# Patient Record
Sex: Male | Born: 1985 | Race: White | Hispanic: No
Health system: Midwestern US, Community
[De-identification: ages and names within clinical notes are randomized; demographics above are authoritative.]

## PROBLEM LIST (undated history)

## (undated) DIAGNOSIS — I639 Cerebral infarction, unspecified: Secondary | ICD-10-CM

## (undated) DIAGNOSIS — I10 Essential (primary) hypertension: Principal | ICD-10-CM

## (undated) DIAGNOSIS — M24419 Recurrent dislocation, unspecified shoulder: Secondary | ICD-10-CM

## (undated) DIAGNOSIS — Z87898 Personal history of other specified conditions: Secondary | ICD-10-CM

## (undated) DIAGNOSIS — F609 Personality disorder, unspecified: Secondary | ICD-10-CM

## (undated) DIAGNOSIS — R569 Unspecified convulsions: Secondary | ICD-10-CM

## (undated) DIAGNOSIS — R413 Other amnesia: Secondary | ICD-10-CM

## (undated) DIAGNOSIS — Z765 Malingerer [conscious simulation]: Secondary | ICD-10-CM

## (undated) DIAGNOSIS — Z8669 Personal history of other diseases of the nervous system and sense organs: Secondary | ICD-10-CM

## (undated) DIAGNOSIS — F4310 Post-traumatic stress disorder, unspecified: Secondary | ICD-10-CM

## (undated) DIAGNOSIS — I2510 Atherosclerotic heart disease of native coronary artery without angina pectoris: Secondary | ICD-10-CM

## (undated) DIAGNOSIS — F1910 Other psychoactive substance abuse, uncomplicated: Secondary | ICD-10-CM

## (undated) HISTORY — PX: APPENDECTOMY: SHX54

---

## 2007-05-31 ENCOUNTER — Emergency Department: Payer: Self-pay

## 2007-06-29 ENCOUNTER — Emergency Department (HOSPITAL_COMMUNITY): Admission: EM | Admit: 2007-06-29 | Discharge: 2007-06-29 | Payer: Self-pay | Admitting: Emergency Medicine

## 2009-01-01 ENCOUNTER — Emergency Department (HOSPITAL_COMMUNITY): Admission: EM | Admit: 2009-01-01 | Discharge: 2009-01-01 | Payer: Self-pay | Admitting: Emergency Medicine

## 2009-01-26 ENCOUNTER — Emergency Department (HOSPITAL_COMMUNITY): Admission: EM | Admit: 2009-01-26 | Discharge: 2009-01-27 | Payer: Self-pay | Admitting: Emergency Medicine

## 2009-01-26 ENCOUNTER — Emergency Department (HOSPITAL_COMMUNITY): Admission: EM | Admit: 2009-01-26 | Discharge: 2009-01-26 | Payer: Self-pay | Admitting: Emergency Medicine

## 2009-02-23 ENCOUNTER — Emergency Department (HOSPITAL_COMMUNITY): Admission: EM | Admit: 2009-02-23 | Discharge: 2009-02-24 | Payer: Self-pay | Admitting: Emergency Medicine

## 2009-03-14 ENCOUNTER — Other Ambulatory Visit: Payer: Self-pay | Admitting: Emergency Medicine

## 2009-03-15 ENCOUNTER — Inpatient Hospital Stay (HOSPITAL_COMMUNITY): Admission: AD | Admit: 2009-03-15 | Discharge: 2009-03-16 | Payer: Self-pay | Admitting: Psychiatry

## 2009-03-15 ENCOUNTER — Ambulatory Visit: Payer: Self-pay | Admitting: Psychiatry

## 2009-03-16 ENCOUNTER — Emergency Department (HOSPITAL_COMMUNITY): Admission: EM | Admit: 2009-03-16 | Discharge: 2009-03-16 | Payer: Self-pay | Admitting: Emergency Medicine

## 2009-03-17 ENCOUNTER — Other Ambulatory Visit: Payer: Self-pay | Admitting: Emergency Medicine

## 2009-03-18 ENCOUNTER — Other Ambulatory Visit: Payer: Self-pay | Admitting: Emergency Medicine

## 2009-03-18 ENCOUNTER — Inpatient Hospital Stay (HOSPITAL_COMMUNITY): Admission: AD | Admit: 2009-03-18 | Discharge: 2009-03-23 | Payer: Self-pay | Admitting: Psychiatry

## 2009-04-12 ENCOUNTER — Emergency Department (HOSPITAL_COMMUNITY): Admission: EM | Admit: 2009-04-12 | Discharge: 2009-04-12 | Payer: Self-pay | Admitting: Emergency Medicine

## 2009-09-16 ENCOUNTER — Emergency Department (HOSPITAL_COMMUNITY): Admission: EM | Admit: 2009-09-16 | Discharge: 2009-09-16 | Payer: Self-pay | Admitting: Emergency Medicine

## 2010-11-05 LAB — POCT I-STAT, CHEM 8
BUN: 18 mg/dL (ref 6–23)
BUN: 12 mg/dL (ref 6–23)
Chloride: 105 mEq/L (ref 96–112)
Creatinine, Ser: 1 mg/dL (ref 0.4–1.5)
Creatinine, Ser: 1.1 mg/dL (ref 0.4–1.5)
HCT: 43 % (ref 39.0–52.0)
Hemoglobin: 14.6 g/dL (ref 13.0–17.0)
Potassium: 3.8 mEq/L (ref 3.5–5.1)
Sodium: 137 mEq/L (ref 135–145)

## 2010-11-05 LAB — ETHANOL: Alcohol, Ethyl (B): <5 mg/dL (ref 0–10)

## 2010-11-06 LAB — CBC
HCT: 42.7 % (ref 39.0–52.0)
Hemoglobin: 14.5 g/dL (ref 13.0–17.0)
Hemoglobin: 15 g/dL (ref 13.0–17.0)
MCHC: 34 g/dL (ref 30.0–36.0)
MCHC: 34.4 g/dL (ref 30.0–36.0)
MCV: 93.8 fL (ref 78.0–100.0)
Platelets: 196 10*3/uL (ref 150–400)
Platelets: 210 10*3/uL (ref 150–400)
RBC: 4.55 MIL/uL (ref 4.22–5.81)
RDW: 13.5 % (ref 11.5–15.5)
RDW: 14.1 % (ref 11.5–15.5)
WBC: 5.7 K/uL (ref 4.0–10.5)
WBC: 8.1 10*3/uL (ref 4.0–10.5)

## 2010-11-06 LAB — URINALYSIS, ROUTINE W REFLEX MICROSCOPIC
Bilirubin Urine: NEGATIVE
Hgb urine dipstick: NEGATIVE
Ketones, ur: NEGATIVE mg/dL
Nitrite: NEGATIVE
Specific Gravity, Urine: 1.03 (ref 1.005–1.030)
pH: 5.5 (ref 5.0–8.0)

## 2010-11-06 LAB — GLUCOSE, CAPILLARY: Glucose-Capillary: 99 mg/dL (ref 70–99)

## 2010-11-06 LAB — BASIC METABOLIC PANEL
BUN: 13 mg/dL (ref 6–23)
Chloride: 107 mEq/L (ref 96–112)
Glucose, Bld: 110 mg/dL — ABNORMAL HIGH (ref 70–99)
Potassium: 4 mEq/L (ref 3.5–5.1)
Sodium: 138 mEq/L (ref 135–145)

## 2010-11-06 LAB — RAPID URINE DRUG SCREEN, HOSP PERFORMED
Amphetamines: NOT DETECTED
Amphetamines: NOT DETECTED
Barbiturates: NOT DETECTED
Barbiturates: NOT DETECTED
Benzodiazepines: NOT DETECTED
Benzodiazepines: POSITIVE — AB
Cocaine: NOT DETECTED
Opiates: NOT DETECTED
Tetrahydrocannabinol: NOT DETECTED

## 2010-11-06 LAB — COMPREHENSIVE METABOLIC PANEL
ALT: 16 U/L (ref 0–53)
CO2: 26 mEq/L (ref 19–32)
Chloride: 108 mEq/L (ref 96–112)
Glucose, Bld: 96 mg/dL (ref 70–99)
Potassium: 3.9 mEq/L (ref 3.5–5.1)
Sodium: 140 mEq/L (ref 135–145)
Total Bilirubin: 0.1 mg/dL — ABNORMAL LOW (ref 0.3–1.2)

## 2010-11-06 LAB — BASIC METABOLIC PANEL WITH GFR
CO2: 24 meq/L (ref 19–32)
Calcium: 9.1 mg/dL (ref 8.4–10.5)
Creatinine, Ser: 1.21 mg/dL (ref 0.4–1.5)

## 2010-11-06 LAB — DIFFERENTIAL
Basophils Relative: 0 % (ref 0–1)
Eosinophils Relative: 2 % (ref 0–5)
Lymphocytes Relative: 29 % (ref 12–46)
Lymphs Abs: 2.3 10*3/uL (ref 0.7–4.0)
Monocytes Relative: 7 % (ref 3–12)
Neutro Abs: 5 10*3/uL (ref 1.7–7.7)

## 2010-11-06 LAB — ETHANOL
Alcohol, Ethyl (B): <5 mg/dL (ref 0–10)
Alcohol, Ethyl (B): <5 mg/dL (ref 0–10)

## 2010-11-06 LAB — SALICYLATE LEVEL: Salicylate Lvl: <4 mg/dL (ref 2.8–20.0)

## 2010-11-07 LAB — POCT I-STAT, CHEM 8
BUN: 10 mg/dL (ref 6–23)
Chloride: 107 mEq/L (ref 96–112)
Creatinine, Ser: 1 mg/dL (ref 0.4–1.5)
Hemoglobin: 16 g/dL (ref 13.0–17.0)

## 2010-11-07 LAB — DIFFERENTIAL
Basophils Absolute: 0 10*3/uL (ref 0.0–0.1)
Eosinophils Absolute: 0.1 10*3/uL (ref 0.0–0.7)
Lymphs Abs: 1.9 10*3/uL (ref 0.7–4.0)
Monocytes Absolute: 0.6 10*3/uL (ref 0.1–1.0)
Monocytes Relative: 10 % (ref 3–12)

## 2010-11-07 LAB — RAPID URINE DRUG SCREEN, HOSP PERFORMED
Amphetamines: NOT DETECTED
Opiates: NOT DETECTED
Tetrahydrocannabinol: POSITIVE — AB

## 2010-11-07 LAB — CBC
HCT: 44 % (ref 39.0–52.0)
Hemoglobin: 15 g/dL (ref 13.0–17.0)
MCV: 93.2 fL (ref 78.0–100.0)
WBC: 5.6 10*3/uL (ref 4.0–10.5)

## 2010-11-07 LAB — ETHANOL: Alcohol, Ethyl (B): <5 mg/dL (ref 0–10)

## 2010-11-08 LAB — POCT CARDIAC MARKERS
CKMB, poc: <1 ng/mL — ABNORMAL LOW (ref 1.0–8.0)
Myoglobin, poc: 74.9 ng/mL (ref 12–200)
Troponin i, poc: <0.05 ng/mL (ref 0.00–0.09)

## 2010-11-08 LAB — DIFFERENTIAL
Eosinophils Absolute: 0.1 10*3/uL (ref 0.0–0.7)
Eosinophils Relative: 2 % (ref 0–5)
Lymphocytes Relative: 38 % (ref 12–46)
Lymphs Abs: 2.5 10*3/uL (ref 0.7–4.0)
Monocytes Relative: 6 % (ref 3–12)

## 2010-11-08 LAB — COMPREHENSIVE METABOLIC PANEL
ALT: 20 U/L (ref 0–53)
AST: 15 U/L (ref 0–37)
Albumin: 4.5 g/dL (ref 3.5–5.2)
Calcium: 9.2 mg/dL (ref 8.4–10.5)
GFR calc Af Amer: >60 mL/min (ref >60)
Sodium: 138 mEq/L (ref 135–145)
Total Protein: 7.6 g/dL (ref 6.0–8.3)

## 2010-11-08 LAB — PROTIME-INR: INR: 1 (ref 0.00–1.49)

## 2010-11-08 LAB — CBC
MCHC: 34.6 g/dL (ref 30.0–36.0)
Platelets: 205 10*3/uL (ref 150–400)
RBC: 4.89 MIL/uL (ref 4.22–5.81)
RDW: 13.5 % (ref 11.5–15.5)

## 2010-11-08 LAB — RAPID URINE DRUG SCREEN, HOSP PERFORMED
Barbiturates: NOT DETECTED
Opiates: POSITIVE — AB
Tetrahydrocannabinol: NOT DETECTED

## 2010-12-14 NOTE — H&P (Signed)
Alex Sandoval, CANSLER NO.:  [id number]   MEDICAL RECORD NO.:  [id number]          PATIENT TYPE:  IPS   LOCATION:  0503                          FACILITY:  [facility]   PHYSICIAN:  Geoffery Lyons, M.D.      DATE OF BIRTH:  September 03, 1985   DATE OF ADMISSION:  03/18/2009  DATE OF DISCHARGE:                       PSYCHIATRIC ADMISSION ASSESSMENT   This is a 25 year old male, involuntarily petitioned on March 18, 2009.   HISTORY OF PRESENT ILLNESS:  The patient is here on the petition with  papers stating that the patient is 25 year old male, presenting to the  emergency department after an overdose of Seroquel in attempted suicide.  The patient also abuses drugs and is considered a danger to himself and  others.  The patient states that he is here today, as he states he took  some extra Seroquel only to sleep.  His girlfriend found him, had  difficulty awakening him, and was taken to the emergency department.  The patient does states that he was here recently, discharged in the  middle of August.  He was here for some alcohol and drug use at that  time.  He states he immediately relapsed.  He states drug dealer was  outside and again relapsed on alcohol and cocaine.   PAST PSYCHIATRIC HISTORY:  The patient was in our facility on March 15, 2009, discharged shortly afterwards, again relapsed immediately.   SOCIAL HISTORY:  He is a 25 year old male.  He states he has a  girlfriend.  The patient was in the [employer], but states he was  discharged.  He is unemployed.  He is a Engineer, agricultural.   FAMILY HISTORY:  Unknown.   ALCOHOL AND DRUG HISTORY:  As above, relapsed on substances.   PRIMARY CARE Draya Felker:  None.   MEDICAL PROBLEMS:  Denies any acute or chronic health issues.   MEDICATIONS:  He was discharged on Seroquel and lithium, which he felt  was beneficial to help him with his bipolar disorder.  He is anxious to  get back on his medications.   DRUG ALLERGIES:   No known allergies.   PHYSICAL EXAMINATION:  GENERAL:  This is a healthy-appearing male, fully  assessed at [facility] Emergency Department.  VITAL SIGNS:  Show a temperature of 98.4, heart rate 112, respirations  18, blood pressure is 139/85.   LABORATORY DATA:  Shows a CBC within normal limits.  Alcohol level less  than 5.  C-MET within normal limits.  Lithium level was less than 0.25.  Urine drug screen is positive for benzodiazepines, positive for THC.  Urinalysis shows a WBC count of 7 to 10.   MENTAL STATUS EXAMINATION:  The patient is fully alert and cooperative,  neat in appearance, casually dressed, good eye contact.  Speech is  clear, normal pace and tone.  The patient's mood is neutral.  He is  anxious to discuss the situation and get back on his medications.  He is  pleasant, wanting to get help.  Thought processes are coherent, goal  directed.  Cognitive function intact.  His memory appears intact.  Judgment and  insight appear to be good.   DIAGNOSES:  AXIS I:  Polysubstance abuse, rule out dependence.  Mood  disorder, rule out bipolar.  AXIS II:  Deferred.  AXIS III:  No known medical conditions.  AXIS IV:  Deferred at this time.  AXIS V:  Current is 35 to 40.   PLAN:  Contract for safety.  Will work on relapse prevention.  We will  continue with the patient's lithium and Seroquel.  Reinforce med  compliance.  We will have Librium available on a p.r.n. basis for any  withdrawal symptoms.  Will continue to identify his support group and  assess the patient's potential for rehab.  His tentative length of stay  is 3 to 5 days.      Landry Corporal, N.P.      Geoffery Lyons, M.D.  Electronically Signed    JO/MEDQ  D:  03/20/2009  T:  03/20/2009  Job:  [id number]
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
## 2010-12-24 ENCOUNTER — Emergency Department (HOSPITAL_COMMUNITY)
Admission: EM | Admit: 2010-12-24 | Discharge: 2010-12-25 | Discharge status: Against Medical Advice | Payer: Self-pay | Attending: Emergency Medicine | Admitting: Emergency Medicine

## 2010-12-24 DIAGNOSIS — G7109 Other specified muscular dystrophies: Secondary | ICD-10-CM | POA: Insufficient documentation

## 2011-09-03 ENCOUNTER — Emergency Department (HOSPITAL_COMMUNITY): Payer: Self-pay

## 2011-09-03 ENCOUNTER — Emergency Department (HOSPITAL_COMMUNITY)
Admission: EM | Admit: 2011-09-03 | Discharge: 2011-09-03 | Disposition: A | Discharge status: Home / Self Care | Payer: Self-pay | Attending: Emergency Medicine | Admitting: Emergency Medicine

## 2011-09-03 ENCOUNTER — Encounter (HOSPITAL_COMMUNITY): Payer: Self-pay | Admitting: *Deleted

## 2011-09-03 DIAGNOSIS — Z79899 Other long term (current) drug therapy: Secondary | ICD-10-CM | POA: Insufficient documentation

## 2011-09-03 DIAGNOSIS — F10929 Alcohol use, unspecified with intoxication, unspecified: Secondary | ICD-10-CM

## 2011-09-03 DIAGNOSIS — F1010 Alcohol abuse, uncomplicated: Secondary | ICD-10-CM | POA: Insufficient documentation

## 2011-09-03 DIAGNOSIS — R45851 Suicidal ideations: Secondary | ICD-10-CM | POA: Insufficient documentation

## 2011-09-03 DIAGNOSIS — Z046 Encounter for general psychiatric examination, requested by authority: Secondary | ICD-10-CM | POA: Insufficient documentation

## 2011-09-03 DIAGNOSIS — F39 Unspecified mood [affective] disorder: Secondary | ICD-10-CM

## 2011-09-03 DIAGNOSIS — F17200 Nicotine dependence, unspecified, uncomplicated: Secondary | ICD-10-CM | POA: Insufficient documentation

## 2011-09-03 DIAGNOSIS — F419 Anxiety disorder, unspecified: Secondary | ICD-10-CM

## 2011-09-03 DIAGNOSIS — F411 Generalized anxiety disorder: Secondary | ICD-10-CM | POA: Insufficient documentation

## 2011-09-03 DIAGNOSIS — F4310 Post-traumatic stress disorder, unspecified: Secondary | ICD-10-CM | POA: Insufficient documentation

## 2011-09-03 DIAGNOSIS — R45850 Homicidal ideations: Secondary | ICD-10-CM | POA: Insufficient documentation

## 2011-09-03 HISTORY — DX: Post-traumatic stress disorder, unspecified: F43.10

## 2011-09-03 HISTORY — DX: Unspecified convulsions: R56.9

## 2011-09-03 LAB — CBC
HCT: 42.4 % (ref 39.0–52.0)
Hemoglobin: 14.9 g/dL (ref 13.0–17.0)
MCV: 89.5 fL (ref 78.0–100.0)
RDW: 14 % (ref 11.5–15.5)
WBC: 15.5 10*3/uL — ABNORMAL HIGH (ref 4.0–10.5)

## 2011-09-03 LAB — DIFFERENTIAL
Basophils Absolute: 0.1 10*3/uL (ref 0.0–0.1)
Eosinophils Relative: 1 % (ref 0–5)
Lymphocytes Relative: 17 % (ref 12–46)
Lymphs Abs: 2.6 10*3/uL (ref 0.7–4.0)
Monocytes Absolute: 0.7 10*3/uL (ref 0.1–1.0)
Monocytes Relative: 5 % (ref 3–12)
Neutro Abs: 11.9 10*3/uL — ABNORMAL HIGH (ref 1.7–7.7)

## 2011-09-03 LAB — ETHANOL: Alcohol, Ethyl (B): 141 mg/dL — ABNORMAL HIGH (ref 0–11)

## 2011-09-03 LAB — BASIC METABOLIC PANEL
Chloride: 102 mEq/L (ref 96–112)
Creatinine, Ser: 0.83 mg/dL (ref 0.50–1.35)
GFR calc Af Amer: >90 mL/min (ref >90)
GFR calc non Af Amer: >90 mL/min (ref >90)
Potassium: 3.8 mEq/L (ref 3.5–5.1)

## 2011-09-03 LAB — RAPID URINE DRUG SCREEN, HOSP PERFORMED
Barbiturates: NOT DETECTED
Benzodiazepines: NOT DETECTED

## 2011-09-03 MED ORDER — LORAZEPAM 1 MG PO TABS
1.0000 mg | ORAL_TABLET | Freq: Three times a day (TID) | ORAL | Status: DC | PRN
  Administered 2011-09-03: 1 mg via ORAL
  Filled 2011-09-03: qty 1

## 2011-09-03 MED ORDER — LORAZEPAM 1 MG PO TABS: 0.5000 mg | ORAL_TABLET | Freq: Three times a day (TID) | ORAL | Status: AC | PRN

## 2011-09-03 MED ORDER — LORAZEPAM 2 MG/ML IJ SOLN
INTRAMUSCULAR | Status: AC
  Filled 2011-09-03: qty 1

## 2011-09-03 MED ORDER — NICOTINE 21 MG/24HR TD PT24: 21.0000 mg | MEDICATED_PATCH | Freq: Every day | TRANSDERMAL | Status: DC | PRN

## 2011-09-03 MED ORDER — LORAZEPAM 1 MG PO TABS: 1.0000 mg | ORAL_TABLET | Freq: Once | ORAL | Status: DC

## 2011-09-03 MED ORDER — ONDANSETRON HCL 4 MG PO TABS: 4.0000 mg | ORAL_TABLET | Freq: Three times a day (TID) | ORAL | Status: DC | PRN

## 2011-09-03 MED ORDER — ZIPRASIDONE MESYLATE 20 MG IM SOLR
INTRAMUSCULAR | Status: AC
  Administered 2011-09-03: 02:00:00
  Filled 2011-09-03: qty 20

## 2011-09-03 MED ORDER — ZIPRASIDONE MESYLATE 20 MG IM SOLR: 20.0000 mg | Freq: Four times a day (QID) | INTRAMUSCULAR | Status: DC | PRN

## 2011-09-03 MED ORDER — ACETAMINOPHEN 325 MG PO TABS
650.0000 mg | ORAL_TABLET | ORAL | Status: DC | PRN
  Administered 2011-09-03: 650 mg via ORAL
  Filled 2011-09-03: qty 2

## 2011-09-03 MED ORDER — ZOLPIDEM TARTRATE 5 MG PO TABS: 5.0000 mg | ORAL_TABLET | Freq: Every evening | ORAL | Status: DC | PRN

## 2011-09-03 MED ORDER — IBUPROFEN 600 MG PO TABS
600.0000 mg | ORAL_TABLET | Freq: Three times a day (TID) | ORAL | Status: DC | PRN
  Administered 2011-09-03: 600 mg via ORAL
  Filled 2011-09-03: qty 1

## 2011-09-03 NOTE — ED Notes (Signed)
telepsych in progress, pt tolerating well 

## 2011-09-03 NOTE — ED Notes (Signed)
Pt reports that his neurologist has recently done levels on all of his medications and declined blood work at this time.

## 2011-09-03 NOTE — ED Notes (Signed)
Talking w/ [facility] 

## 2011-09-03 NOTE — ED Notes (Addendum)
Pharm tech reports that she contacted the pharmacy that the pt reported he used  And  They have not filled the meds listed.  Last filled at that pharmacy were zyprexa x14 days (08/18/11), zoloft and naltrexone (10/12)
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
## 2011-09-03 NOTE — ED Notes (Signed)
Dr Norva Karvonen aware that pt has declined lab work

## 2011-09-03 NOTE — ED Notes (Signed)
Pt dc'd w/ written instructions and prescriptions x1.  Pt encouraged to avoid ETOH, and return or seek help for any recurance of thoughts/urges to harm his self others.  Pt verbalized understanding.
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
## 2011-09-03 NOTE — ED Notes (Signed)
Pharmacy tech here to speak w/ pt

## 2011-09-03 NOTE — ED Notes (Signed)
Up in room talking w/ [employer] officers, waiting for EDP to DC.

## 2011-09-03 NOTE — ED Notes (Signed)
Belongings returned

## 2011-09-03 NOTE — ED Provider Notes (Signed)
History     CSN: [id number]  Arrival date & time 09/03/11  0150   First MD Initiated Contact with Patient 09/03/11 [phone]      Chief Complaint  Patient presents with  . Medical Clearance  . Suicidal  . Homicidal    (Consider location/radiation/quality/duration/timing/severity/associated sxs/prior treatment) HPI  Past Medical History  Diagnosis Date  . Glioblastoma   . Seizures   . Post traumatic stress disorder     Past Surgical History  Procedure Date  . Appendectomy     History reviewed. No pertinent family history.  History  Substance Use Topics  . Smoking status: Current Everyday Smoker -- 1.0 packs/day    Types: Cigarettes  . Smokeless tobacco: Not on file  . Alcohol Use: Yes      Review of Systems  Allergies  Benadryl  Home Medications   Current Outpatient Rx  Name Route Sig Dispense Refill  . DIVALPROEX SODIUM 250 MG PO TBEC Oral Take 250 mg by mouth 3 (three) times daily.    Marland Kitchen LEVETIRACETAM 250 MG PO TABS Oral Take 250 mg by mouth every 12 (twelve) hours.    Marland Kitchen PHENYTOIN SODIUM EXTENDED 100 MG PO CAPS Oral Take 500 mg by mouth 2 (two) times daily.      BP 121/71  Pulse 91  Temp(Src) 98.3 F (36.8 C) (Oral)  Resp 18  SpO2 96%  Physical Exam  ED Course  Procedures (including critical care time)  Labs Reviewed  BASIC METABOLIC PANEL - Abnormal; Notable for the following:    Glucose, Bld 100 (*)    All other components within normal limits  ETHANOL - Abnormal; Notable for the following:    Alcohol, Ethyl (B) 141 (*)    All other components within normal limits  CBC - Abnormal; Notable for the following:    WBC 15.5 (*)    All other components within normal limits  DIFFERENTIAL - Abnormal; Notable for the following:    Neutro Abs 11.9 (*)    All other components within normal limits  URINE RAPID DRUG SCREEN (HOSP PERFORMED)   Ct Head Wo Contrast  09/03/2011  *RADIOLOGY REPORT*  Clinical Data: Altered mental status  CT HEAD WITHOUT  CONTRAST  Technique:  Contiguous axial images were obtained from the base of the skull through the vertex without contrast.  Comparison: 08/08/2009  Findings: There is no evidence for acute hemorrhage, hydrocephalus, mass lesion, or abnormal extra-axial fluid collection.  No definite CT evidence for acute infarction.  Partially imaged mucosal thickening on the right.  Otherwise, the visualized paranasal sinuses and mastoid air cells are predominately clear.  No displaced calvarial fracture.  IMPRESSION: No acute intracranial abnormality  Original Report Authenticated By: Waneta Martins, M.D.     1. Suicidal ideation    Results for orders placed during the hospital encounter of 09/03/11  BASIC METABOLIC PANEL      Component Value Range   Sodium 137  135 - 145 (mEq/L)   Potassium 3.8  3.5 - 5.1 (mEq/L)   Chloride 102  96 - 112 (mEq/L)   CO2 21  19 - 32 (mEq/L)   Glucose, Bld 100 (*) 70 - 99 (mg/dL)   BUN 13  6 - 23 (mg/dL)   Creatinine, Ser 1.19  0.50 - 1.35 (mg/dL)   Calcium 9.3  8.4 - 14.7 (mg/dL)   GFR calc non Af Amer >90  >90 (mL/min)   GFR calc Af Amer >90  >90 (mL/min)  ETHANOL  Component Value Range   Alcohol, Ethyl (B) 141 (*) 0 - 11 (mg/dL)  URINE RAPID DRUG SCREEN (HOSP PERFORMED)      Component Value Range   Opiates NONE DETECTED  NONE DETECTED    Cocaine NONE DETECTED  NONE DETECTED    Benzodiazepines NONE DETECTED  NONE DETECTED    Amphetamines NONE DETECTED  NONE DETECTED    Tetrahydrocannabinol NONE DETECTED  NONE DETECTED    Barbiturates NONE DETECTED  NONE DETECTED   CBC      Component Value Range   WBC 15.5 (*) 4.0 - 10.5 (K/uL)   RBC 4.74  4.22 - 5.81 (MIL/uL)   Hemoglobin 14.9  13.0 - 17.0 (g/dL)   HCT 40.9  81.1 - 91.4 (%)   MCV 89.5  78.0 - 100.0 (fL)   MCH 31.4  26.0 - 34.0 (pg)   MCHC 35.1  30.0 - 36.0 (g/dL)   RDW 78.2  95.6 - 21.3 (%)   Platelets 237  150 - 400 (K/uL)  DIFFERENTIAL      Component Value Range   Neutrophils Relative 77  43 -  77 (%)   Neutro Abs 11.9 (*) 1.7 - 7.7 (K/uL)   Lymphocytes Relative 17  12 - 46 (%)   Lymphs Abs 2.6  0.7 - 4.0 (K/uL)   Monocytes Relative 5  3 - 12 (%)   Monocytes Absolute 0.7  0.1 - 1.0 (K/uL)   Eosinophils Relative 1  0 - 5 (%)   Eosinophils Absolute 0.2  0.0 - 0.7 (K/uL)   Basophils Relative 0  0 - 1 (%)   Basophils Absolute 0.1  0.0 - 0.1 (K/uL)   Ct Head Wo Contrast  09/03/2011  *RADIOLOGY REPORT*  Clinical Data: Altered mental status  CT HEAD WITHOUT CONTRAST  Technique:  Contiguous axial images were obtained from the base of the skull through the vertex without contrast.  Comparison: 08/08/2009  Findings: There is no evidence for acute hemorrhage, hydrocephalus, mass lesion, or abnormal extra-axial fluid collection.  No definite CT evidence for acute infarction.  Partially imaged mucosal thickening on the right.  Otherwise, the visualized paranasal sinuses and mastoid air cells are predominately clear.  No displaced calvarial fracture.  IMPRESSION: No acute intracranial abnormality  Original Report Authenticated By: Waneta Martins, M.D.       MDM  Act eval and telepsych consult.  telepsych md, Trisha Mangle, has evaluated. Pt no longer intoxicated. Calm, cooperative. Alert. Denies any intent to harm self or others incl former signif other/friend. States he got upset, was drunk, expresses regret at what he may have said in and around arrival to ED, but states will follow up with his doctor/therapist. Pt requests small quantity rx ativan for home.   Recheck pt second time, normal mood/affect. No hostility. No thoughts of harm to self/others. Optimistic about d/c from ed and f/u plan.         Suzi Roots, MD 09/03/11 [phone]

## 2011-09-03 NOTE — BH Assessment (Signed)
Assessment Note   Alex Sandoval is an 26 y.o. male. PT WAS BROUGHT IN BY [employer] AFTER HE HAD BEEN FOUND INTOXICATED, VOICING HOMOICIDAL, SUICIDAL IDEATION & EXPRESSING AGGRESSIVE VERBAL BEHAVIOR. PT WAS PETITION BY [employer] AT BROUGHT TO ED FOR PLACEMENT. PT DURING ASSESSMENT HAD EXPRESSED THAT HE COULD NOT REMEMBER WHY HE WAS HERE & WHEN CLINICIAN DID INQUIRED WHY HE WAS FOUND IN A TUX MAKING THREATS. PT HAD SAYS HE HAD PLANS WHICH HE DID NOT WANT TO TALK ABOUT & GREW INCREASLY AGITATED WHEN HE FOUND OUT HE WAS PETITIONED. PT EXPRESSED IN PROFANE WORDS [facility] & [facility]; PT GREW INCREASLY ANGRY STATING HE WAS GOING TO CALL LAWYER & WAS ONLY GIVING 72 HRS TO FIND HIM PLACEMENT OR HE WAS GOING TO CALL HIS LAWYERS. THE GOT UP FROM THE ASSESSMENT & EXPRESSED THAT HE WAS NO LONGER GOING TO ANSWER ANYMORE QUESTION AFTER STATING SEVERAL TIMES THAT NO ONE HAD A RIGHT TO HOLD OR PETITION HIM CAUSE HE WAS NOT AN ENDANGER TO SELF OR OTHER & HAD SAID THOSE THINGS OUT OF ANGER( VOICE ESCALATING) PT HAD THREATENED TO WALK OUT OF THE [facility] ED BY KICKING THE DOOR DOWN. PT TOLD CLINICIAN THAT SHE BETTER GO GET [facility] & COME GET HIM. [employer] DID COME & TRY TO SPEAK WITH PT & DE-ESCALATE THE SITUATION BUT PT JUST GOT MORE UPSET. PT AGREED TO A TELEPSYCH & MEDS WERE ALSO ADMINISTERED. PT WAS REFERRED TO [facility] FOR A 400 HALL BED & [facility] FOR ADMISSION. PT IS PENDING DISPOSITION  Axis I: Mood Disorder NOS Axis II: Deferred Axis III:  Past Medical History  Diagnosis Date  . Glioblastoma   . Seizures   . Post traumatic stress disorder    Axis IV: other psychosocial or environmental problems and problems related to social environment Axis V: 21-30 behavior considerably influenced by delusions or hallucinations OR serious impairment in judgment, communication OR inability to function in almost all areas  Past Medical History:  Past Medical History  Diagnosis  Date  . Glioblastoma   . Seizures   . Post traumatic stress disorder     Past Surgical History  Procedure Date  . Appendectomy     Family History: History reviewed. No pertinent family history.  Social History:  reports that he has been smoking Cigarettes.  He has been smoking about 1 pack per day. He does not have any smokeless tobacco history on file. He reports that he drinks alcohol. He reports that he does not use illicit drugs.  Additional Social History:    Allergies:  Allergies  Allergen Reactions  . Benadryl (Diphenhydramine Hcl) Anaphylaxis    Home Medications:  Medications Prior to Admission  Medication Dose Route Frequency Provider Last Rate Last Dose  . acetaminophen (TYLENOL) tablet 650 mg  650 mg Oral Q4H PRN Flint Melter, MD   650 mg at 09/03/11 4540  . ibuprofen (ADVIL,MOTRIN) tablet 600 mg  600 mg Oral Q8H PRN Flint Melter, MD   600 mg at 09/03/11 9811  . LORazepam (ATIVAN) 2 MG/ML injection           . LORazepam (ATIVAN) tablet 1 mg  1 mg Oral Q8H PRN Flint Melter, MD   1 mg at 09/03/11 9147  . LORazepam (ATIVAN) tablet 1 mg  1 mg Oral Once Suzi Roots, MD      . nicotine (NICODERM  CQ - dosed in mg/24 hours) patch 21 mg  21 mg Transdermal Daily PRN Flint Melter, MD      . ondansetron [facility]) tablet 4 mg  4 mg Oral Q8H PRN Flint Melter, MD      . ziprasidone (GEODON) 20 MG injection           . ziprasidone (GEODON) injection 20 mg  20 mg Intramuscular Q6H PRN Flint Melter, MD      . zolpidem (AMBIEN) tablet 5 mg  5 mg Oral QHS PRN Flint Melter, MD       No current outpatient prescriptions on file as of 09/03/2011.    OB/GYN Status:  No LMP for male patient.  General Assessment Data [facility] of [facility]: [facility] Living Arrangements: Relatives;Non-Relatives Can pt return to current living arrangement?: Yes Admission Status: Involuntary Is patient capable of signing voluntary admission?: No Transfer from: Acute Hospital Referral  Source: Other     Risk to self Suicidal Ideation: Yes-Currently Present Suicidal Intent: Yes-Currently Present Is patient at risk for suicide?: Yes Suicidal Plan?: No Access to Means: No What has been your use of drugs/alcohol within the last 12 months?: PT WAS INTOXICATED WHEN PICKED UP BY [employer]; ETOH & THC POSSIBLE WHICH PT REFUSES TO DISCLOSE Previous Attempts/Gestures: Yes How many times?: 3  Other Self Harm Risks: NA Triggers for Past Attempts: Unpredictable;Other (Comment) (RELATIONSHIP ISSUES) Intentional Self Injurious Behavior: None Family Suicide History: Unknown Recent stressful life event(s): Conflict (Comment);Job Loss;Other (Comment) (FIANCE LEFT HIM AT THE ALTER) Persecutory voices/beliefs?: No Depression: Yes Depression Symptoms: Feeling angry/irritable;Loss of interest in usual pleasures Substance abuse history and/or treatment for substance abuse?: No Suicide prevention information given to non-admitted patients: Not applicable  Risk to Others Homicidal Ideation: Yes-Currently Present Thoughts of Harm to Others: Yes-Currently Present Comment - Thoughts of Harm to Others: WILL KILL THAT MOTHER FUCKER Current Homicidal Intent: Yes-Currently Present Current Homicidal Plan: No Access to Homicidal Means: Yes Describe Access to Homicidal Means: SHARP OBJECTS Identified Victim: NA History of harm to others?: Yes Assessment of Violence: None Noted Violent Behavior Description: AGITATED & VERBALLY AGGRESSIVE Does patient have access to weapons?: Yes (Comment) Criminal Charges Pending?: Yes Describe Pending Criminal Charges: unk Does patient have a court date: No  Psychosis Hallucinations: None noted Delusions: None noted  Mental Status Report Appear/Hygiene: Improved Eye Contact: Fair Motor Activity: Agitation;Mannerisms;Freedom of movement Speech: Aggressive;Logical/coherent;Pressured Level of Consciousness: Alert;Irritable Mood: Depressed Affect:  Angry;Apprehensive;Appropriate to circumstance;Blunted;Depressed;Sad Anxiety Level: None Thought Processes: Coherent;Relevant Judgement: Impaired Orientation: Time;Place;Person;Situation Obsessive Compulsive Thoughts/Behaviors: None  Cognitive Functioning Concentration: Decreased Memory: Recent Intact;Remote Intact IQ: Average Insight: Poor Impulse Control: Poor Appetite: Poor Weight Loss: 0  Weight Gain: 0  Sleep: No Change Total Hours of Sleep: 8  Vegetative Symptoms: None  Prior Inpatient Therapy Prior Inpatient Therapy: Yes Prior Therapy Dates: 2012,2011, 2010, 2009 [facility] Fac[facility]Provider([facility] Reason for Treatment: STABILIZATION  Prior Outpatient Therapy Prior Outpatient Therapy: Yes Prior Therapy Dates: UNK Prior Therapy Facilty/Provider(s): [facility] Reason for Treatment: MED MANAGEMENT                     Additional Information 1:1 In Past 12 Months?: No CIRT Risk: No Elopement Risk: No Does patient have medical clearance?: Yes     Disposition:  Disposition Disposition of Patient: Inpatient treatment program;Referred to [facility] FOR 400 HALL BED) Type of inpatient treatment program: Adult  On Site Evaluation by:   Reviewed with Physician:  Waldron Session 09/03/2011 10:02 AM

## 2011-09-03 NOTE — ED Notes (Signed)
Move of to Psych ED delayed due to [employer] shift change.

## 2011-09-03 NOTE — ED Notes (Signed)
Dr steinl into see 

## 2011-09-03 NOTE — ED Notes (Signed)
Patty and ACT into talk w/ pt

## 2011-09-03 NOTE — ED Notes (Signed)
ACT into see 

## 2011-09-03 NOTE — ED Notes (Signed)
Waiting for pt to be transferred to room

## 2011-09-03 NOTE — ED Notes (Signed)
Pt refuses to answer questions about past medical history, allergies, or safety requests.

## 2011-09-03 NOTE — ED Notes (Signed)
valubles retrieved from the safe and given to pt by security

## 2011-09-03 NOTE — ED Notes (Signed)
Pt presents in police custody w/ IVC papers. Pt has made homicidal and suicidal statements after personal emotional crisis which occurred tonight. Pt is verbally aggressive and making threats toward all staff and police involved in his care. Pt is refusing to comply w/ safety requests by police and staff. EDP witnessed and provided verbal orders.

## 2012-01-21 ENCOUNTER — Encounter (HOSPITAL_COMMUNITY): Payer: Self-pay | Admitting: Emergency Medicine

## 2012-01-21 ENCOUNTER — Emergency Department (HOSPITAL_COMMUNITY)
Admission: EM | Admit: 2012-01-21 | Discharge: 2012-01-22 | Disposition: A | Discharge status: Other Acute Inpatient Hospital | Payer: Self-pay | Attending: *Deleted | Admitting: *Deleted

## 2012-01-21 DIAGNOSIS — F1910 Other psychoactive substance abuse, uncomplicated: Secondary | ICD-10-CM | POA: Insufficient documentation

## 2012-01-21 DIAGNOSIS — F4310 Post-traumatic stress disorder, unspecified: Secondary | ICD-10-CM | POA: Insufficient documentation

## 2012-01-21 DIAGNOSIS — F17200 Nicotine dependence, unspecified, uncomplicated: Secondary | ICD-10-CM | POA: Insufficient documentation

## 2012-01-21 DIAGNOSIS — Z79899 Other long term (current) drug therapy: Secondary | ICD-10-CM | POA: Insufficient documentation

## 2012-01-21 LAB — CBC
HCT: 39.7 % (ref 39.0–52.0)
MCV: 88.4 fL (ref 78.0–100.0)
RDW: 15.3 % (ref 11.5–15.5)
WBC: 5.5 10*3/uL (ref 4.0–10.5)

## 2012-01-21 LAB — COMPREHENSIVE METABOLIC PANEL
Albumin: 4 g/dL (ref 3.5–5.2)
BUN: 17 mg/dL (ref 6–23)
Chloride: 103 mEq/L (ref 96–112)
Creatinine, Ser: 0.93 mg/dL (ref 0.50–1.35)
GFR calc non Af Amer: >90 mL/min (ref >90)
Total Bilirubin: 0.3 mg/dL (ref 0.3–1.2)

## 2012-01-21 LAB — ETHANOL: Alcohol, Ethyl (B): <11 mg/dL (ref 0–11)

## 2012-01-21 LAB — RAPID URINE DRUG SCREEN, HOSP PERFORMED
Amphetamines: NOT DETECTED
Opiates: POSITIVE — AB

## 2012-01-21 MED ORDER — ZOLPIDEM TARTRATE 5 MG PO TABS
5.0000 mg | ORAL_TABLET | Freq: Every evening | ORAL | Status: DC | PRN
  Administered 2012-01-22: 5 mg via ORAL
  Filled 2012-01-21: qty 1

## 2012-01-21 MED ORDER — ACETAMINOPHEN 325 MG PO TABS: 650.0000 mg | ORAL_TABLET | ORAL | Status: DC | PRN

## 2012-01-21 MED ORDER — IBUPROFEN 600 MG PO TABS: 600.0000 mg | ORAL_TABLET | Freq: Three times a day (TID) | ORAL | Status: DC | PRN

## 2012-01-21 MED ORDER — ALUM & MAG HYDROXIDE-SIMETH 200-200-20 MG/5ML PO SUSP: 30.0000 mL | ORAL | Status: DC | PRN

## 2012-01-21 MED ORDER — NICOTINE 21 MG/24HR TD PT24: 21.0000 mg | MEDICATED_PATCH | Freq: Every day | TRANSDERMAL | Status: DC

## 2012-01-21 NOTE — ED Notes (Signed)
Lab bedside to obtain samples 

## 2012-01-21 NOTE — ED Provider Notes (Signed)
History     CSN: [id number]  Arrival date & time 01/21/12  2152   First MD Initiated Contact with Patient 01/21/12 2328      HPI Patient reports a history of substance. States he is using various opioids. Also states he is using Xanax. Reports he is here because he is beginning to have multiple blackouts the acute polysubstance abuse. Like to receive detox. Denies suicidal ideation or homicidal ideation. States his last use was approximately 3 PM reports similar symptoms in 2010; when he had to go to a detox program. The history is provided by the patient.    Past Medical History  Diagnosis Date  . Glioblastoma   . Seizures   . Post traumatic stress disorder     Past Surgical History  Procedure Date  . Appendectomy     No family history on file.  History  Substance Use Topics  . Smoking status: Current Everyday Smoker -- 1.0 packs/day    Types: Cigarettes  . Smokeless tobacco: Not on file  . Alcohol Use: Yes      Review of Systems  Psychiatric/Behavioral: Positive for agitation. Negative for suicidal ideas, hallucinations and self-injury. The patient is not nervous/anxious.   All other systems reviewed and are negative.    Allergies  Benadryl and Penicillins  Home Medications   Current Outpatient Rx  Name Route Sig Dispense Refill  . HYDROMORPHONE HCL 8 MG PO TABS Oral Take 8 mg by mouth every 4 (four) hours as needed. Pain    . MORPHINE SULFATE 15 MG PO TABS Oral Take 15 mg by mouth every 4 (four) hours as needed. Pain      BP 149/78  Pulse 85  Temp 98 F (36.7 C)  Resp 16  Wt 200 lb (90.719 kg)  SpO2 99%  Physical Exam  Constitutional: He is oriented to person, place, and time. He appears well-developed and well-nourished.  HENT:  Head: Normocephalic and atraumatic.  Eyes: Pupils are equal, round, and reactive to light.  Neurological: He is alert and oriented to person, place, and time.  Skin: Skin is warm and dry. No rash noted. No erythema. No  pallor.  Psychiatric: He has a normal mood and affect. His behavior is normal.    ED Course  Procedures   Results for orders placed during the hospital encounter of 01/21/12  CBC      Component Value Range   WBC 5.5  4.0 - 10.5 K/uL   RBC 4.49  4.22 - 5.81 MIL/uL   Hemoglobin 13.0  13.0 - 17.0 g/dL   HCT 78.2  95.6 - 21.3 %   MCV 88.4  78.0 - 100.0 fL   MCH 29.0  26.0 - 34.0 pg   MCHC 32.7  30.0 - 36.0 g/dL   RDW 08.6  57.8 - 46.9 %   Platelets 217  150 - 400 K/uL  COMPREHENSIVE METABOLIC PANEL      Component Value Range   Sodium 137  135 - 145 mEq/L   Potassium 4.6  3.5 - 5.1 mEq/L   Chloride 103  96 - 112 mEq/L   CO2 27  19 - 32 mEq/L   Glucose, Bld 106 (*) 70 - 99 mg/dL   BUN 17  6 - 23 mg/dL   Creatinine, Ser 6.29  0.50 - 1.35 mg/dL   Calcium 9.4  8.4 - 52.8 mg/dL   Total Protein 7.7  6.0 - 8.3 g/dL   Albumin 4.0  3.5 - 5.2 g/dL  AST 16  0 - 37 U/L   ALT 17  0 - 53 U/L   Alkaline Phosphatase 84  39 - 117 U/L   Total Bilirubin 0.3  0.3 - 1.2 mg/dL   GFR calc non Af Amer >90  >90 mL/min   GFR calc Af Amer >90  >90 mL/min  ETHANOL      Component Value Range   Alcohol, Ethyl (B) <11  0 - 11 mg/dL  URINE RAPID DRUG SCREEN (HOSP PERFORMED)      Component Value Range   Opiates POSITIVE (*) NONE DETECTED   Cocaine NONE DETECTED  NONE DETECTED   Benzodiazepines NONE DETECTED  NONE DETECTED   Amphetamines NONE DETECTED  NONE DETECTED   Tetrahydrocannabinol NONE DETECTED  NONE DETECTED   Barbiturates NONE DETECTED  NONE DETECTED    MDM  11:52 PM Spoke with Paige, ACT, who will come evaluate the patient. I have written psych holding orders.       Thomasene Lot, PA-C 01/21/12 2352

## 2012-01-21 NOTE — ED Notes (Signed)
Security @ bedside

## 2012-01-21 NOTE — ED Notes (Signed)
Pt alert, nad, c/o withdrawal from narcotics, requesting detox, denies SI/HI, states last use today, pt states IV drug use

## 2012-01-21 NOTE — ED Notes (Signed)
Pt provided PO nourishment

## 2012-01-21 NOTE — ED Notes (Signed)
Pt provided paper scrubs with instructions 

## 2012-01-21 NOTE — ED Notes (Signed)
Report to [facility]

## 2012-01-22 ENCOUNTER — Inpatient Hospital Stay (HOSPITAL_COMMUNITY)
Admission: AD | Admit: 2012-01-22 | Discharge: 2012-01-24 | DRG: 897 | Disposition: A | Discharge status: Home / Self Care | Payer: No Typology Code available for payment source | Source: Other Acute Inpatient Hospital | Attending: Psychiatry | Admitting: Psychiatry

## 2012-01-22 DIAGNOSIS — F1310 Sedative, hypnotic or anxiolytic abuse, uncomplicated: Secondary | ICD-10-CM | POA: Diagnosis present

## 2012-01-22 DIAGNOSIS — F1210 Cannabis abuse, uncomplicated: Secondary | ICD-10-CM | POA: Diagnosis present

## 2012-01-22 DIAGNOSIS — F1120 Opioid dependence, uncomplicated: Secondary | ICD-10-CM | POA: Diagnosis present

## 2012-01-22 DIAGNOSIS — F1110 Opioid abuse, uncomplicated: Secondary | ICD-10-CM | POA: Diagnosis present

## 2012-01-22 DIAGNOSIS — Z79899 Other long term (current) drug therapy: Secondary | ICD-10-CM

## 2012-01-22 DIAGNOSIS — F1994 Other psychoactive substance use, unspecified with psychoactive substance-induced mood disorder: Principal | ICD-10-CM | POA: Diagnosis present

## 2012-01-22 DIAGNOSIS — F4310 Post-traumatic stress disorder, unspecified: Secondary | ICD-10-CM | POA: Diagnosis present

## 2012-01-22 MED ORDER — LOPERAMIDE HCL 2 MG PO CAPS: 2.0000 mg | ORAL_CAPSULE | ORAL | Status: DC | PRN

## 2012-01-22 MED ORDER — ONDANSETRON 4 MG PO TBDP: 4.0000 mg | ORAL_TABLET | Freq: Four times a day (QID) | ORAL | Status: DC | PRN

## 2012-01-22 MED ORDER — THIAMINE HCL 100 MG/ML IJ SOLN: 100.0000 mg | Freq: Once | INTRAMUSCULAR | Status: DC

## 2012-01-22 MED ORDER — DICYCLOMINE HCL 20 MG PO TABS: 20.0000 mg | ORAL_TABLET | Freq: Four times a day (QID) | ORAL | Status: DC | PRN

## 2012-01-22 MED ORDER — HYDROXYZINE HCL 25 MG PO TABS
25.0000 mg | ORAL_TABLET | Freq: Four times a day (QID) | ORAL | Status: DC | PRN
  Administered 2012-01-22 – 2012-01-24 (×5): 25 mg via ORAL
  Filled 2012-01-22 (×2): qty 1

## 2012-01-22 MED ORDER — MAGNESIUM HYDROXIDE 400 MG/5ML PO SUSP: 30.0000 mL | Freq: Every day | ORAL | Status: DC | PRN

## 2012-01-22 MED ORDER — HYDROXYZINE HCL 25 MG PO TABS: 25.0000 mg | ORAL_TABLET | Freq: Four times a day (QID) | ORAL | Status: DC | PRN

## 2012-01-22 MED ORDER — NAPROXEN 500 MG PO TABS: 500.0000 mg | ORAL_TABLET | Freq: Two times a day (BID) | ORAL | Status: DC | PRN

## 2012-01-22 MED ORDER — VITAMIN B-1 100 MG PO TABS
100.0000 mg | ORAL_TABLET | Freq: Every day | ORAL | Status: DC
  Administered 2012-01-23 – 2012-01-24 (×2): 100 mg via ORAL
  Filled 2012-01-22 (×3): qty 1

## 2012-01-22 MED ORDER — CHLORDIAZEPOXIDE HCL 25 MG PO CAPS
25.0000 mg | ORAL_CAPSULE | Freq: Four times a day (QID) | ORAL | Status: DC | PRN
  Administered 2012-01-24: 25 mg via ORAL
  Filled 2012-01-22: qty 1

## 2012-01-22 MED ORDER — ADULT MULTIVITAMIN W/MINERALS CH
1.0000 | ORAL_TABLET | Freq: Every day | ORAL | Status: DC
  Administered 2012-01-22 – 2012-01-24 (×3): 1 via ORAL
  Filled 2012-01-22 (×4): qty 1

## 2012-01-22 MED ORDER — ALUM & MAG HYDROXIDE-SIMETH 200-200-20 MG/5ML PO SUSP: 30.0000 mL | ORAL | Status: DC | PRN

## 2012-01-22 MED ORDER — CLONIDINE HCL 0.1 MG PO TABS
0.1000 mg | ORAL_TABLET | Freq: Four times a day (QID) | ORAL | Status: AC
  Administered 2012-01-22 – 2012-01-23 (×6): 0.1 mg via ORAL
  Filled 2012-01-22 (×8): qty 1

## 2012-01-22 MED ORDER — ACETAMINOPHEN 325 MG PO TABS
650.0000 mg | ORAL_TABLET | Freq: Four times a day (QID) | ORAL | Status: DC | PRN
Start: 2012-01-22 — End: 2012-01-24

## 2012-01-22 MED ORDER — METHOCARBAMOL 500 MG PO TABS
500.0000 mg | ORAL_TABLET | Freq: Three times a day (TID) | ORAL | Status: DC | PRN
  Administered 2012-01-22 – 2012-01-24 (×4): 500 mg via ORAL
  Filled 2012-01-22 (×4): qty 1

## 2012-01-22 MED ORDER — CHLORDIAZEPOXIDE HCL 25 MG PO CAPS
25.0000 mg | ORAL_CAPSULE | ORAL | Status: DC
  Administered 2012-01-24: 25 mg via ORAL
  Filled 2012-01-22: qty 1

## 2012-01-22 MED ORDER — CHLORDIAZEPOXIDE HCL 25 MG PO CAPS
25.0000 mg | ORAL_CAPSULE | Freq: Three times a day (TID) | ORAL | Status: AC
  Administered 2012-01-23 (×3): 25 mg via ORAL
  Filled 2012-01-22 (×3): qty 1

## 2012-01-22 MED ORDER — CHLORDIAZEPOXIDE HCL 25 MG PO CAPS
25.0000 mg | ORAL_CAPSULE | Freq: Four times a day (QID) | ORAL | Status: AC
  Administered 2012-01-22 (×3): 25 mg via ORAL
  Filled 2012-01-22 (×3): qty 1

## 2012-01-22 MED ORDER — METHOCARBAMOL 500 MG PO TABS: 500.0000 mg | ORAL_TABLET | Freq: Three times a day (TID) | ORAL | Status: DC | PRN

## 2012-01-22 MED ORDER — CHLORDIAZEPOXIDE HCL 25 MG PO CAPS: 25.0000 mg | ORAL_CAPSULE | Freq: Every day | ORAL | Status: DC

## 2012-01-22 MED ORDER — CLONIDINE HCL 0.1 MG PO TABS
0.1000 mg | ORAL_TABLET | ORAL | Status: DC
  Administered 2012-01-22 – 2012-01-24 (×2): 0.1 mg via ORAL
  Filled 2012-01-22 (×3): qty 1

## 2012-01-22 MED ORDER — CLONIDINE HCL 0.1 MG PO TABS: 0.1000 mg | ORAL_TABLET | Freq: Every day | ORAL | Status: DC

## 2012-01-22 MED ORDER — NAPROXEN 500 MG PO TABS
500.0000 mg | ORAL_TABLET | Freq: Two times a day (BID) | ORAL | Status: DC | PRN
  Filled 2012-01-22: qty 1

## 2012-01-22 NOTE — Progress Notes (Signed)
Patient ID: Alex Sandoval, male   DOB: 1986/06/10, 26 y.o.   MRN: [id number]   [facility] Notes:  (Counselor/Nursing/MHT/Case Management/Adjunct)  01/22/2012 1:15 PM  Type of Therapy:  Group Therapy, Dance/Movement Therapy   Participation Level:  Active  Participation Quality:  Appropriate  Affect:  Appropriate  Cognitive:  Appropriate  Insight:  Good  Engagement in Group:  Good  Engagement in Therapy:  Good  Modes of Intervention:  Clarification, Problem-solving, Role-play, Socialization and Support  Summary of Progress/Problems:   Group discussed the "I Am Your Disease" letter. Group members shared how they could relate to the letter and what supports they could use to aid in their recovery. Pt. Shared that recovery can only happen if you really want it. Pt. shared that he went through a difficult time and relapsed but is ready to stay clean.    Cassidi Long

## 2012-01-22 NOTE — BH Assessment (Signed)
Assessment Note   Alex Sandoval is an 26 y.o. male who presents voluntarily to [facility] with request for detox and substance abuse treatment for opiates and Xanax. Pt denies depression but reports depressive symptoms including fatigue and loss of interest. He reports euthymic mood. He endorses minimal anxiety. His affect is appropriate to circumstance. Pt first used morphine at age 81. He uses daily (amount varies) and has used daily for 2 years. Last use was 01/21/12 when he used 4 mg. Pt first used dilaudid at age 48. He uses every other day (amount varies) and has used for 1 year. He used 20 mg on  01/21/12. Pt first used Xanax at age 66. He uses daily (6 to 7 Xanax 2 mg) for past 1.5 years. He last used 01/21/12 when he used 30 mg total. Pt first used alcohol at age 74. He drinks once x 3 weeks and splits 1/2 gallon with his friends. He last drank 3 weeks ago. Longest period of sobriety was 2 years. Current stressor is his divorce (due to his substance dependence) and being apart from his 3 kids (10,7,2). Past withdrawal symptoms included sweats, headache and dry mouth. He currently reports sweats. He has lost 20 lbs in past 3 weeks and reports poor appetite. Pt denies SI, HI and AVH. No delusions noted.  He has two prior suicide attempts. Pt [facility] and [facility] since 2008 for detox/substance abuse and suicide attempts.   Axis I: Polysubstance Dependence Axis II: Deferred Axis III:  Past Medical History  Diagnosis Date  . Glioblastoma   . Seizures   . Post traumatic stress disorder    Axis IV: economic problems, occupational problems, problems related to social environment and problems with primary support group Axis V: 41-50 serious symptoms  Past Medical History:  Past Medical History  Diagnosis Date  . Glioblastoma   . Seizures   . Post traumatic stress disorder     Past Surgical History  Procedure Date  . Appendectomy     Family History: No family  history on file.  Social History:  reports that he has been smoking Cigarettes.  He has been smoking about 1 pack per day. He does not have any smokeless tobacco history on file. He reports that he drinks alcohol. He reports that he uses illicit drugs (IV and Marijuana).  Additional Social History:  Alcohol / Drug Use Pain Medications: abuses morphine & dilaudid Prescriptions: abuses xanax Over the Counter: n/a History of alcohol / drug use?: Yes Longest period of sobriety (when/how long): 3 yrs Negative Consequences of Use: Personal relationships;Financial Withdrawal Symptoms: Sweats;Other (Comment) (headache, dry mouth) Substance #1 Name of Substance 1: morphine 1 - Age of First Use: 24 1 - Amount (size/oz): varies 1 - Frequency: daily  1 - Duration: 2 years 1 - Last Use / Amount: 01/21/12 - 4mg  Substance #2 Name of Substance 2: dilaudid 2 - Age of First Use: 25 2 - Amount (size/oz): varies 2 - Frequency: every other day 2 - Duration: 1 year 2 - Last Use / Amount: 01/21/12 - 20  mg Substance #3 Name of Substance 3: alcohol 3 - Age of First Use: 12 3 - Amount (size/oz): splits 1/2 gallon with friends 3 - Frequency: once a month 3 - Duration: several years 3 - Last Use / Amount: 3 weeks ago - split a half gallon w/ friends Substance #4 Name of Substance 4: xanax 36 - Age of First Use: 59  4 - Amount (size/oz): 6 to 7 xanax 1 mg 4 - Frequency: daily  4 - Duration: for 1.5 yrs 4 - Last Use / Amount: 01/19/12 - 30 mg  CIWA: CIWA-Ar BP: 149/78 mmHg Pulse Rate: 85  COWS:    Allergies:  Allergies  Allergen Reactions  . Benadryl (Diphenhydramine Hcl) Anaphylaxis  . Penicillins Anaphylaxis    Home Medications:  (Not in a hospital admission)  OB/GYN Status:  No LMP for male patient.  General Assessment Data [facility] Living Arrangements: Alone Can pt return to current living arrangement?: Yes Admission Status: Voluntary Is patient capable of  signing voluntary admission?: Yes Transfer from: Acute Hospital Referral Source: Self/Family/Friend  Education Status Is patient currently in school?: No Current Grade: n/a Highest grade of school patient has completed: 12 Name of school: Grad at [employer] in [employer] person: n/a  Risk to self Suicidal Ideation: No Suicidal Intent: No Is patient at risk for suicide?: No Suicidal Plan?: No Access to Means: No What has been your use of drugs/alcohol within the last 12 months?: daily use morphine, every other day dilaudid, xanax daily Previous Attempts/Gestures: Yes How many times?: 2  Other Self Harm Risks: n/a Triggers for Past Attempts: Other (Comment) (mother's death) Intentional Self Injurious Behavior: None Family Suicide History: No Recent stressful life event(s): Divorce;Financial Problems Persecutory voices/beliefs?: No Depression: Yes Depression Symptoms: Fatigue;Loss of interest in usual pleasures Substance abuse history and/or treatment for substance abuse?: Yes Suicide prevention information given to non-admitted patients: Not applicable  Risk to Others Homicidal Ideation: No Thoughts of Harm to Others: No Current Homicidal Intent: No Current Homicidal Plan: No Access to Homicidal Means: No Identified Victim: n/a History of harm to others?: No Assessment of Violence: None Noted Violent Behavior Description: n/a Does patient have access to weapons?: No Criminal Charges Pending?: No Does patient have a court date: No  Psychosis Hallucinations: None noted Delusions: None noted  Mental Status Report Appear/Hygiene: Other (Comment) (poor dental hygiene) Eye Contact: Fair Motor Activity: Freedom of movement Speech: Logical/coherent Level of Consciousness: Alert Mood: Other (Comment) (euthymic) Affect: Appropriate to circumstance Anxiety Level: Minimal Thought Processes: Relevant;Coherent Judgement: Impaired Orientation:  Person;Time;Situation;Place Obsessive Compulsive Thoughts/Behaviors: None  Cognitive Functioning Concentration: Normal Memory: Recent Intact;Remote Intact IQ: Average Insight: Fair Impulse Control: Poor Appetite: Poor Weight Loss: 20  (in 3 weeks) Weight Gain: 0  Sleep: No Change Total Hours of Sleep: 4  Vegetative Symptoms: None  ADLScreening [facility] Assessment Services) Patient's cognitive ability adequate to safely complete daily activities?: Yes Patient able to express need for assistance with ADLs?: Yes Independently performs ADLs?: Yes  Abuse/Neglect [facility]) Physical Abuse: Denies Verbal Abuse: Denies Sexual Abuse: Denies  Prior Inpatient Therapy Prior Inpatient Therapy: Yes Prior Therapy Dates: 2012,2011,2010,2009,2008 [facility]/Provider([facility] Reason for Treatment: detox/SA treatment/suicide attempts  Prior Outpatient Therapy Prior Outpatient Therapy: Yes Prior Therapy Dates: unknown Prior Therapy Facilty/Provider(s): [facility] Reason for Treatment: med management  ADL Screening (condition at time of admission) Patient's cognitive ability adequate to safely complete daily activities?: Yes Patient able to express need for assistance with ADLs?: Yes Independently performs ADLs?: Yes Weakness of Legs: None Weakness of Arms/Hands: None  Home Assistive Devices/Equipment Home Assistive Devices/Equipment: None    Abuse/Neglect Assessment (Assessment to be complete while patient is alone) Physical Abuse: Denies Verbal Abuse: Denies Sexual Abuse: Denies Exploitation of patient/patient's resources: Denies Self-Neglect: Denies Values / Beliefs Cultural Requests During Hospitalization: None Spiritual Requests During Hospitalization: None  Advance Directives (For Healthcare) Advance Directive: Patient does not have advance directive;Patient would not like information    Additional Information 1:1 In Past 12  Months?: No CIRT Risk: No Elopement Risk: No Does patient have medical clearance?: Yes     Disposition:  Disposition Disposition of Patient: Inpatient treatment program Type of inpatient treatment program: Adult (detox)  On Site Evaluation by:   Reviewed with Physician:     Donnamarie Rossetti P 01/22/2012 12:54 AM

## 2012-01-22 NOTE — Progress Notes (Signed)
Patient ID: Alex Sandoval, male   DOB: 1985-11-04, 26 y.o.   MRN: [id number] Pt admitted on voluntary basis, pt reports to having been using opiates and benzo's, pt reports last opiate use being yesterday and last benzo use 3 days ago. Pt denies any depression and denies any suicidal thoughts and is able to contract for safety on the unit. Pt is here from [place], drove himself here for detox, pt has a place to stay when he leaves here, pt oriented to unit and safety maintained

## 2012-01-22 NOTE — [facility] Suicide Risk Assessment (Signed)
Suicide Risk Assessment  Admission Assessment     Demographic factors:  Assessment Details Time of Assessment: Admission Information Obtained From: Patient Current Mental Status:    Loss Factors:    Historical Factors:  Historical Factors: Prior suicide attempts;Family history of mental illness or substance abuse Risk Reduction Factors:  Risk Reduction Factors: Sense of responsibility to family;Responsible for children under 27 years of age;Positive social support  CLINICAL FACTORS:   Alcohol/Substance Abuse/Dependencies Unstable or Poor Therapeutic Relationship Previous Psychiatric Diagnoses and Treatments  COGNITIVE FEATURES THAT CONTRIBUTE TO RISK:  Polarized thinking    SUICIDE RISK:   Minimal: No identifiable suicidal ideation.  Patients presenting with no risk factors but with morbid ruminations; may be classified as minimal risk based on the severity of the depressive symptoms  PLAN OF CARE: Admitted to [facility] emergently, voluntarily for crisis stabilization, detox treatment of opiates and benzodiazepines. Patient reported he has been suffering with the drug of abuse since he was 5 or 26 years old and [facility] and also rehabilitation services at the [facility]. Patient works in [employer] and has lost lot of money and the house and relationship with his ex wife. He has a 3 children who has been staying with their mother. Patient reportedly tired of abusing IV drug of morphine, Dilaudid and snorting benzodiazepines and seeking treatment.   Wyat Infinger,JANARDHAHA R. 01/22/2012, 11:18 AM

## 2012-01-22 NOTE — ED Notes (Signed)
Per ACT, pt has been accepted to [facility] by Dr. Henrene Hawking to Dr. Koren Shiver   Room 304 Bed 1  Writer will notify MD and RN of disposition.   Janann Colonel., MSW, [facility] Clinical Social Worker [phone]

## 2012-01-22 NOTE — H&P (Signed)
Psychiatric Admission Assessment Adult  Patient Identification:  Alex Sandoval Date of Evaluation:  01/22/2012 26yo recently divorced WM CC: Needs opiate detox  UDS+opiates  No ETOH measured  History of Present Illness: Last detox was 2 years ago at [facility] stayed clean and sober until about 6 mos ago when he was served with divorce papers.He was starting to have blackouts from his polysubstance abuse and came in for help. Last alcohol was 3 weeks ago last used Xanax 6/22 and Dilaudid 6/22and Morphine.    Past Psychiatric History: Prior admissions to [facility] and [facility] . Says most recent detox was [facility] 2 years ago.    Substance Abuse History: Please see [facility] assessment   Social History:    reports that he has been smoking Cigarettes.  He has been smoking about 1 pack per day. He does not have any smokeless tobacco history on file. He reports that he drinks alcohol. He reports that he uses illicit drugs (IV and Marijuana). Married and now divorced once has 3 children from this union 2 daughters 30 & 2 one son 48.  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  Family Psych History: Denies  Past Medical History:     Past Medical History  Diagnosis Date  . Glioblastoma   . Seizures   . Post traumatic stress disorder     Had seizures from Glioblastoma denies antiepileptics    Past Surgical History  Procedure Date  . Appendectomy     Allergies:  Allergies  Allergen Reactions  . Benadryl (Diphenhydramine Hcl) Anaphylaxis  . Penicillins Anaphylaxis    Current Medications:  Prior to Admission medications   Medication Sig Start Date End Date Taking? Authorizing Provider  HYDROmorphone (DILAUDID) 8 MG tablet Take 8 mg by mouth every 4 (four) hours as needed. Pain    Historical Provider, MD  morphine (MSIR) 15 MG tablet Take 15 mg by mouth every 4 (four) hours as needed. Pain    Historical Provider, MD    Mental Status  Examination/Evaluation: Objective:  Appearance: Casual  Psychomotor Activity:  Normal no tremor or sweats   Eye Contact::  Good  Speech:  Normal Rate  Volume:  Normal  Mood:  Sad about divorce   Affect:  Congruent  Thought Process:clear rational goal oriented -get cleaned up sell the house move back to [place] to be near his children     Orientation:  Full  Thought Content:  Denies AVH/psychosis   Suicidal Thoughts:  No  Homicidal Thoughts:  No  Judgement:  Fair  Insight:  Good    DIAGNOSIS:    AXIS I Substance Abuse  AXIS II Deferred  AXIS III See medical history.  AXIS IV economic problems, occupational problems, other psychosocial or environmental problems, problems related to social environment and problems with primary support group  AXIS V 41-50 serious symptoms     Treatment Plan Summary: Admit for medically assisted detox from opiates using the clonidine and Librium protocols. Agree with H&P from ED

## 2012-01-22 NOTE — Progress Notes (Signed)
Patient ID: Alex Sandoval, male   DOB: 1985-09-21, 26 y.o.   MRN: [id number] Pt withdrawn with dull, flat affect on approach. Pt seclusive to room during shift. Pt comliant with medications and denies SI or plans to harm himself at this time. RN encouraged interaction with staff/peers. No complaints noted.

## 2012-01-22 NOTE — ED Notes (Signed)
Pt Belongings: Large black duffel bag with 1 white bag of clothes sent with pt to [facility].

## 2012-01-22 NOTE — ED Provider Notes (Signed)
Medical screening examination/treatment/procedure(s) were performed by non-physician practitioner and as supervising physician I was immediately available for consultation/collaboration.   Dayton Bailiff, MD 01/22/12 [phone]

## 2012-01-23 DIAGNOSIS — F1310 Sedative, hypnotic or anxiolytic abuse, uncomplicated: Secondary | ICD-10-CM | POA: Diagnosis present

## 2012-01-23 DIAGNOSIS — F1120 Opioid dependence, uncomplicated: Secondary | ICD-10-CM

## 2012-01-23 DIAGNOSIS — F1110 Opioid abuse, uncomplicated: Secondary | ICD-10-CM | POA: Diagnosis present

## 2012-01-23 MED ORDER — NICOTINE 21 MG/24HR TD PT24
21.0000 mg | MEDICATED_PATCH | Freq: Every day | TRANSDERMAL | Status: DC
  Administered 2012-01-23 – 2012-01-24 (×2): 21 mg via TRANSDERMAL
  Filled 2012-01-23 (×3): qty 1

## 2012-01-23 NOTE — Progress Notes (Signed)
[facility] Group Notes:  (Counselor/Nursing/MHT/Case Management/Adjunct)  01/23/2012 4:58 PM  Type of Therapy:  Group Therapy at 11AM  Participation Level:  Active  Participation Quality:  Attentive and Sharing  Affect:  Angry  Cognitive:  Alert and Oriented  Insight:  Limited  Engagement in Group:  Good  Engagement in Therapy:  Limited as patient left group early  Modes of Intervention:  Clarification and Support  Summary of Progress/Problems:  Alex Sandoval was attentive during first portion of group session which focused on what patient's see as their own obstacles to recovery.  Patient shared that he was frustrated with the dynamics of group as people continue to joke about serious issues. Group process quickly deteriorated and Alex Sandoval was seen in hall yelling with anger. Other staff calmed patient down as group continued to proceed.    Clide Dales 01/23/2012, 4:58 PM  [facility] Group Notes:  (Counselor/Nursing/MHT/Case Management/Adjunct)  01/23/2012 5:05 PM  Type of Therapy:  Group Therapy at 1:15  Participation Level:  Active  Participation Quality:  Appropriate  Affect:  Appropriate  Cognitive:  Alert and Oriented  Engagement in Group:  Good  Modes of Intervention:  Education, Socialization and Support  Summary of Progress/Problems: Patient attended group presentation by Interior and spatial designer of  [employer] [place] ([employer]). Alex Sandoval was attentive and appropriate during session.     Clide Dales 01/23/2012, 5:05 PM

## 2012-01-23 NOTE — Progress Notes (Signed)
[facility] MD Progress Note  01/23/2012 5:11 PM  S: "I still struggle with my temper. I don't have any legal issues because of my temper. But if I don't get it under control, I don't know where it might lead me. I feel irritated coming off of multiple drugs. The withdrawal symptoms are getting worse. Can I have ativan or xanax pills. Those help me with this kind of feelings. The headaches are getting worse also. I have been using and abusing these drugs x 6 months. Drugs take my mind of bad stuff that I am dealing with such as the death of my mother 2 years ago from cancer. Also my military experience hunts me from time to time. When I am done with initial detox here, I will go to [facility] for further substance treatment".  Diagnosis:   Axis I: Opiod abuse and dependency Axis II: Deferred Axis III:  Past Medical History  Diagnosis Date  . Glioblastoma   . Seizures   . Post traumatic stress disorder    Axis IV: other psychosocial or environmental problems Axis V: 51-60 moderate symptoms  ADL's:  Intact  Sleep: Good  Appetite:  Good  Suicidal Ideation:  Plan:  No Intent:  No Means:  No Homicidal Ideation:  Plan:  No Intent:  no Means:  No  AEB (as evidenced by): per patient's reports  Mental Status Examination/Evaluation: Objective:  Appearance: Casual  Eye Contact::  Good  Speech:  Clear and Coherent  Volume:  Normal  Mood:  Depressed  Affect:  Flat  Thought Process:  Coherent  Orientation:  Full  Thought Content:  Rumination  Suicidal Thoughts:  No  Homicidal Thoughts:  No  Memory:  Immediate;   Good Recent;   Good Remote;   Good  Judgement:  Fair  Insight:  Fair  Psychomotor Activity:  Restlessness and anxiousness  Concentration:  Fair  Recall:  Good  Akathisia:  No  Handed:  Right  AIMS (if indicated):     Assets:  Desire for Improvement  Sleep:  Number of Hours: 6    Vital Signs:Blood pressure 118/70, pulse 72, temperature 96.8 F (36 C), temperature  source Oral, resp. rate 18, height 5\' 9"  (1.753 m). Current Medications: Current Facility-Administered Medications  Medication Dose Route Frequency Provider Last Rate Last Dose  . acetaminophen (TYLENOL) tablet 650 mg  650 mg Oral Q6H PRN Nehemiah Settle, MD      . alum & mag hydroxide-simeth (MAALOX/MYLANTA) 200-200-20 MG/5ML suspension 30 mL  30 mL Oral Q4H PRN Nehemiah Settle, MD      . chlordiazePOXIDE (LIBRIUM) capsule 25 mg  25 mg Oral Q6H PRN Mickie D. Adams, PA      . chlordiazePOXIDE (LIBRIUM) capsule 25 mg  25 mg Oral QID Mickie D. Adams, PA   25 mg at 01/22/12 2143   Followed by  . chlordiazePOXIDE (LIBRIUM) capsule 25 mg  25 mg Oral TID Mickie D. Adams, PA   25 mg at 01/23/12 1155   Followed by  . chlordiazePOXIDE (LIBRIUM) capsule 25 mg  25 mg Oral BH-qamhs Mickie D. Adams, PA       Followed by  . chlordiazePOXIDE (LIBRIUM) capsule 25 mg  25 mg Oral Daily Mickie D. Adams, PA      . cloNIDine (CATAPRES) tablet 0.1 mg  0.1 mg Oral QID Mickie D. Adams, PA   0.1 mg at 01/23/12 1154   Followed by  . cloNIDine (CATAPRES) tablet 0.1 mg  0.1 mg  Oral BH-qamhs Mickie D. Adams, PA   0.1 mg at 01/22/12 2144   Followed by  . cloNIDine (CATAPRES) tablet 0.1 mg  0.1 mg Oral QAC breakfast Mickie D. Adams, PA      . dicyclomine (BENTYL) tablet 20 mg  20 mg Oral Q6H PRN Nehemiah Settle, MD      . hydrOXYzine (ATARAX/VISTARIL) tablet 25 mg  25 mg Oral Q6H PRN Nehemiah Settle, MD   25 mg at 01/23/12 0930  . loperamide (IMODIUM) capsule 2-4 mg  2-4 mg Oral PRN Nehemiah Settle, MD      . magnesium hydroxide (MILK OF MAGNESIA) suspension 30 mL  30 mL Oral Daily PRN Nehemiah Settle, MD      . methocarbamol (ROBAXIN) tablet 500 mg  500 mg Oral Q8H PRN Nehemiah Settle, MD   500 mg at 01/22/12 2144  . multivitamin with minerals tablet 1 tablet  1 tablet Oral Daily Mickie D. Adams, PA   1 tablet at 01/23/12 0813  . naproxen (NAPROSYN)  tablet 500 mg  500 mg Oral BID PRN Nehemiah Settle, MD      . nicotine (NICODERM CQ - dosed in mg/24 hours) patch 21 mg  21 mg Transdermal Q0600 Alyson Kuroski-Mazzei, DO   21 mg at 01/23/12 1000  . ondansetron (ZOFRAN-ODT) disintegrating tablet 4 mg  4 mg Oral Q6H PRN Nehemiah Settle, MD      . thiamine (B-1) injection 100 mg  100 mg Intramuscular Once Mickie D. Adams, PA      . thiamine (VITAMIN B-1) tablet 100 mg  100 mg Oral Daily Mickie D. Adams, PA   100 mg at 01/23/12 0813    Lab Results:  Results for orders placed during the hospital encounter of 01/21/12 (from the past 48 hour(s))  CBC     Status: Normal   Collection Time   01/21/12 10:44 PM      Component Value Range Comment   WBC 5.5  4.0 - 10.5 K/uL    RBC 4.49  4.22 - 5.81 MIL/uL    Hemoglobin 13.0  13.0 - 17.0 g/dL    HCT 08.6  57.8 - 46.9 %    MCV 88.4  78.0 - 100.0 fL    MCH 29.0  26.0 - 34.0 pg    MCHC 32.7  30.0 - 36.0 g/dL    RDW 62.9  52.8 - 41.3 %    Platelets 217  150 - 400 K/uL   COMPREHENSIVE METABOLIC PANEL     Status: Abnormal   Collection Time   01/21/12 10:44 PM      Component Value Range Comment   Sodium 137  135 - 145 mEq/L    Potassium 4.6  3.5 - 5.1 mEq/L    Chloride 103  96 - 112 mEq/L    CO2 27  19 - 32 mEq/L    Glucose, Bld 106 (*) 70 - 99 mg/dL    BUN 17  6 - 23 mg/dL    Creatinine, Ser 2.44  0.50 - 1.35 mg/dL    Calcium 9.4  8.4 - 01.0 mg/dL    Total Protein 7.7  6.0 - 8.3 g/dL    Albumin 4.0  3.5 - 5.2 g/dL    AST 16  0 - 37 U/L    ALT 17  0 - 53 U/L    Alkaline Phosphatase 84  39 - 117 U/L    Total Bilirubin 0.3  0.3 - 1.2 mg/dL  GFR calc non Af Amer >90  >90 mL/min    GFR calc Af Amer >90  >90 mL/min   ETHANOL     Status: Normal   Collection Time   01/21/12 10:44 PM      Component Value Range Comment   Alcohol, Ethyl (B) <11  0 - 11 mg/dL   URINE RAPID DRUG SCREEN (HOSP PERFORMED)     Status: Abnormal   Collection Time   01/21/12 11:01 PM      Component Value  Range Comment   Opiates POSITIVE (*) NONE DETECTED    Cocaine NONE DETECTED  NONE DETECTED    Benzodiazepines NONE DETECTED  NONE DETECTED    Amphetamines NONE DETECTED  NONE DETECTED    Tetrahydrocannabinol NONE DETECTED  NONE DETECTED    Barbiturates NONE DETECTED  NONE DETECTED     Physical Findings: AIMS:  , ,  ,  ,    CIWA:  CIWA-Ar Total: 5  COWS:     Treatment Plan Summary: Daily contact with patient to assess and evaluate symptoms and progress in treatment Medication management  Plan: Declined to order any form of benzodiazepines, rather:  Initiate Neurontin 300 mg tid for anxiety. Continue current treatment plan.   Armandina Stammer I 01/23/2012, 5:11 PM

## 2012-01-23 NOTE — Progress Notes (Signed)
Patient ID: ARBIE BLANKLEY, male   DOB: 07-11-1986, 26 y.o.   MRN: [id number] D: Pt. In bed all evening, did not attend group, got up saying "I feel bad." Pt. Requesting meds. A: Writer encouraged client to get VS taken after which meds were administered (see MAR). R: Writer will monitor for s/s of detox. Staff will monitor for safety q43min.

## 2012-01-23 NOTE — Progress Notes (Signed)
Pt has been up for groups and has been interacting with select peers and staff.  He rated both his depression and hopelessness a 1 on his self-inventory.  He rated his anxiety and agitation a 10 today.  He has had two "outburts" with staff and one of his peers this morning.  He has since apologized to both the staff member and his peer.  Pt does admit to having "major anxiety and agitation at times".  He stated, "the only thing that has helped me before was ativan or xanax.  The NP told pt that he would not be getting any of those meds but pt does have librium, clonidine and vistaril. He has taken prn vistaril at 0930 and robaxin at 1725 to help with his symptoms.  His CIWA has been between 5 and 4 this afternoon.  He denied any S/H ideation or A/V hallucinations.  He plans to go to "[facility]" when he is ready for discharge.

## 2012-01-23 NOTE — Progress Notes (Signed)
Medical/psychiatric screening examination/treatment/procedure(s) were performed by non-physician practitioner and as supervising physician I was immediately available for consultation/collaboration.   

## 2012-01-24 MED ORDER — AMITRIPTYLINE HCL 25 MG PO TABS: 25.0000 mg | ORAL_TABLET | Freq: Every day | ORAL | Status: DC

## 2012-01-24 MED ORDER — CLONIDINE HCL 0.1 MG PO TABS
0.1000 mg | ORAL_TABLET | ORAL | Status: DC
  Filled 2012-01-24: qty 10

## 2012-01-24 MED ORDER — METHOCARBAMOL 750 MG PO TABS: 750.0000 mg | ORAL_TABLET | Freq: Three times a day (TID) | ORAL | Status: DC | PRN

## 2012-01-24 MED ORDER — GABAPENTIN 600 MG PO TABS
300.0000 mg | ORAL_TABLET | Freq: Four times a day (QID) | ORAL | Status: DC
  Filled 2012-01-24 (×4): qty 0.5

## 2012-01-24 MED ORDER — METHOCARBAMOL 750 MG PO TABS
750.0000 mg | ORAL_TABLET | Freq: Three times a day (TID) | ORAL | Status: DC | PRN
  Filled 2012-01-24 (×2): qty 21

## 2012-01-24 MED ORDER — CLONIDINE HCL 0.1 MG PO TABS: ORAL_TABLET | ORAL | Status: DC

## 2012-01-24 MED ORDER — AMITRIPTYLINE HCL 25 MG PO TABS
25.0000 mg | ORAL_TABLET | Freq: Every day | ORAL | Status: DC
  Filled 2012-01-24: qty 1

## 2012-01-24 MED ORDER — NICOTINE 21 MG/24HR TD PT24: 1.0000 | MEDICATED_PATCH | Freq: Every day | TRANSDERMAL | Status: DC

## 2012-01-24 MED ORDER — GABAPENTIN 600 MG PO TABS: 300.0000 mg | ORAL_TABLET | Freq: Four times a day (QID) | ORAL | Status: DC

## 2012-01-24 NOTE — Progress Notes (Signed)
[facility] Adult Inpatient Family/Significant Other Suicide Prevention Education  Suicide Prevention Education:  Patient  Reports he will be discharging to [facility] for substance abuse. Although patient reports no current suicidal ideation he does have history of two prior suicide attempts noted on initial PSA from [facility]. The patient Alex Sandoval has refused to provide written consent for family/significant other to be provided Family/Significant Other Suicide Prevention Education during admission and/or prior to discharge. Writer provided suicide prevention education directly to patient; conversation included risk factors, warning signs and resources to contact for help. Mobile crisis services explained and contact card placed in chart for pt to receive at discharge.     Clide Dales 01/24/2012, 9:47 AM

## 2012-01-24 NOTE — Discharge Summary (Signed)
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 Glioblastoma   . Seizures   . Post traumatic stress disorder    AXIS IV:  other psychosocial or environmental problems AXIS V:  41-50 serious symptoms  Level of Care:  RTC  Hospital Course:   Last detox was 2 years ago at [facility] stayed clean and sober until about 6 mos ago when he was served with divorce papers.He was starting to have blackouts from his polysubstance abuse and came in for help.  Last alcohol was 3 weeks ago last used Xanax 6/22 and Dilaudid 6/22and Morphine.   While a patient in this hospital, Alex Sandoval received medication management for his opiate dependence and benzo dependence. They were ordered and received Clonidine and Neurontin for detox.  He also received prescriptions for Elavil and Robaxin for his back pain.  Further her received prescription for Nicoderm patches to help him stop smoking. They were also enrolled in group counseling sessions and activities in which they participated actively.   Patient attended treatment team meeting this am and met with treatment team members. Pt symptoms, treatment plan and response to treatment discussed. Alex Sandoval endorsed that their symptoms have somewhat improved. Pt also stated that they are stable for discharge. In other to maintain his sobriety, they will continue psychiatric care on outpatient basis. They will follow-up at [facility].  Upon discharge, patient adamantly denies  suicidal, homicidal ideations, auditory, visual hallucinations and or delusional thinking. They left [facility] with all personal belongings in no apparent distress.  Consults:  None  Significant Diagnostic Studies:  labs: UDS positive for opiates  Discharge Vitals:   Blood pressure 115/81, pulse 84, temperature 97.4 F (36.3 C), temperature source Oral, resp. rate 16, height 5\' 9"  (1.753 m).  Mental Status Exam: See Mental Status Examination and Suicide Risk Assessment completed by Attending Physician prior to discharge.  Discharge destination:  [facility]  Is patient on multiple antipsychotic therapies at discharge:  No   Has Patient had three or more failed trials of antipsychotic monotherapy by history:  N/A Recommended Plan for Multiple Antipsychotic Therapies: N/A  Medication List  As of 01/24/2012 11:57 PM   STOP taking these medications         HYDROmorphone 8 MG tablet      morphine 15 MG tablet         TAKE these medications      Indication    amitriptyline 25 MG tablet   Commonly known as: ELAVIL   Take 1 tablet (25 mg total) by mouth at bedtime. For pain management, depression, and insomnia.       cloNIDine 0.1 MG tablet   Commonly known as: CATAPRES   For opiate withdrawal, take two more on 25th, 4 on the 26th, 2 on 27th, and 28th then stop. Divided doses       gabapentin 600 MG tablet   Commonly known as: NEURONTIN   Take 0.5 tablets (300 mg total) by mouth 4 (four) times daily.  For anxiety       methocarbamol 750 MG tablet   Commonly known as: ROBAXIN   Take 1 tablet (750 mg total) by mouth every 8 (eight) hours as needed (for spasms).       nicotine 21 mg/24hr patch   Commonly known as: NICODERM CQ - dosed in mg/24 hours   Place 1 patch onto the skin daily at 6 (six) AM. For smoking cessation.            Follow-up Information    Follow up with Alex Sandoval is working on his own follow up plan, and declines help from his International aid/development worker.         Follow-up  recommendations:   Activities: Resume typical activities Diet: Resume typical diet Other: Follow up with outpatient provider after he leaves [facility] and report any side effects to out patient prescriber.  COMMENT: Despite the patient possibly having a very serious brain tumor (pt declined to allow [place] to verify any facts associated with his possible tumor), he conducted himself in a manner that was not real compatible with a milieu that will be successful for other patients.  His needs seemed to supercede those of everyone around him.  His impulse control was minimal and his self importance was remarkable.  Disportionate staff time was spent in meeting his needs first and foremost.  His future substance abuse treatment needs might be better served in a more restrictive setting, perhaps legal setting.  SignedDan Humphreys, [place] Woon 01/24/2012, 11:57 PM

## 2012-01-24 NOTE — [facility] Suicide Risk Assessment (Signed)
Suicide Risk Assessment  Discharge Assessment     Demographic factors:  Male;Adolescent or young adult   Historical Factors: Prior suicide attempts;Family history of mental illness or substance abuse  Continued Clinical Symptoms:  Severe Anxiety and/or Agitation Dysthymia Alcohol/Substance Abuse/Dependencies Personality Disorders:   Comorbid alcohol abuse/dependence Chronic Pain Previous Psychiatric Diagnoses and Treatments  Discharge Diagnoses:   AXIS I:  Substance Induced Mood Disorder and Benzodiazepine and Opiate Dependence continuous AXIS II:  Cluster B Traits AXIS III:   Past Medical History  Diagnosis Date  . Glioblastoma   . Seizures   . Post traumatic stress disorder    AXIS IV:  other psychosocial or environmental problems, problems related to social environment and problems with primary support group AXIS V:  41-50 serious symptoms  Cognitive Features That Contribute To Risk:  Closed-mindedness Polarized thinking Thought constriction (tunnel vision)    Suicide Risk:  Minimal: No identifiable suicidal ideation.  Patients presenting with no risk factors but with morbid ruminations; may be classified as minimal risk based on the severity of the depressive symptoms  Results for orders placed during the hospital encounter of 01/21/12 (from the past 72 hour(s))  CBC     Status: Normal   Collection Time   01/21/12 10:44 PM      Component Value Range Comment   WBC 5.5  4.0 - 10.5 K/uL    RBC 4.49  4.22 - 5.81 MIL/uL    Hemoglobin 13.0  13.0 - 17.0 g/dL    HCT 16.1  09.6 - 04.5 %    MCV 88.4  78.0 - 100.0 fL    MCH 29.0  26.0 - 34.0 pg    MCHC 32.7  30.0 - 36.0 g/dL    RDW 40.9  81.1 - 91.4 %    Platelets 217  150 - 400 K/uL   COMPREHENSIVE METABOLIC PANEL     Status: Abnormal   Collection Time   01/21/12 10:44 PM      Component Value Range Comment   Sodium 137  135 - 145 mEq/L    Potassium 4.6  3.5 - 5.1 mEq/L    Chloride 103  96 - 112 mEq/L    CO2 27  19  - 32 mEq/L    Glucose, Bld 106 (*) 70 - 99 mg/dL    BUN 17  6 - 23 mg/dL    Creatinine, Ser 7.82  0.50 - 1.35 mg/dL    Calcium 9.4  8.4 - 95.6 mg/dL    Total Protein 7.7  6.0 - 8.3 g/dL    Albumin 4.0  3.5 - 5.2 g/dL    AST 16  0 - 37 U/L    ALT 17  0 - 53 U/L    Alkaline Phosphatase 84  39 - 117 U/L    Total Bilirubin 0.3  0.3 - 1.2 mg/dL    GFR calc non Af Amer >90  >90 mL/min    GFR calc Af Amer >90  >90 mL/min   ETHANOL     Status: Normal   Collection Time   01/21/12 10:44 PM      Component Value Range Comment   Alcohol, Ethyl (B) <11  0 - 11 mg/dL   URINE RAPID DRUG SCREEN (HOSP PERFORMED)     Status: Abnormal   Collection Time   01/21/12 11:01 PM      Component Value Range Comment   Opiates POSITIVE (*) NONE DETECTED    Cocaine NONE DETECTED  NONE DETECTED  Benzodiazepines NONE DETECTED  NONE DETECTED    Amphetamines NONE DETECTED  NONE DETECTED    Tetrahydrocannabinol NONE DETECTED  NONE DETECTED    Barbiturates NONE DETECTED  NONE DETECTED     RISK REDUCTION FACTORS: What pt has learned from hospital stay is "to stay the [facility] away from here" and that it is better to talk than to punch things.  Risk of self harm is elevated by his depression, his anxiety, his chronic illness, and his addiction, but he realizes that he has himself, his kid, and his family to live for.  Risk of harm to others is elevated by his history of aggression.  He struggles with this considerably.  Pt seen in treatment team where he divulged the above information. The treatment team concluded that he was ready for discharge and had met his goals for an inpatient setting.  PLAN: Discharge home Continue Medication List  As of 01/24/2012  1:06 PM   STOP taking these medications         HYDROmorphone 8 MG tablet      morphine 15 MG tablet         TAKE these medications      Indication    amitriptyline 25 MG tablet   Commonly known as: ELAVIL   Take 1 tablet (25 mg total) by mouth at  bedtime. For pain management, depression, and insomnia.       cloNIDine 0.1 MG tablet   Commonly known as: CATAPRES   For opiate withdrawal, take two more on 25th, 4 on the 26th, 2 on 27th, and 28th then stop. Divided doses       gabapentin 600 MG tablet   Commonly known as: NEURONTIN   Take 0.5 tablets (300 mg total) by mouth 4 (four) times daily. For anxiety       methocarbamol 750 MG tablet   Commonly known as: ROBAXIN   Take 1 tablet (750 mg total) by mouth every 8 (eight) hours as needed (for spasms).       nicotine 21 mg/24hr patch   Commonly known as: NICODERM CQ - dosed in mg/24 hours   Place 1 patch onto the skin daily at 6 (six) AM. For smoking cessation.            Follow-up recommendations:  Activities: Resume typical activities Diet: Resume typical diet Other: Follow up with outpatient provider and report any side effects to out patient prescriber.   Latessa Tillis 01/24/2012, 1:05 PM

## 2012-01-24 NOTE — H&P (Signed)
Medical/psychiatric screening examination/treatment/procedure(s) were performed by non-physician practitioner and as supervising physician I was immediately available for consultation/collaboration.  I have seen and examined this patient and agree the major elements of this evaluation.  
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
## 2012-01-24 NOTE — Progress Notes (Signed)
Pt was discharged home today.  He denied any S/I H/I or A/V hallucinations.    He made his own appointment with [facility] and arranged for pick-up.  He was given rx, sample medications, and hotline info booklet.  He voiced understanding to all instructions provided.  He declined the need for smoking cessation materials.  He removed his nicotine patch before he left.
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
## 2012-01-24 NOTE — Progress Notes (Signed)
[facility] MD Progress Note  01/24/2012 12:49 PM  S: "I want to go to [facility].  They have a bed for me in 10 minutes".  Diagnosis:   Axis I: Opiod abuse and dependency Axis II: Deferred Axis III:  Past Medical History  Diagnosis Date  . Glioblastoma   . Seizures   . Post traumatic stress disorder    Axis IV: other psychosocial or environmental problems Axis V: 51-60 moderate symptoms  ADL's:  Intact  Sleep: Good  Appetite:  Good  Suicidal Ideation:  Plan:  No Intent:  No Means:  No Homicidal Ideation:  Plan:  No Intent:  no Means:  No  Mental Status Examination/Evaluation: Objective:  Appearance: Casual  Eye Contact::  Good  Speech:  Clear and Coherent  Volume:  Normal  Mood:  Depressed  Affect:  Flat  Thought Process:  Coherent  Orientation:  Full  Thought Content:  Rumination  Suicidal Thoughts:  No  Homicidal Thoughts:  No  Memory:  Immediate;   Good Recent;   Good Remote;   Good  Judgement:  Fair  Insight:  Fair  Psychomotor Activity:  Restlessness and anxiousness  Concentration:  Fair  Recall:  Good  Akathisia:  No  Handed:  Right  AIMS (if indicated):     Assets:  Desire for Improvement  Sleep:  Number of Hours: 6    Vital Signs:Blood pressure 115/81, pulse 84, temperature 97.4 F (36.3 C), temperature source Oral, resp. rate 16, height 5\' 9"  (1.753 m). Current Medications: Current Facility-Administered Medications  Medication Dose Route Frequency Provider Last Rate Last Dose  . acetaminophen (TYLENOL) tablet 650 mg  650 mg Oral Q6H PRN Nehemiah Settle, MD      . alum & mag hydroxide-simeth (MAALOX/MYLANTA) 200-200-20 MG/5ML suspension 30 mL  30 mL Oral Q4H PRN Nehemiah Settle, MD      . chlordiazePOXIDE (LIBRIUM) capsule 25 mg  25 mg Oral Q6H PRN Mickie D. Adams, PA   25 mg at 01/24/12 1610  . chlordiazePOXIDE (LIBRIUM) capsule 25 mg  25 mg Oral TID Mickie D. Adams, PA   25 mg at 01/23/12 1728   Followed by  . chlordiazePOXIDE  (LIBRIUM) capsule 25 mg  25 mg Oral BH-qamhs Mickie D. Adams, PA   25 mg at 01/24/12 9604   Followed by  . chlordiazePOXIDE (LIBRIUM) capsule 25 mg  25 mg Oral Daily Mickie D. Adams, PA      . cloNIDine (CATAPRES) tablet 0.1 mg  0.1 mg Oral QID Mickie D. Adams, PA   0.1 mg at 01/23/12 2210   Followed by  . cloNIDine (CATAPRES) tablet 0.1 mg  0.1 mg Oral BH-qamhs Mickie D. Adams, PA   0.1 mg at 01/24/12 0802   Followed by  . cloNIDine (CATAPRES) tablet 0.1 mg  0.1 mg Oral QAC breakfast Mickie D. Adams, PA      . dicyclomine (BENTYL) tablet 20 mg  20 mg Oral Q6H PRN Nehemiah Settle, MD      . hydrOXYzine (ATARAX/VISTARIL) tablet 25 mg  25 mg Oral Q6H PRN Nehemiah Settle, MD   25 mg at 01/24/12 0803  . loperamide (IMODIUM) capsule 2-4 mg  2-4 mg Oral PRN Nehemiah Settle, MD      . magnesium hydroxide (MILK OF MAGNESIA) suspension 30 mL  30 mL Oral Daily PRN Nehemiah Settle, MD      . methocarbamol (ROBAXIN) tablet 500 mg  500 mg Oral Q8H PRN Nehemiah Settle,  MD   500 mg at 01/24/12 0635  . multivitamin with minerals tablet 1 tablet  1 tablet Oral Daily Mickie D. Adams, PA   1 tablet at 01/24/12 0802  . naproxen (NAPROSYN) tablet 500 mg  500 mg Oral BID PRN Nehemiah Settle, MD      . nicotine (NICODERM CQ - dosed in mg/24 hours) patch 21 mg  21 mg Transdermal Q0600 Alyson Kuroski-Mazzei, DO   21 mg at 01/24/12 9604  . ondansetron (ZOFRAN-ODT) disintegrating tablet 4 mg  4 mg Oral Q6H PRN Nehemiah Settle, MD      . thiamine (B-1) injection 100 mg  100 mg Intramuscular Once Mickie D. Adams, PA      . thiamine (VITAMIN B-1) tablet 100 mg  100 mg Oral Daily Mickie D. Adams, PA   100 mg at 01/24/12 5409    Lab Results:  No results found for this or any previous visit (from the past 48 hour(s)).  Physical Findings: AIMS:  , ,  ,  ,    CIWA:  CIWA-Ar Total: 4  COWS:  COWS Total Score: 4   Treatment Plan Summary: Daily contact  with patient to assess and evaluate symptoms and progress in treatment Medication management  Plan: Declined to order any form of benzodiazepines, rather:  Initiate Neurontin 300 mg tid for anxiety. And Elavil at HS for back pain. Continue current treatment plan.   Rondrick Barreira 01/24/2012, 12:49 PM

## 2012-01-24 NOTE — Discharge Planning (Signed)
Alex Sandoval attended AM group the past 2 days.  He was very involved both days.  Yesterday, he indicated he was here for detox from opiates, and has already applied and been accepted at [facility].  They insisted he go somewhere for detox prior to coming in.  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   Today, he demanded transfer to [facility] or the [place] or "just open the door for me."  Went on to say that he is tired of being threatened by peers and staff alike, and that he is having no withdrawal symptoms and just needs to get out of here.  Was able to admit that he does have a problem filtering his thoughts before speaking, but minimizes his responsibility while externalizing blame on others.  States he knows [facility] has a bed today, and wants to know how soon he can leave.  Explained that I need to consult with the Dr to find out if he is stable for transfer today. After group Alex Sandoval escalated.  Was threatening staff and demanding to be d/ced.  [name] and I worked at [employer] him.  He then said nothing really matters because he only has 6 months to 1 year to live due to a brain tumor diagnosed by a Dr at [facility].  Refused to sign release.  Now states that he has been in touch with [facility] and they will pick him up later today at [facility].  Would not sign release for [facility] for me to talk.  "They think I am at home."  Also expressed concern about getting into [facility] "because there is THC in the protonix and I will be positive for cannabis."  I assured him there is no THC in Protonix, and also assured him that testing positive for cannabis would not keep him out of [facility]. I have not referred Alex Sandoval to [facility], not only because he has not signed a release, but also because of his eratic, explosive behavior, poor insight, and questionable motivation for sobriety.

## 2012-01-25 NOTE — Progress Notes (Signed)
Patient Discharge Instructions: Patient declines follow up.  Wandra Scot, 01/25/2012, 4:23 PM

## 2012-01-26 ENCOUNTER — Encounter (HOSPITAL_COMMUNITY): Payer: Self-pay

## 2012-01-26 ENCOUNTER — Emergency Department (HOSPITAL_COMMUNITY): Payer: Self-pay

## 2012-01-26 ENCOUNTER — Emergency Department (HOSPITAL_COMMUNITY)
Admission: EM | Admit: 2012-01-26 | Discharge: 2012-01-26 | Disposition: A | Discharge status: Home / Self Care | Payer: Self-pay | Attending: Emergency Medicine | Admitting: Emergency Medicine

## 2012-01-26 DIAGNOSIS — Z880 Allergy status to penicillin: Secondary | ICD-10-CM | POA: Insufficient documentation

## 2012-01-26 DIAGNOSIS — F4310 Post-traumatic stress disorder, unspecified: Secondary | ICD-10-CM | POA: Insufficient documentation

## 2012-01-26 DIAGNOSIS — Z888 Allergy status to other drugs, medicaments and biological substances status: Secondary | ICD-10-CM | POA: Insufficient documentation

## 2012-01-26 DIAGNOSIS — C768 Malignant neoplasm of other specified ill-defined sites: Secondary | ICD-10-CM | POA: Insufficient documentation

## 2012-01-26 DIAGNOSIS — M25519 Pain in unspecified shoulder: Secondary | ICD-10-CM | POA: Insufficient documentation

## 2012-01-26 DIAGNOSIS — R569 Unspecified convulsions: Secondary | ICD-10-CM | POA: Insufficient documentation

## 2012-01-26 DIAGNOSIS — W19XXXA Unspecified fall, initial encounter: Secondary | ICD-10-CM | POA: Insufficient documentation

## 2012-01-26 DIAGNOSIS — S43109A Unspecified dislocation of unspecified acromioclavicular joint, initial encounter: Secondary | ICD-10-CM | POA: Insufficient documentation

## 2012-01-26 DIAGNOSIS — F17200 Nicotine dependence, unspecified, uncomplicated: Secondary | ICD-10-CM | POA: Insufficient documentation

## 2012-01-26 DIAGNOSIS — Y9361 Activity, american tackle football: Secondary | ICD-10-CM | POA: Insufficient documentation

## 2012-01-26 MED ORDER — ONDANSETRON 4 MG PO TBDP
4.0000 mg | ORAL_TABLET | Freq: Once | ORAL | Status: AC
  Administered 2012-01-26: 4 mg via ORAL
  Filled 2012-01-26: qty 1

## 2012-01-26 MED ORDER — HYDROCODONE-ACETAMINOPHEN 5-500 MG PO TABS: 1.0000 | ORAL_TABLET | Freq: Four times a day (QID) | ORAL | Status: AC | PRN

## 2012-01-26 MED ORDER — HYDROMORPHONE HCL PF 1 MG/ML IJ SOLN
1.0000 mg | Freq: Once | INTRAMUSCULAR | Status: AC
  Administered 2012-01-26: 1 mg via INTRAMUSCULAR
  Filled 2012-01-26: qty 1

## 2012-01-26 NOTE — ED Provider Notes (Signed)
I saw and evaluated the patient, reviewed the resident's note and I agree with the findings and plan.  Fell on R shoulder while playing football and heard pop.  Hx rotator cuff surgery on same shoulder. No weakness, numbness tingling. Decreased abduction of R shoulder  Glynn Octave, MD 01/26/12 1542

## 2012-01-26 NOTE — Discharge Instructions (Signed)
Acromioclavicular Injuries  The AC (acromioclavicular) joint is the joint in the shoulder where the collarbone (clavicle) meets the shoulder blade (scapula). The part of the shoulder blade connected to the collarbone is called the acromion. Common problems with and treatments for the AC joint are detailed below.  ARTHRITIS  Arthritis occurs when the joint has been injured and the smooth padding between the joints (cartilage) is lost. This is the wear and tear seen in most joints of the body if they have been overused. This causes the joint to produce pain and swelling which is worse with activity.   AC JOINT SEPARATION  AC joint separation means that the ligaments connecting the acromion of the shoulder blade and collarbone have been damaged, and the two bones no longer line up. AC separations can be anywhere from mild to severe, and are "graded" depending upon which ligaments are torn and how badly they are torn.   Grade I Injury: the least damage is done, and the AC joint still lines up.   Grade II Injury: damage to the ligaments which reinforce the AC joint. In a Grade II injury, these ligaments are stretched but not entirely torn. When stressed, the AC joint becomes painful and unstable.   Grade III Injury: AC and secondary ligaments are completely torn, and the collarbone is no longer attached to the shoulder blade. This results in deformity; a prominence of the end of the clavicle.  AC JOINT FRACTURE  AC joint fracture means that there has been a break in the bones of the AC joint, usually the end of the clavicle.  TREATMENT  TREATMENT OF AC ARTHRITIS   There is currently no way to replace the cartilage damaged by arthritis. The best way to improve the condition is to decrease the activities which aggravate the problem. Application of ice to the joint helps decrease pain and soreness (inflammation). The use of non-steroidal anti-inflammatory medication is helpful.   If less conservative measures do not  work, then cortisone shots (injections) may be used. These are anti-inflammatories; they decrease the soreness in the joint and swelling.   If non-surgical measures fail, surgery may be recommended. The procedure is generally removal of a portion of the end of the clavicle. This is the part of the collarbone closest to your acromion which is stabilized with ligaments to the acromion of the shoulder blade. This surgery may be performed using a tube-like instrument with a light (arthroscope) for looking into a joint. It may also be performed as an open surgery through a small incision by the surgeon. Most patients will have good range of motion within 6 weeks and may return to all activity including sports by 8-12 weeks, barring complications.  TREATMENT OF AN AC SEPARATION   The initial treatment is to decrease pain. This is best accomplished by immobilizing the arm in a sling and placing an ice pack to the shoulder for 20 to 30 minutes every 2 hours as needed. As the pain starts to subside, it is important to begin moving the fingers, wrist, elbow and eventually the shoulder in order to prevent a stiff or "frozen" shoulder. Instruction on when and how much to move the shoulder will be provided by your caregiver. The length of time needed to regain full motion and function depends on the amount or grade of the injury. Recovery from a Grade I AC separation usually takes 10 to 14 days, whereas a Grade III may take 6 to 8 weeks.   Grade   III injuries usually allow return to full activity with few restrictions. Treatment is also based on the activity demands of the injured shoulder. For example, a high level quarterback with an injured throwing arm will receive more aggressive treatment than someone with a desk job who rarely uses his/her arm for strenuous activities. In some cases, a painful lump may persist which could require a later surgery.  Surgery can be very successful, but the benefits must be weighed against the potential risks.  TREATMENT OF AN AC JOINT FRACTURE Fracture treatment depends on the type of fracture. Sometimes a splint or sling may be all that is required. Other times surgery may be required for repair. This is more frequently the case when the ligaments supporting the clavicle are completely torn. Your caregiver will help you with these decisions and together you can decide what will be the best treatment. HOME CARE INSTRUCTIONS   Apply ice to the injury for 15 to 20 minutes each hour while awake for 2 days. Put the ice in a plastic bag and place a towel between the bag of ice and skin.   If a sling has been applied, wear it constantly for as long as directed by your caregiver, even at night. The sling or splint can be removed for bathing or showering or as directed. Be sure to keep the shoulder in the same place as when the sling is on. Do not lift the arm.   If a figure-of-eight splint has been applied it should be tightened gently by another person every day. Tighten it enough to keep the shoulders held back. Allow enough room to place the index finger between the body and strap. Loosen the splint immediately if there is numbness or tingling in the hands.   Take over-the-counter or prescription medicines for pain, discomfort or fever as directed by your caregiver.   If you or your child has received a follow up appointment, it is very important to keep that appointment in order to avoid long term complications, chronic pain or disability.  SEEK MEDICAL CARE IF:   The pain is not relieved with medications.   There is increased swelling or discoloration that continues to get worse rather than better.   You or your child has been unable to follow up as instructed.   There is progressive numbness and tingling in the arm, forearm or hand.  SEEK IMMEDIATE MEDICAL CARE IF:   The arm is numb, cold or pale.    There is increasing pain in the hand, forearm or fingers.  MAKE SURE YOU:   Understand these instructions.   Will watch your condition.   Will get help right away if you are not doing well or get worse.  Document Released: 04/27/2005 Document Revised: 07/07/2011 Document Reviewed: 10/20/2008 [facility] Patient Information 2012 [place], [place].Arthralgia Your caregiver has diagnosed you as suffering from an arthralgia. Arthralgia means there is pain in a joint. This can come from many reasons including:  Bruising the joint which causes soreness (inflammation) in the joint.   Wear and tear on the joints which occur as we grow older (osteoarthritis).   Overusing the joint.   Various forms of arthritis.   Infections of the joint.  Regardless of the cause of pain in your joint, most of these different pains respond to anti-inflammatory drugs and rest. The exception to this is when a joint is infected, and these cases are treated with antibiotics, if it is a bacterial infection. HOME CARE INSTRUCTIONS  Rest the injured area for as long as directed by your caregiver. Then slowly start using the joint as directed by your caregiver and as the pain allows. Crutches as directed may be useful if the ankles, knees or hips are involved. If the knee was splinted or casted, continue use and care as directed. If an stretchy or elastic wrapping bandage has been applied today, it should be removed and re-applied every 3 to 4 hours. It should not be applied tightly, but firmly enough to keep swelling down. Watch toes and feet for swelling, bluish discoloration, coldness, numbness or excessive pain. If any of these problems (symptoms) occur, remove the ace bandage and re-apply more loosely. If these symptoms persist, contact your caregiver or return to this location.   For the first 24 hours, keep the injured extremity elevated on pillows while lying down.   Apply ice for 15 to 20 minutes to the sore joint  every couple hours while awake for the first half day. Then 3 to 4 times per day for the first 48 hours. Put the ice in a plastic bag and place a towel between the bag of ice and your skin.   Wear any splinting, casting, elastic bandage applications, or slings as instructed.   Only take over-the-counter or prescription medicines for pain, discomfort, or fever as directed by your caregiver. Do not use aspirin immediately after the injury unless instructed by your physician. Aspirin can cause increased bleeding and bruising of the tissues.   If you were given crutches, continue to use them as instructed and do not resume weight bearing on the sore joint until instructed.  Persistent pain and inability to use the sore joint as directed for more than 2 to 3 days are warning signs indicating that you should see a caregiver for a follow-up visit as soon as possible. Initially, a hairline fracture (break in bone) may not be evident on X-rays. Persistent pain and swelling indicate that further evaluation, non-weight bearing or use of the joint (use of crutches or slings as instructed), or further X-rays are indicated. X-rays may sometimes not show a small fracture until a week or 10 days later. Make a follow-up appointment with your own caregiver or one to whom we have referred you. A radiologist (specialist in reading X-rays) may read your X-rays. Make sure you know how you are to obtain your X-ray results. Do not assume everything is normal if you do not hear from [place]. SEEK MEDICAL CARE IF: Bruising, swelling, or pain increases. SEEK IMMEDIATE MEDICAL CARE IF:   Your fingers or toes are numb or blue.   The pain is not responding to medications and continues to stay the same or get worse.   The pain in your joint becomes severe.   You develop a fever over 102 F (38.9 C).   It becomes impossible to move or use the joint.  MAKE SURE YOU:   Understand these instructions.   Will watch your condition.     Will get help right away if you are not doing well or get worse.  Document Released: 07/18/2005 Document Revised: 07/07/2011 Document Reviewed: 03/05/2008 Shoulder Pain The shoulder is a ball and socket joint. The muscles and tendons (rotator cuff) are what keep the shoulder in its joint and stable. This collection of muscles and tendons holds in the head (ball) of the humerus (upper arm bone) in the fossa (cup) of the scapula (shoulder blade). Today no reason was found for your shoulder  pain. Often pain in the shoulder may be treated conservatively with temporary immobilization. For example, holding the shoulder in one place using a sling for rest. Physical therapy may be needed if problems continue. HOME CARE INSTRUCTIONS   Apply ice to the sore area for 15 to 20 minutes, 3 to 4 times per day for the first 2 days. Put the ice in a plastic bag. Place a towel between the bag of ice and your skin.   If you have or were given a shoulder sling and straps, do not remove for as long as directed by your caregiver or until you see a caregiver for a follow-up examination. If you need to remove it to shower or bathe, move your arm as little as possible.   Sleep on several pillows at night to lessen swelling and pain.   Only take over-the-counter or prescription medicines for pain, discomfort, or fever as directed by your caregiver.   Keep any follow-up appointments in order to avoid any type of permanent shoulder disability or chronic pain problems.  SEEK MEDICAL CARE IF:   Pain in your shoulder increases or new pain develops in your arm, hand, or fingers.   Your hand or fingers are colder than your other hand.   You do not obtain pain relief with the medications or your pain becomes worse.  SEEK IMMEDIATE MEDICAL CARE IF:   Your arm, hand, or fingers are numb or tingling.   Your arm, hand, or fingers are swollen, painful, or turn white or blue.   You develop chest pain or shortness of breath.   MAKE SURE YOU:   Understand these instructions.   Will watch your condition.   Will get help right away if you are not doing well or get worse.  Document Released: 04/27/2005 Document Revised: 07/07/2011 Document Reviewed: 07/02/2011 [facility] Patient Information 2012 [place], [place].Shoulder Separation You have a shoulder separation (acromio-clavicular separation). This occurs when an injury stretches or tears the ligaments that connect the top of the shoulder blade to the collar bone. Injury causes these bones to separate. A sling or splint may be applied to limit your range of motion. HOME CARE INSTRUCTIONS   Apply ice to the injury for 15 to 20 minutes each hour while awake for the first 2 days. Put the ice in a plastic bag. Place a towel between the bag of ice and your skin.   Avoid activities that increase the pain.   Wear your sling or splint for as long as directed by your caregiver. If you remove the sling to dress or bathe, be sure your arm remains in the same position as when the sling is on. Do not lift your arm.   The splint must be tightened by another person every day. Tighten it enough to keep the shoulders held back. Allow enough room to place the index finger between the body and strap. Loosen the splint immediately if you feel numbness or tingling in your hands.   Only take over-the-counter or prescription medicines for pain, discomfort, or fever as directed by your caregiver.   If this is a severe injury, you may need a referral to a specialist. It is very important to keep any follow-up appointments in order to avoid any type of permanent shoulder disability or chronic pain problems.  SEEK MEDICAL CARE IF:  Pain or swelling to the injury increases and is not relieved with medications. SEEK IMMEDIATE MEDICAL CARE IF:  Your arm becomes numb, pale, cold, or  there are abnormal feelings (sensations) shooting into your fingertips. Document Released: 04/27/2005 Document  Revised: 07/07/2011 Document Reviewed: 02/27/2007 [facility] Patient Information 2012 [place], [place].Sling Use, Shoulder, After Stroke Shoulder pain is very common in a paralyzed shoulder following a stroke (Cerebral Vascular Accident or CVA). A large number of patients develop a shoulder that is constantly falling out of place (dislocating). This is because following a stroke, the muscles that used to keep the shoulder in joint are no longer strong enough to hold the shoulder in place. In this case, sometimes a sling may offer enough support to help with some of the pain in that shoulder. Often times if your shoulder has some strength remaining and your strength can be increased, you may be able to do away with the sling. HOME CARE INSTRUCTIONS   During times when you shoulder is very sore you may use ice on the shoulder area for 15 to 20 minutes, 3 to 4 times per day while awake. Put the ice in a plastic bag and place a towel between the bag of ice and your skin. Do not put the ice pack directly on your skin. Do this only if it seems to help the discomfort. It is not necessary if it does not give relief.   Only take over-the-counter or prescription medicines for pain, discomfort, or fever as directed by your caregiver.   Wear your sling as directed by your caregiver. You may remove your sling for bathing or showering unless instructed not to by your caregiver. Replace the sling when through bathing or showering.   Do not use your shoulder until instructed to by your caregiver.   If you have been prescribed physical therapy, keep appointments as directed. You may be given physical therapy exercises to do at home; doing them as instructed will help to strengthen your shoulder.   If you still have some muscles working in the paralyzed or weakened shoulder and range of motion exercises are permitted by your caregiver, gently exercise them. Do not go over the limits suggested. If you have increased pain  from doing gentle exercises, back off or stop the exercises until you see your caregiver again.  SEEK MEDICAL CARE IF:   You have an increase in bruising, swelling or pain in your shoulder.   Your pain is not relieved with medicine or your shoulder seems to be getting worse.  Document Released: 06/29/2004 Document Revised: 07/07/2011 Document Reviewed: 10/20/2008 [facility] Patient Information 2012 [place], [place].

## 2012-01-26 NOTE — ED Notes (Addendum)
Reports heard a "pop" in right shoulder while playing football.  Has had right rotator cuff repaired in past

## 2012-01-26 NOTE — ED Notes (Signed)
Pt complains of pain in right shoulder, sts may be dislocation from playing football.

## 2012-01-26 NOTE — ED Provider Notes (Signed)
History     CSN: [id number]  Arrival date & time 01/26/12  1233   First MD Initiated Contact with Patient 01/26/12 1250      Chief Complaint  Patient presents with  . Shoulder Injury    Patient is a 26 y.o. male presenting with shoulder injury. The history is provided by the patient.  Shoulder Injury This is a new problem. The current episode started today (Patient was playing football and fell on his right shoulder and states he "felt a pop"). The problem occurs constantly (Constant pain since injury and decreased ability to abduct shoulder). The problem has been unchanged. Associated symptoms include arthralgias (right shoulder pain at site of fall). Pertinent negatives include no abdominal pain, chest pain, chills, coughing, fever, headaches, joint swelling, myalgias, nausea, neck pain, rash, vomiting or weakness. Exacerbated by: abducting the shoulder. He has tried nothing for the symptoms.    Past Medical History  Diagnosis Date  . Glioblastoma   . Seizures   . Post traumatic stress disorder     Past Surgical History  Procedure Date  . Appendectomy     No family history on file.  History  Substance Use Topics  . Smoking status: Current Everyday Smoker -- 1.0 packs/day    Types: Cigarettes  . Smokeless tobacco: Not on file  . Alcohol Use: Yes      Review of Systems  Constitutional: Negative for fever and chills.  HENT: Negative for neck pain.   Respiratory: Negative for cough, chest tightness, shortness of breath and wheezing.   Cardiovascular: Negative for chest pain, palpitations and leg swelling.  Gastrointestinal: Negative for nausea, vomiting, abdominal pain, diarrhea and constipation.  Musculoskeletal: Positive for arthralgias (right shoulder pain at site of fall). Negative for myalgias, back pain, joint swelling and gait problem.  Skin: Negative for rash and wound.  Neurological: Negative for dizziness, syncope, weakness and headaches.  All other systems  reviewed and are negative.    Allergies  Benadryl and Penicillins  Home Medications   Current Outpatient Rx  Name Route Sig Dispense Refill  . CLONIDINE HCL 0.1 MG PO TABS  For opiate withdrawal, take two more on 25th, 4 on the 26th, 2 on 27th, and 28th then stop. Divided doses 10 tablet 0    BP 149/92  Pulse 106  Temp 97.5 F (36.4 C) (Oral)  Resp 18  SpO2 97%  Physical Exam  Nursing note and vitals reviewed. Constitutional: He appears well-developed and well-nourished.       Appears in pain   HENT:  Head: Normocephalic and atraumatic.  Right Ear: External ear normal.  Left Ear: External ear normal.  Nose: Nose normal.  Mouth/Throat: Oropharynx is clear and moist. No oropharyngeal exudate.  Eyes: Conjunctivae are normal. Pupils are equal, round, and reactive to light.  Neck: Normal range of motion. Neck supple.  Cardiovascular: Normal rate, regular rhythm, normal heart sounds and intact distal pulses.   Pulmonary/Chest: Effort normal and breath sounds normal. No respiratory distress. He has no wheezes. He has no rales. He exhibits no tenderness.  Abdominal: Soft. Bowel sounds are normal. He exhibits no distension and no mass. There is no tenderness. There is no rebound and no guarding.  Musculoskeletal: Normal range of motion. He exhibits tenderness (overlying right AC joint and right posterior acromion). He exhibits no edema.       Neurovascularly intact distal to the shoulder Decreased abduction of right shoulder (can only abduct to 40 degrees).  When I abduct his  shoulder passively, he is unable to maintain abduction  Neurological: He is alert. He displays normal reflexes. He exhibits normal muscle tone. Coordination normal.  Skin: Skin is warm and dry. No rash noted. No erythema. No pallor.  Psychiatric: He has a normal mood and affect. His behavior is normal. Judgment and thought content normal.    ED Course  Procedures (including critical care time)  Labs  Reviewed - No data to display Dg Shoulder Right  01/26/2012  *RADIOLOGY REPORT*  Clinical Data: History of fall complaining of pain in the right shoulder.  RIGHT SHOULDER - 2+ VIEW  Comparison: No priors.  Findings: Three views of the right shoulder demonstrate no acute fracture, subluxation, dislocation, joint or soft tissue abnormality.  IMPRESSION: 1.  No acute radiographic abnormality of the right shoulder.  Original Report Authenticated By: Florencia Reasons, M.D.     1. Acromioclavicular joint separation, type 1   2. Shoulder pain       MDM  26 yo M presents after fall onto right shoulder while playing football. He has had a rotator cuff surgery to the same shoulder approximately 2 yrs ago in [place], [place]. Tenderness overlying AC joint and posterior acromion. Patient did not hit head or have loss of consciousness. Patient with difficulty abducting the shoulder and keeping the shoulder abducted after I passively abduct the shoulder. Patient has history of rotator cuff repair of this shoulder and I am concerned that he has re-injured the rotator cuff on exam. Exam and imaging not consistent with shoulder dislocation or high grade AC joint separation; however, suspect type 1 AC joint dislocation. Pain treated symptomatically in ED and sling immobilizer provided for comfort. Patient given return precautions, including worsening of signs or symptoms. Patient instructed to follow-up with Orthopedic Surgery regarding shoulder sprain and possible re-injury of rotator cuff repair.         Clemetine Marker, MD 01/26/12 1535

## 2012-01-26 NOTE — Progress Notes (Signed)
Orthopedic Tech Progress Note Patient Details:  Alex Sandoval 1986/05/01 [id number]  Ortho Devices Type of Ortho Device: Sling immobilizer Ortho Device/Splint Location: right arm Ortho Device/Splint Interventions: Application   Nikki Dom 01/26/2012, 2:53 PM

## 2012-12-30 ENCOUNTER — Emergency Department (HOSPITAL_COMMUNITY)
Admission: EM | Admit: 2012-12-30 | Discharge: 2012-12-30 | Disposition: A | Discharge status: Home / Self Care | Payer: Medicaid Other | Attending: Emergency Medicine | Admitting: Emergency Medicine

## 2012-12-30 ENCOUNTER — Encounter (HOSPITAL_COMMUNITY): Payer: Self-pay | Admitting: *Deleted

## 2012-12-30 ENCOUNTER — Emergency Department (HOSPITAL_COMMUNITY): Payer: Medicaid Other

## 2012-12-30 DIAGNOSIS — M24419 Recurrent dislocation, unspecified shoulder: Secondary | ICD-10-CM | POA: Insufficient documentation

## 2012-12-30 DIAGNOSIS — Z8659 Personal history of other mental and behavioral disorders: Secondary | ICD-10-CM | POA: Insufficient documentation

## 2012-12-30 DIAGNOSIS — R209 Unspecified disturbances of skin sensation: Secondary | ICD-10-CM | POA: Insufficient documentation

## 2012-12-30 DIAGNOSIS — Z8669 Personal history of other diseases of the nervous system and sense organs: Secondary | ICD-10-CM | POA: Insufficient documentation

## 2012-12-30 DIAGNOSIS — W2209XA Striking against other stationary object, initial encounter: Secondary | ICD-10-CM | POA: Insufficient documentation

## 2012-12-30 DIAGNOSIS — Z880 Allergy status to penicillin: Secondary | ICD-10-CM | POA: Insufficient documentation

## 2012-12-30 DIAGNOSIS — M24411 Recurrent dislocation, right shoulder: Secondary | ICD-10-CM

## 2012-12-30 DIAGNOSIS — F17200 Nicotine dependence, unspecified, uncomplicated: Secondary | ICD-10-CM | POA: Insufficient documentation

## 2012-12-30 DIAGNOSIS — Y9289 Other specified places as the place of occurrence of the external cause: Secondary | ICD-10-CM | POA: Insufficient documentation

## 2012-12-30 DIAGNOSIS — I10 Essential (primary) hypertension: Secondary | ICD-10-CM | POA: Insufficient documentation

## 2012-12-30 DIAGNOSIS — Z79899 Other long term (current) drug therapy: Secondary | ICD-10-CM | POA: Insufficient documentation

## 2012-12-30 DIAGNOSIS — Y9389 Activity, other specified: Secondary | ICD-10-CM | POA: Insufficient documentation

## 2012-12-30 HISTORY — DX: Essential (primary) hypertension: I10

## 2012-12-30 MED ORDER — KETAMINE HCL 10 MG/ML IJ SOLN
100.0000 mg | Freq: Once | INTRAMUSCULAR | Status: AC
  Administered 2012-12-30: 10 mg via INTRAVENOUS
  Filled 2012-12-30: qty 10

## 2012-12-30 MED ORDER — PROPOFOL 10 MG/ML IV BOLUS
0.5000 mg/kg | Freq: Once | INTRAVENOUS | Status: AC
  Administered 2012-12-30: 10 mg via INTRAVENOUS
  Filled 2012-12-30: qty 1

## 2012-12-30 MED ORDER — OXYCODONE-ACETAMINOPHEN 5-325 MG PO TABS: ORAL_TABLET | ORAL | Status: DC

## 2012-12-30 MED ORDER — FENTANYL CITRATE 0.05 MG/ML IJ SOLN
50.0000 ug | Freq: Once | INTRAMUSCULAR | Status: AC
  Administered 2012-12-30: 50 ug via INTRAVENOUS
  Filled 2012-12-30: qty 2

## 2012-12-30 MED ORDER — HYDROMORPHONE HCL PF 1 MG/ML IJ SOLN
1.0000 mg | Freq: Once | INTRAMUSCULAR | Status: AC
  Administered 2012-12-30: 1 mg via INTRAVENOUS
  Filled 2012-12-30: qty 1
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
## 2012-12-30 NOTE — ED Notes (Signed)
Pt reports during xray, shoulder popped back out of place. MD Freida Busman notified

## 2012-12-30 NOTE — ED Notes (Signed)
Pharmacy tech came to pick up unused ketamine

## 2012-12-30 NOTE — ED Notes (Signed)
Ice pack given to pt.

## 2012-12-30 NOTE — ED Notes (Signed)
Pt reports chronic shoulder pain from football injury
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
## 2012-12-30 NOTE — ED Notes (Signed)
Alex Sandoval, pharmacy tech, walked the remainder of propofol and ketamine that was mixed today (13mg ) back to pharmacy

## 2012-12-30 NOTE — ED Provider Notes (Signed)
Medical screening examination/treatment/procedure(s) were conducted as a shared visit with non-physician practitioner(s) and myself.  I personally evaluated the patient during the encounter  Pt Had his shoulder reduced here that showed good function and clinical alignment. While the patient was having his x-ray he states his shoulder became dislocated again. I spoke with him at length and he admits that his shoulder is chronically dislocated. He has surgery scheduled for next week. He has no neurological impairment at this time. Will be given shoulder immobilizer and pain medication and followup with Dr.  Toy Baker, MD 12/30/12 [phone]

## 2012-12-30 NOTE — ED Provider Notes (Signed)
Medical screening examination/treatment/procedure(s) were conducted as a shared visit with non-physician practitioner(s) and myself.  I personally evaluated the patient during the encounter  Toy Baker, MD 12/30/12 1558

## 2012-12-30 NOTE — ED Provider Notes (Signed)
Medical screening examination/treatment/procedure(s) were conducted as a shared visit with non-physician practitioner(s) and myself.  I personally evaluated the patient during the encounter     Toy Baker, MD 12/30/12 1542

## 2013-01-03 NOTE — ED Provider Notes (Signed)
Medical screening examination/treatment/procedure(s) were conducted as a shared visit with non-physician practitioner(s) and myself.  I personally evaluated the patient during the encounter  Toy Baker, MD 01/03/13 [phone]

## 2013-02-03 ENCOUNTER — Emergency Department (HOSPITAL_COMMUNITY): Payer: 59

## 2013-02-03 ENCOUNTER — Encounter (HOSPITAL_COMMUNITY): Payer: Self-pay | Admitting: *Deleted

## 2013-02-03 ENCOUNTER — Emergency Department (HOSPITAL_COMMUNITY)
Admission: EM | Admit: 2013-02-03 | Discharge: 2013-02-04 | Disposition: A | Discharge status: Home / Self Care | Payer: 59 | Attending: Emergency Medicine | Admitting: Emergency Medicine

## 2013-02-03 DIAGNOSIS — F17200 Nicotine dependence, unspecified, uncomplicated: Secondary | ICD-10-CM | POA: Insufficient documentation

## 2013-02-03 DIAGNOSIS — Z880 Allergy status to penicillin: Secondary | ICD-10-CM | POA: Insufficient documentation

## 2013-02-03 DIAGNOSIS — R071 Chest pain on breathing: Secondary | ICD-10-CM | POA: Insufficient documentation

## 2013-02-03 DIAGNOSIS — M25511 Pain in right shoulder: Secondary | ICD-10-CM

## 2013-02-03 DIAGNOSIS — Z888 Allergy status to other drugs, medicaments and biological substances status: Secondary | ICD-10-CM | POA: Insufficient documentation

## 2013-02-03 DIAGNOSIS — Z79899 Other long term (current) drug therapy: Secondary | ICD-10-CM | POA: Insufficient documentation

## 2013-02-03 DIAGNOSIS — M25519 Pain in unspecified shoulder: Secondary | ICD-10-CM | POA: Insufficient documentation

## 2013-02-03 DIAGNOSIS — R209 Unspecified disturbances of skin sensation: Secondary | ICD-10-CM | POA: Insufficient documentation

## 2013-02-03 DIAGNOSIS — Y998 Other external cause status: Secondary | ICD-10-CM | POA: Insufficient documentation

## 2013-02-03 DIAGNOSIS — Y929 Unspecified place or not applicable: Secondary | ICD-10-CM | POA: Insufficient documentation

## 2013-02-03 DIAGNOSIS — F4310 Post-traumatic stress disorder, unspecified: Secondary | ICD-10-CM | POA: Insufficient documentation

## 2013-02-03 DIAGNOSIS — R569 Unspecified convulsions: Secondary | ICD-10-CM | POA: Insufficient documentation

## 2013-02-03 DIAGNOSIS — S2239XA Fracture of one rib, unspecified side, initial encounter for closed fracture: Secondary | ICD-10-CM | POA: Insufficient documentation

## 2013-02-03 DIAGNOSIS — I10 Essential (primary) hypertension: Secondary | ICD-10-CM | POA: Insufficient documentation

## 2013-02-03 DIAGNOSIS — S2231XA Fracture of one rib, right side, initial encounter for closed fracture: Secondary | ICD-10-CM

## 2013-02-03 DIAGNOSIS — M25559 Pain in unspecified hip: Secondary | ICD-10-CM | POA: Insufficient documentation

## 2013-02-03 DIAGNOSIS — R109 Unspecified abdominal pain: Secondary | ICD-10-CM | POA: Insufficient documentation

## 2013-02-03 DIAGNOSIS — F1910 Other psychoactive substance abuse, uncomplicated: Secondary | ICD-10-CM | POA: Insufficient documentation

## 2013-02-03 DIAGNOSIS — Z7982 Long term (current) use of aspirin: Secondary | ICD-10-CM | POA: Insufficient documentation

## 2013-02-03 MED ORDER — ONDANSETRON HCL 4 MG/2ML IJ SOLN: 4.0000 mg | Freq: Once | INTRAMUSCULAR | Status: DC

## 2013-02-03 MED ORDER — HYDROMORPHONE HCL PF 1 MG/ML IJ SOLN
INTRAMUSCULAR | Status: AC
  Filled 2013-02-03: qty 1

## 2013-02-03 MED ORDER — IOHEXOL 300 MG/ML  SOLN
100.0000 mL | Freq: Once | INTRAMUSCULAR | Status: AC | PRN
  Administered 2013-02-03: 100 mL via INTRAVENOUS

## 2013-02-03 MED ORDER — HYDROMORPHONE HCL PF 1 MG/ML IJ SOLN
1.0000 mg | Freq: Once | INTRAMUSCULAR | Status: AC
  Administered 2013-02-03: 1 mg via INTRAVENOUS

## 2013-02-03 MED ORDER — ONDANSETRON HCL 4 MG/2ML IJ SOLN
INTRAMUSCULAR | Status: AC
  Filled 2013-02-03: qty 2

## 2013-02-03 MED ORDER — HYDROMORPHONE HCL PF 1 MG/ML IJ SOLN
1.0000 mg | Freq: Once | INTRAMUSCULAR | Status: AC
  Administered 2013-02-03: 1 mg via INTRAVENOUS
  Filled 2013-02-03: qty 1

## 2013-02-03 MED ORDER — ONDANSETRON HCL 4 MG/2ML IJ SOLN
4.0000 mg | Freq: Once | INTRAMUSCULAR | Status: AC
  Administered 2013-02-03: 4 mg via INTRAVENOUS

## 2013-02-03 MED ORDER — PROPOFOL 10 MG/ML IV BOLUS
150.0000 ug/kg | Freq: Once | INTRAVENOUS | Status: DC
  Filled 2013-02-03 (×2): qty 20

## 2013-02-03 MED ORDER — SODIUM CHLORIDE 0.9 % IV SOLN
Freq: Once | INTRAVENOUS | Status: AC
  Administered 2013-02-03: 21:00:00 via INTRAVENOUS

## 2013-02-03 NOTE — ED Notes (Signed)
Dr Jeraldine Loots in to manipulate right shoulder.  Shoulder appears to be in the proper position.  +CNS
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
## 2013-02-03 NOTE — ED Notes (Signed)
No answer in waiting.  

## 2013-02-04 ENCOUNTER — Emergency Department (HOSPITAL_COMMUNITY)
Admission: EM | Admit: 2013-02-04 | Discharge: 2013-02-04 | Disposition: A | Discharge status: Home / Self Care | Payer: 59 | Attending: Emergency Medicine | Admitting: Emergency Medicine

## 2013-02-04 ENCOUNTER — Encounter (HOSPITAL_COMMUNITY): Payer: Self-pay | Admitting: Vascular Surgery

## 2013-02-04 ENCOUNTER — Emergency Department (HOSPITAL_COMMUNITY): Payer: 59

## 2013-02-04 DIAGNOSIS — F17200 Nicotine dependence, unspecified, uncomplicated: Secondary | ICD-10-CM | POA: Insufficient documentation

## 2013-02-04 DIAGNOSIS — R059 Cough, unspecified: Secondary | ICD-10-CM | POA: Insufficient documentation

## 2013-02-04 DIAGNOSIS — Z8669 Personal history of other diseases of the nervous system and sense organs: Secondary | ICD-10-CM | POA: Insufficient documentation

## 2013-02-04 DIAGNOSIS — Z880 Allergy status to penicillin: Secondary | ICD-10-CM | POA: Insufficient documentation

## 2013-02-04 DIAGNOSIS — Z79899 Other long term (current) drug therapy: Secondary | ICD-10-CM | POA: Insufficient documentation

## 2013-02-04 DIAGNOSIS — F411 Generalized anxiety disorder: Secondary | ICD-10-CM | POA: Insufficient documentation

## 2013-02-04 DIAGNOSIS — R1013 Epigastric pain: Secondary | ICD-10-CM | POA: Insufficient documentation

## 2013-02-04 DIAGNOSIS — Z8659 Personal history of other mental and behavioral disorders: Secondary | ICD-10-CM | POA: Insufficient documentation

## 2013-02-04 DIAGNOSIS — K920 Hematemesis: Secondary | ICD-10-CM | POA: Insufficient documentation

## 2013-02-04 DIAGNOSIS — Z7982 Long term (current) use of aspirin: Secondary | ICD-10-CM | POA: Insufficient documentation

## 2013-02-04 DIAGNOSIS — I10 Essential (primary) hypertension: Secondary | ICD-10-CM | POA: Insufficient documentation

## 2013-02-04 DIAGNOSIS — Z87828 Personal history of other (healed) physical injury and trauma: Secondary | ICD-10-CM | POA: Insufficient documentation

## 2013-02-04 DIAGNOSIS — R05 Cough: Secondary | ICD-10-CM | POA: Insufficient documentation

## 2013-02-04 DIAGNOSIS — R109 Unspecified abdominal pain: Secondary | ICD-10-CM

## 2013-02-04 LAB — COMPREHENSIVE METABOLIC PANEL
Albumin: 3.9 g/dL (ref 3.5–5.2)
Alkaline Phosphatase: 77 U/L (ref 39–117)
BUN: 12 mg/dL (ref 6–23)
CO2: 25 mEq/L (ref 19–32)
Chloride: 105 mEq/L (ref 96–112)
GFR calc non Af Amer: >90 mL/min (ref >90)
Potassium: 4.3 mEq/L (ref 3.5–5.1)
Total Bilirubin: 0.2 mg/dL — ABNORMAL LOW (ref 0.3–1.2)

## 2013-02-04 LAB — CBC WITH DIFFERENTIAL/PLATELET
Basophils Relative: 1 % (ref 0–1)
HCT: 39.2 % (ref 39.0–52.0)
Hemoglobin: 13.6 g/dL (ref 13.0–17.0)
Lymphocytes Relative: 25 % (ref 12–46)
Lymphs Abs: 1.9 10*3/uL (ref 0.7–4.0)
MCHC: 34.7 g/dL (ref 30.0–36.0)
Monocytes Absolute: 0.4 10*3/uL (ref 0.1–1.0)
Monocytes Relative: 6 % (ref 3–12)
Neutro Abs: 5 10*3/uL (ref 1.7–7.7)
Neutrophils Relative %: 66 % (ref 43–77)
RBC: 4.35 MIL/uL (ref 4.22–5.81)
WBC: 7.5 10*3/uL (ref 4.0–10.5)

## 2013-02-04 LAB — LIPASE, BLOOD: Lipase: 30 U/L (ref 11–59)

## 2013-02-04 MED ORDER — ONDANSETRON 4 MG PO TBDP
4.0000 mg | ORAL_TABLET | Freq: Once | ORAL | Status: AC
  Administered 2013-02-04: 4 mg via ORAL
  Filled 2013-02-04: qty 1

## 2013-02-04 NOTE — ED Notes (Signed)
Ortho paged. Tech will come down and place the ordered sling.
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
## 2013-02-04 NOTE — Progress Notes (Deleted)
Orthopedic Tech Progress Note Patient Details:  Alex Sandoval [birth year] [id number]  Ortho Devices Type of Ortho Device: Sling immobilizer   Haskell Flirt 02/04/2013, 12:14 AM

## 2013-02-04 NOTE — ED Notes (Signed)
Patient up and dressed, waiting for the foam shoulder immobilizer.  Ortho tech here and did not bring the sling that the patient wanted.  Patient became very verbal and said he was leaving, refused to sign the signature pad.  This nurse instructed him that he could not drive due to the meds he received.   No one in the waiting room to take him home.  Appears to be using the phone to call for a ride.

## 2013-02-04 NOTE — ED Notes (Signed)
Patient amb to the BR to have a BM.  Steady gait noted going to the bathroom.    Back to bed

## 2013-02-04 NOTE — ED Notes (Signed)
Patient upset with Dr. Jeraldine Loots and not receiving any narcotics.
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
## 2013-02-05 ENCOUNTER — Encounter (HOSPITAL_COMMUNITY): Payer: Self-pay | Admitting: *Deleted

## 2013-02-05 ENCOUNTER — Emergency Department (HOSPITAL_COMMUNITY)
Admission: EM | Admit: 2013-02-05 | Discharge: 2013-02-05 | Disposition: A | Discharge status: Home / Self Care | Payer: 59 | Attending: Emergency Medicine | Admitting: Emergency Medicine

## 2013-02-05 DIAGNOSIS — F17200 Nicotine dependence, unspecified, uncomplicated: Secondary | ICD-10-CM | POA: Insufficient documentation

## 2013-02-05 DIAGNOSIS — R110 Nausea: Secondary | ICD-10-CM | POA: Insufficient documentation

## 2013-02-05 DIAGNOSIS — Z880 Allergy status to penicillin: Secondary | ICD-10-CM | POA: Insufficient documentation

## 2013-02-05 DIAGNOSIS — Z8659 Personal history of other mental and behavioral disorders: Secondary | ICD-10-CM | POA: Insufficient documentation

## 2013-02-05 DIAGNOSIS — Z8661 Personal history of infections of the central nervous system: Secondary | ICD-10-CM | POA: Insufficient documentation

## 2013-02-05 DIAGNOSIS — R259 Unspecified abnormal involuntary movements: Secondary | ICD-10-CM | POA: Insufficient documentation

## 2013-02-05 DIAGNOSIS — Z7982 Long term (current) use of aspirin: Secondary | ICD-10-CM | POA: Insufficient documentation

## 2013-02-05 DIAGNOSIS — I10 Essential (primary) hypertension: Secondary | ICD-10-CM | POA: Insufficient documentation

## 2013-02-05 DIAGNOSIS — Z79899 Other long term (current) drug therapy: Secondary | ICD-10-CM | POA: Insufficient documentation

## 2013-02-05 DIAGNOSIS — F1110 Opioid abuse, uncomplicated: Secondary | ICD-10-CM

## 2013-02-05 HISTORY — DX: Other psychoactive substance abuse, uncomplicated: F19.10

## 2013-02-05 LAB — ETHANOL: Alcohol, Ethyl (B): <11 mg/dL (ref 0–11)

## 2013-02-05 LAB — BASIC METABOLIC PANEL
Calcium: 9.6 mg/dL (ref 8.4–10.5)
Creatinine, Ser: 0.92 mg/dL (ref 0.50–1.35)
GFR calc Af Amer: >90 mL/min (ref >90)
GFR calc non Af Amer: >90 mL/min (ref >90)

## 2013-02-05 LAB — CBC WITH DIFFERENTIAL/PLATELET
Basophils Absolute: 0 10*3/uL (ref 0.0–0.1)
Basophils Relative: 1 % (ref 0–1)
Eosinophils Relative: 4 % (ref 0–5)
HCT: 44.7 % (ref 39.0–52.0)
MCHC: 33.8 g/dL (ref 30.0–36.0)
MCV: 91 fL (ref 78.0–100.0)
Monocytes Absolute: 0.5 10*3/uL (ref 0.1–1.0)
Neutro Abs: 2.4 10*3/uL (ref 1.7–7.7)
Platelets: 221 10*3/uL (ref 150–400)
RDW: 13.6 % (ref 11.5–15.5)

## 2013-02-05 MED ORDER — ONDANSETRON 4 MG PO TBDP
8.0000 mg | ORAL_TABLET | Freq: Once | ORAL | Status: AC
  Administered 2013-02-05: 8 mg via ORAL
  Filled 2013-02-05: qty 2

## 2013-02-05 MED ORDER — LORAZEPAM 1 MG PO TABS
2.0000 mg | ORAL_TABLET | Freq: Once | ORAL | Status: AC
  Administered 2013-02-05: 2 mg via ORAL
  Filled 2013-02-05: qty 2

## 2013-02-05 NOTE — [facility] Counselor (Signed)
Patient requesting detox services for opiates, however; his UDS is negative for opiates. 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 Patient sts, "It's no way b/c I used a bag of heroin before I came into the ED". Patient also stated that our EDP gave him dilaudid yesterday during his ED visit. Writer and patient's nurse reviewed patient's chart, which showed that patient was given dilaudid. Writer contacted Dr. Jeraldine Loots to request a repeat drug screening. During the mist of this conversation patient told his nurse Minerva Areola that he wanted to leave. Per Minerva Areola patient stated, "Since I don't have anything in my system I will go out and use...come back...maybe I will have something in my system then". Writer notified Dr. Jeraldine Loots that patient does not want to stay in the ED and is asking to be discharged. Dr. Jeraldine Loots agreed to discharge patient.   *Writer informed Dr. Jeraldine Loots that patient has reported opiate use and it may not be in his best interest to not give him such narcotics in the future.

## 2013-02-05 NOTE — ED Notes (Signed)
Pt requesting detox from opiates and heroin; last use yesterday of heroin at 1900; states feels pretty good right now; previous detox program two yrs ago
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
## 2013-02-05 NOTE — ED Provider Notes (Signed)
I saw the patient in conjunction with the resident (Dr. Malen Gauze) and agree with his documentation with the following addendum / supplemental findings. I also saw the radiographic reports / ECG as documented, and agree with the interpretation.  On my exam the patient initially had what seemed to be a deformity of the right shoulder.  This was improved with passive manipulation, and a subsequent x-ray was again consistent with the lack of dislocation.  A review of patient's chart is consistent with chronic subluxation and chronic pain. After the patient's evaluation here I reviewed his state drug database record.  I discussed this with the patient.  He has 130+ narcotic pills provided over the past 60 days.  The patient was counseled on the need to obtain all narcotic prescriptions from one provider. He was discharged in stable condition.  Gerhard Munch, MD 02/05/13 [phone]

## 2013-07-10 ENCOUNTER — Emergency Department (HOSPITAL_COMMUNITY): Payer: Medicaid - Out of State

## 2013-07-10 ENCOUNTER — Emergency Department (HOSPITAL_COMMUNITY)
Admission: EM | Admit: 2013-07-10 | Discharge: 2013-07-11 | Disposition: A | Discharge status: Home / Self Care | Payer: Medicaid - Out of State | Attending: Emergency Medicine | Admitting: Emergency Medicine

## 2013-07-10 ENCOUNTER — Encounter (HOSPITAL_COMMUNITY): Payer: Self-pay | Admitting: Emergency Medicine

## 2013-07-10 DIAGNOSIS — S0990XA Unspecified injury of head, initial encounter: Secondary | ICD-10-CM | POA: Diagnosis not present

## 2013-07-10 DIAGNOSIS — Y939 Activity, unspecified: Secondary | ICD-10-CM | POA: Insufficient documentation

## 2013-07-10 DIAGNOSIS — S4980XA Other specified injuries of shoulder and upper arm, unspecified arm, initial encounter: Secondary | ICD-10-CM | POA: Diagnosis present

## 2013-07-10 DIAGNOSIS — R079 Chest pain, unspecified: Secondary | ICD-10-CM | POA: Insufficient documentation

## 2013-07-10 DIAGNOSIS — S43016A Anterior dislocation of unspecified humerus, initial encounter: Secondary | ICD-10-CM | POA: Diagnosis not present

## 2013-07-10 DIAGNOSIS — S43004A Unspecified dislocation of right shoulder joint, initial encounter: Secondary | ICD-10-CM

## 2013-07-10 DIAGNOSIS — M542 Cervicalgia: Secondary | ICD-10-CM | POA: Insufficient documentation

## 2013-07-10 DIAGNOSIS — R569 Unspecified convulsions: Secondary | ICD-10-CM | POA: Diagnosis not present

## 2013-07-10 DIAGNOSIS — Z79899 Other long term (current) drug therapy: Secondary | ICD-10-CM | POA: Diagnosis not present

## 2013-07-10 DIAGNOSIS — F17200 Nicotine dependence, unspecified, uncomplicated: Secondary | ICD-10-CM | POA: Diagnosis not present

## 2013-07-10 DIAGNOSIS — R51 Headache: Secondary | ICD-10-CM | POA: Diagnosis not present

## 2013-07-10 DIAGNOSIS — F4310 Post-traumatic stress disorder, unspecified: Secondary | ICD-10-CM | POA: Diagnosis not present

## 2013-07-10 DIAGNOSIS — Z880 Allergy status to penicillin: Secondary | ICD-10-CM | POA: Diagnosis not present

## 2013-07-10 DIAGNOSIS — F1910 Other psychoactive substance abuse, uncomplicated: Secondary | ICD-10-CM | POA: Insufficient documentation

## 2013-07-10 DIAGNOSIS — S46909A Unspecified injury of unspecified muscle, fascia and tendon at shoulder and upper arm level, unspecified arm, initial encounter: Secondary | ICD-10-CM | POA: Diagnosis present

## 2013-07-10 DIAGNOSIS — S298XXA Other specified injuries of thorax, initial encounter: Secondary | ICD-10-CM | POA: Insufficient documentation

## 2013-07-10 DIAGNOSIS — Y929 Unspecified place or not applicable: Secondary | ICD-10-CM | POA: Insufficient documentation

## 2013-07-10 DIAGNOSIS — I10 Essential (primary) hypertension: Secondary | ICD-10-CM | POA: Insufficient documentation
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
## 2013-07-11 ENCOUNTER — Emergency Department (HOSPITAL_COMMUNITY): Payer: Medicaid - Out of State

## 2013-07-11 ENCOUNTER — Encounter (HOSPITAL_COMMUNITY): Payer: Self-pay | Admitting: Radiology

## 2013-07-11 MED ORDER — ONDANSETRON HCL 4 MG/2ML IJ SOLN
4.0000 mg | Freq: Once | INTRAMUSCULAR | Status: AC
  Administered 2013-07-11: 4 mg via INTRAVENOUS

## 2013-07-11 MED ORDER — ONDANSETRON HCL 4 MG/2ML IJ SOLN
4.0000 mg | Freq: Once | INTRAMUSCULAR | Status: AC
  Administered 2013-07-11: 4 mg via INTRAVENOUS
  Filled 2013-07-11: qty 2

## 2013-07-11 MED ORDER — LORAZEPAM 2 MG/ML IJ SOLN
1.0000 mg | Freq: Once | INTRAMUSCULAR | Status: AC
  Administered 2013-07-11: 1 mg via INTRAVENOUS
  Filled 2013-07-11: qty 1

## 2013-07-11 MED ORDER — HYDROMORPHONE HCL PF 1 MG/ML IJ SOLN
1.0000 mg | Freq: Once | INTRAMUSCULAR | Status: AC
  Administered 2013-07-11: 1 mg via INTRAVENOUS
  Filled 2013-07-11: qty 1

## 2013-07-11 MED ORDER — ONDANSETRON HCL 4 MG/2ML IJ SOLN
INTRAMUSCULAR | Status: AC
  Filled 2013-07-11: qty 2
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
## 2013-07-11 NOTE — ED Notes (Addendum)
Pt upset he is not being adm any more pain medication, pt wishes to speak with administration. Charge RN informed. PA and RN at bedside. Pt left without signing. Discharge instructions reviewed. Pt refused to allow staff to obtain updated VS. Pt uncooperative.

## 2013-07-15 ENCOUNTER — Encounter (HOSPITAL_COMMUNITY): Payer: Self-pay | Admitting: *Deleted

## 2013-07-17 NOTE — ED Provider Notes (Signed)
Patient is alert and oriented x3 with clear, goal oriented speech. Patient has 5/5 motor in all extremities. Sensation is intact to light touch. Bilateral finger-to-nose is normal with no signs of dysmetria. Patient has a normal gait and walks without assistance.   Loren Racer, MD 07/17/13 [phone]

## 2013-07-20 NOTE — ED Provider Notes (Signed)
Medical screening examination/treatment/procedure(s) were performed by non-physician practitioner and as supervising physician I was immediately available for consultation/collaboration.   Ebrima Ranta, MD 07/20/13 0502 

## 2014-01-13 LAB — URINALYSIS W/ REFLEX CULTURE
Bacteria: NEGATIVE /hpf
Bilirubin: NEGATIVE
Blood: NEGATIVE
Glucose: NEGATIVE mg/dL
Ketone: NEGATIVE mg/dL
Leukocyte Esterase: NEGATIVE
Nitrites: NEGATIVE
Protein: NEGATIVE mg/dL
Specific gravity: 1.015 (ref 1.003–1.030)
Urobilinogen: 0.2 EU/dL (ref 0.2–1.0)
pH (UA): 6 (ref 5.0–8.0)

## 2014-01-13 LAB — METABOLIC PANEL, COMPREHENSIVE
A-G Ratio: 1.1 (ref 1.1–2.2)
ALT (SGPT): 29 U/L (ref 12–78)
AST (SGOT): 16 U/L (ref 15–37)
Albumin: 4.1 g/dL (ref 3.5–5.0)
Alk. phosphatase: 89 U/L (ref 45–117)
Anion gap: 7 mmol/L (ref 5–15)
BUN/Creatinine ratio: 16 (ref 12–20)
BUN: 15 MG/DL (ref 6–20)
Bilirubin, total: 0.4 MG/DL (ref 0.2–1.0)
CO2: 26 mmol/L (ref 21–32)
Calcium: 9.3 MG/DL (ref 8.5–10.1)
Chloride: 104 mmol/L (ref 97–108)
Creatinine: 0.92 MG/DL (ref 0.45–1.15)
GFR est AA: >60 mL/min/{1.73_m2} (ref >60)
GFR est non-AA: >60 mL/min/{1.73_m2} (ref >60)
Globulin: 3.9 g/dL (ref 2.0–4.0)
Glucose: 119 mg/dL — ABNORMAL HIGH (ref 65–100)
Potassium: 3.8 mmol/L (ref 3.5–5.1)
Protein, total: 8 g/dL (ref 6.4–8.2)
Sodium: 137 mmol/L (ref 136–145)

## 2014-01-13 LAB — DRUG SCREEN, URINE
AMPHETAMINES: NEGATIVE
BARBITURATES: NEGATIVE
BENZODIAZEPINES: NEGATIVE
COCAINE: NEGATIVE
METHADONE: NEGATIVE
OPIATES: POSITIVE — AB
PCP(PHENCYCLIDINE): NEGATIVE
THC (TH-CANNABINOL): NEGATIVE

## 2014-01-13 LAB — CBC WITH AUTOMATED DIFF
ABS. BASOPHILS: 0 10*3/uL (ref 0.0–0.1)
ABS. EOSINOPHILS: 0.1 10*3/uL (ref 0.0–0.4)
ABS. LYMPHOCYTES: 1.4 10*3/uL (ref 0.8–3.5)
ABS. MONOCYTES: 0.5 10*3/uL (ref 0.0–1.0)
ABS. NEUTROPHILS: 2.4 10*3/uL (ref 1.8–8.0)
BASOPHILS: 0 % (ref 0–1)
EOSINOPHILS: 3 % (ref 0–7)
HCT: 40 % (ref 36.6–50.3)
HGB: 13.5 g/dL (ref 12.1–17.0)
LYMPHOCYTES: 31 % (ref 12–49)
MCH: 31.3 PG (ref 26.0–34.0)
MCHC: 33.8 g/dL (ref 30.0–36.5)
MCV: 92.6 FL (ref 80.0–99.0)
MONOCYTES: 12 % (ref 5–13)
NEUTROPHILS: 54 % (ref 32–75)
PLATELET: 233 10*3/uL (ref 150–400)
RBC: 4.32 M/uL (ref 4.10–5.70)
RDW: 13.8 % (ref 11.5–14.5)
WBC: 4.5 10*3/uL (ref 4.1–11.1)

## 2014-01-13 LAB — CK W/ CKMB & INDEX
CK - MB: 1.6 NG/ML (ref 0.5–3.6)
CK-MB Index: 0.5 (ref 0–2.5)
CK: 315 U/L — ABNORMAL HIGH (ref 39–308)

## 2014-01-13 LAB — PROTHROMBIN TIME + INR
INR: 1 (ref 0.9–1.1)
Prothrombin time: 10.4 s (ref 9.0–11.1)

## 2014-01-13 LAB — ETHYL ALCOHOL: ALCOHOL(ETHYL),SERUM: <10 MG/DL (ref <10)

## 2014-01-13 LAB — PTT: aPTT: 28.3 s (ref 22.1–32.5)

## 2014-01-13 LAB — LACTIC ACID: Lactic acid: 1.8 MMOL/L (ref 0.4–2.0)

## 2014-01-13 LAB — TROPONIN I: Troponin-I, Qt.: <0.04 ng/mL (ref <0.05)

## 2014-01-13 MED ORDER — SODIUM CHLORIDE 0.9 % IJ SYRG
Freq: Once | INTRAMUSCULAR | Status: AC
Start: 2014-01-13 — End: 2014-01-13
  Administered 2014-01-13: 17:00:00 via INTRAVENOUS

## 2014-01-13 MED ORDER — IOPAMIDOL 76 % IV SOLN
370 mg iodine /mL (76 %) | Freq: Once | INTRAVENOUS | Status: AC
Start: 2014-01-13 — End: 2014-01-13
  Administered 2014-01-13: 17:00:00 via INTRAVENOUS

## 2014-01-13 MED ORDER — SODIUM CHLORIDE 0.9% BOLUS IV
0.9 % | Freq: Once | INTRAVENOUS | Status: AC
Start: 2014-01-13 — End: 2014-01-13
  Administered 2014-01-13: 18:00:00 via INTRAVENOUS

## 2014-01-13 MED ORDER — HYDROCODONE-ACETAMINOPHEN 5 MG-325 MG TAB
5-325 mg | ORAL_TABLET | ORAL | Status: DC | PRN
Start: 2014-01-13 — End: 2014-04-11

## 2014-01-13 MED ADMIN — sodium chloride 0.9 % bolus infusion 1,000 mL: INTRAVENOUS | @ 16:00:00 | NDC 00409798309

## 2014-01-13 MED ADMIN — HYDROmorphone (PF) (DILAUDID) injection 0.5 mg: INTRAVENOUS | @ 18:00:00 | NDC 00409255201

## 2014-01-13 MED ADMIN — morphine injection 4 mg: INTRAVENOUS | @ 17:00:00 | NDC 00409189101

## 2014-01-13 MED ADMIN — ondansetron (ZOFRAN) injection 4 mg: INTRAVENOUS | @ 16:00:00 | NDC 00409475518

## 2014-01-13 MED ADMIN — morphine injection 4 mg: INTRAVENOUS | @ 16:00:00 | NDC 00409189101

## 2014-01-13 MED FILL — SODIUM CHLORIDE 0.9 % IV: INTRAVENOUS | Qty: 1000

## 2014-01-13 MED FILL — ONDANSETRON (PF) 4 MG/2 ML INJECTION: 4 mg/2 mL | INTRAMUSCULAR | Qty: 2

## 2014-01-13 MED FILL — SODIUM CHLORIDE 0.9 % IV: INTRAVENOUS | Qty: 100

## 2014-01-13 MED FILL — MORPHINE 4 MG/ML SYRINGE: 4 mg/mL | INTRAMUSCULAR | Qty: 1

## 2014-01-13 MED FILL — HYDROMORPHONE (PF) 1 MG/ML IJ SOLN: 1 mg/mL | INTRAMUSCULAR | Qty: 1

## 2014-01-13 MED FILL — ISOVUE-370  76 % INTRAVENOUS SOLUTION: 370 mg iodine /mL (76 %) | INTRAVENOUS | Qty: 100

## 2014-01-13 NOTE — Progress Notes (Addendum)
Spiritual Care Assessment/Progress Notes    Evan Woods [id number]  [id number]    06/16/1986  28 y.o.  male    Patient Telephone Number: [phone] (home)   Religious Affiliation: [employer] Kn[employer]   Language: English   No emergency contact information on file.   There are no active problems to display for this patient.       Date: 01/13/2014       Level of Religious/Spiritual Activity:  []          Involved in faith tradition/spiritual practice    []          Not involved in faith tradition/spiritual practice  [x]          Spiritually oriented    []          Claims no spiritual orientation    []          seeking spiritual identity  []          Feels alienated from religious practice/tradition  []          Feels angry about religious practice/tradition  []          Spirituality/religious tradition not a resource for coping at this time.  []          Not able to assess due to medical condition    Services Provided Today:  [x]          crisis intervention    []          reading Scriptures  [x]          spiritual assessment    [x]          prayer  [x]          empathic listening/emotional support  []          rites and rituals (cite in comments)  []          life review     []          religious support  []          theological development   [x]          advocacy  []          ethical dialog     []          blessing  [x]          bereavement support    []          support to family  []          anticipatory grief support   []          help with AMD  [x]          spiritual guidance    []          meditation      Spiritual Care Needs  [x]          Emotional Support  [x]          Spiritual/Religious Care  [x]          Loss/Adjustment  [x]          Advocacy/Referral /Ethics  []          No needs expressed at this time  []          Other: (note in comments)  Spiritual Care Plan  []          Follow up visits with pt/family  []          Provide materials  []          Schedule sacraments  []   Contact Community Clergy  []          Follow up as needed  [x]           Other: (note in comments)     Comments: Evan Woods came to the [facility] office after he had been discharged from the Emergency Department. He was in spiritual and emotional distress over the loss of his 29 year-old sister, who died in a car accident yesterday ([facility]). I provided spiritual and emotional support as he wrestled with the question of why this had to happen to his sister. I also provided him with a [facility] brochure and a calendar of support groups and services offered by the center at this time. I also coordinated with the cafeteria to provide him with meal vouchers, per his request, because he had not eaten since yesterday. I also prayed with him at the end of our interaction.     Rev. Knute Neuobert G. Doristine CounterShenk, M.Div, [facility]  Lead Chaplain

## 2014-01-13 NOTE — ED Provider Notes (Signed)
HPI Comments: 28 y.o. male with with past medical history significant for MI and 2 back surgeries who presents from a house with chief complaint of severe pain to right shoulder, neck and back x today onset after fall.  Pt explains he is here on vacation and was on a roof, felt lightheaded and fell off the roof.  Pt states he fell backwards, reached out his right arm to break his fall, landed on grass and hit his head.  Pt states he was able to ambulate after fall.  Shoulder pain exacerbated with movement.  Neck and back pain exacerbated with palpation.  Pt notes he is currently on Plavix and a beta blocker.  Pt denies abd pain, CP, leg pain.     There are no other acute medical concerns at this time.    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       The history is provided by the patient.        Past Medical History   Diagnosis Date   ??? Acute MI (HCC)    ??? Hypertension         Past Surgical History   Procedure Laterality Date   ??? Hx orthopaedic       shoulder surgery   ??? Hx appendectomy           No family history on file.     History     Social History   ??? Marital Status: DIVORCED     Spouse Name: N/A     Number of Children: N/A   ??? Years of Education: N/A     Occupational History   ??? Not on file.     Social History Main Topics   ??? Smoking status: Not on file   ??? Smokeless tobacco: Not on file   ??? Alcohol Use: Not on file   ??? Drug Use: Not on file   ??? Sexual Activity: Not on file     Other Topics Concern   ??? Not on file     Social History Narrative   ??? No narrative on file                  ALLERGIES: Pcn      Review of Systems   Cardiovascular: Negative for chest pain.   Gastrointestinal: Negative for abdominal pain.   Musculoskeletal: Positive for arthralgias (right shoulder).        Negative for leg pain   All other systems reviewed and are negative.      Filed Vitals:    01/13/14 1142   BP: 145/94   Pulse: 102   Temp:  97.8 ??F (36.6 ??C)   Resp: 16   Height: 6' (1.829 m)   Weight: 90.719 kg (200 lb)   SpO2: 98%            Physical Exam   Constitutional: He is oriented to person, place, and time. He appears well-developed and well-nourished.   HENT:   Head: Normocephalic and atraumatic.   Mouth/Throat: Oropharynx is clear and moist. No oropharyngeal exudate.   PERRLA.     Eyes: Conjunctivae and EOM are normal. Pupils are equal, round, and reactive to light. Right eye exhibits no discharge. Left eye exhibits no discharge. No scleral icterus.   Right subconjuctival hemmorrhage.  PERRLA.  Some right periorbital edema.  Visual fields intact.  Neck:   Tender c-spine.  In c-collar.   Cardiovascular: Normal rate, regular rhythm and normal heart sounds.  Exam reveals no gallop and no friction rub.    No murmur heard.  Pulmonary/Chest: Effort normal and breath sounds normal. No respiratory distress. He has no wheezes. He has no rales.   Abdominal: Soft. Bowel sounds are normal. He exhibits no distension. There is no tenderness. There is no guarding.   Musculoskeletal: He exhibits no edema.   Tender right shoulder with limited ROM and deformity.  Distal PMS of right arm intact.      Tender lower T and L-spine.  Tender c-spine   Lymphadenopathy:     He has no cervical adenopathy.   Neurological: He is alert and oriented to person, place, and time. No cranial nerve deficit. He exhibits normal muscle tone. Coordination normal.   Skin: Skin is warm and dry. No rash noted. No pallor.   Nursing note and vitals reviewed.       MDM     28 year old male status post fall from second story roof after feeling lightheaded. He has a deformed and tender right shoulder. He also has a tender thoracic, cervical and lumbar spine. Nontender chest and abdomen pelvis. He has been hematuria since the incident. Vitals are stable at this time.  He is a prior history of MI and is currently taking Plavix. Differential diagnosis: Shoulder fracture or dislocation,  spinal fracture or strain or contusion, intracerebral hemorrhage, orbital fracture, much less likely ruptured globe given pupils equal and reactive and has gross good visual acuity, and others. Will check CT head, CT orbits, CT C-spine, CT chest abdomen and pelvis. Chest x-ray. Lab work. Pain medication and IV fluids.    Procedures    ED EKG interpretation:  Rhythm: normal sinus rhythm; and regular . Rate (approx.): 93; Axis: normal; ST/T wave: normal.   Note written by Tylene FantasiaMegan M. O'Rear, Scribe, as dictated by Vella Kohlerandall M Blessed Girdner, MD.    CONSULT NOTE:  12:12 PM  Discussed with radiology and will evaluate thoracic and lumbar spine using reconstructions from chest/abd CT's; will do dedicated CT's if suspicious.   Note written by Tylene FantasiaMegan M. O'Rear, Scribe, as dictated by Vella Kohlerandall M Ashton Belote, MD.    PROGRESS NOTE:  1:51 PM  Pt has been reevaluated and still has severe right shoulder pain despite morphine dose; will give 0.5 mg dilaudid.   Written by Melvia HeapsMegan O'Rear, ED Scribe, as dictated by Vella Kohlerandall M Railey Glad, MD.    PROGRESS NOTE:  2:31 PM  Radiology tech reports to MD that he thinks pt's right shoulder popped back into socket during CT.  Will confirm once radiologist has read images.   Written by Melvia HeapsMegan O'Rear, ED Scribe, as dictated by Vella Kohlerandall M Tericka Devincenzi, MD.    3:14 PM  Repeat view of shoulder without dislocation.  Sling.  F/u orthopedics and pcp.      3:14 PM  Patient's results have been reviewed with them.  Patient and/or family have verbally conveyed their understanding and agreement of the patient's signs, symptoms, diagnosis, treatment and prognosis and additionally agree to follow up as recommended or return to the Emergency Room should their condition change prior to follow-up.  Discharge instructions have also been provided to the patient with some educational information regarding their diagnosis as well a list of reasons why they would want to return to the ER prior to their follow-up appointment should their  condition change.

## 2014-01-13 NOTE — ED Notes (Signed)
Patient ambulatory to bathroom, steady gait noted

## 2014-01-13 NOTE — ED Notes (Addendum)
Pt fell 2 stories from a room and landed on his back and right shoulder , c/o neck and back pain and right shoulder pain, pt stated he dislocated his shoulder, denies loc, pt was dizzy prior to falling, cervical collar placed on pt in triage

## 2014-01-13 NOTE — ED Notes (Signed)
Patient verbalizes understanding of discharge instructions. Ambulatory and in no acute distress at discharge.

## 2014-01-14 LAB — EKG, 12 LEAD, INITIAL
Atrial Rate: 93 {beats}/min
Calculated P Axis: 24 degrees
Calculated R Axis: 57 degrees
Calculated T Axis: 28 degrees
P-R Interval: 142 ms
Q-T Interval: 346 ms
QRS Duration: 104 ms
QTC Calculation (Bezet): 430 ms
Ventricular Rate: 93 {beats}/min

## 2014-04-11 LAB — CBC WITH AUTOMATED DIFF
ABS. BASOPHILS: 0 10*3/uL (ref 0.0–0.06)
ABS. EOSINOPHILS: 0.3 10*3/uL (ref 0.0–0.4)
ABS. LYMPHOCYTES: 2 10*3/uL (ref 0.9–3.6)
ABS. MONOCYTES: 0.6 10*3/uL (ref 0.05–1.2)
ABS. NEUTROPHILS: 5.4 10*3/uL (ref 1.8–8.0)
BASOPHILS: 0 % (ref 0–2)
EOSINOPHILS: 4 % (ref 0–5)
HCT: 39.4 % (ref 36.0–48.0)
HGB: 13.5 g/dL (ref 13.0–16.0)
LYMPHOCYTES: 24 % (ref 21–52)
MCH: 31.2 PG (ref 24.0–34.0)
MCHC: 34.3 g/dL (ref 31.0–37.0)
MCV: 91 FL (ref 74.0–97.0)
MONOCYTES: 7 % (ref 3–10)
MPV: 9.6 FL (ref 9.2–11.8)
NEUTROPHILS: 65 % (ref 40–73)
PLATELET: 215 10*3/uL (ref 135–420)
RBC: 4.33 M/uL — ABNORMAL LOW (ref 4.70–5.50)
RDW: 12.7 % (ref 11.6–14.5)
WBC: 8.4 10*3/uL (ref 4.6–13.2)

## 2014-04-11 LAB — METABOLIC PANEL, COMPREHENSIVE
A-G Ratio: 1.3 (ref 0.8–1.7)
ALT (SGPT): 31 U/L (ref 16–61)
AST (SGOT): 15 U/L (ref 15–37)
Albumin: 4.4 g/dL (ref 3.4–5.0)
Alk. phosphatase: 99 U/L (ref 45–117)
Anion gap: 8 mmol/L (ref 3.0–18)
BUN/Creatinine ratio: 14 (ref 12–20)
BUN: 18 MG/DL (ref 7.0–18)
Bilirubin, total: 0.4 MG/DL (ref 0.2–1.0)
CO2: 27 mmol/L (ref 21–32)
Calcium: 8.7 MG/DL (ref 8.5–10.1)
Chloride: 105 mmol/L (ref 100–108)
Creatinine: 1.29 MG/DL (ref 0.6–1.3)
GFR est AA: >60 mL/min/{1.73_m2} (ref >60)
GFR est non-AA: >60 mL/min/{1.73_m2} (ref >60)
Globulin: 3.3 g/dL (ref 2.0–4.0)
Glucose: 92 mg/dL (ref 74–99)
Potassium: 4 mmol/L (ref 3.5–5.5)
Protein, total: 7.7 g/dL (ref 6.4–8.2)
Sodium: 140 mmol/L (ref 136–145)

## 2014-04-11 LAB — AMYLASE: Amylase: 36 U/L (ref 25–115)

## 2014-04-11 LAB — ETHYL ALCOHOL: ALCOHOL(ETHYL),SERUM: <3 MG/DL (ref 0–3)

## 2014-04-11 LAB — LIPASE: Lipase: 98 U/L (ref 73–393)

## 2014-04-11 MED ORDER — HYDROCODONE-ACETAMINOPHEN 5 MG-325 MG TAB
5-325 mg | ORAL_TABLET | ORAL | Status: DC | PRN
Start: 2014-04-11 — End: 2014-05-06

## 2014-04-11 MED ORDER — ONDANSETRON (PF) 4 MG/2 ML INJECTION
4 mg/2 mL | INTRAMUSCULAR | Status: AC
Start: 2014-04-11 — End: 2014-04-11
  Administered 2014-04-11: 20:00:00 via INTRAVENOUS

## 2014-04-11 MED ORDER — CYCLOBENZAPRINE 5 MG TAB
5 mg | ORAL_TABLET | Freq: Three times a day (TID) | ORAL | Status: AC
Start: 2014-04-11 — End: 2014-04-18

## 2014-04-11 MED ORDER — ONDANSETRON 4 MG TAB, RAPID DISSOLVE
4 mg | ORAL | Status: AC
Start: 2014-04-11 — End: 2014-04-11
  Administered 2014-04-11: 22:00:00 via ORAL

## 2014-04-11 MED ORDER — IOPAMIDOL 61 % IV SOLN
300 mg iodine /mL (61 %) | Freq: Once | INTRAVENOUS | Status: AC
Start: 2014-04-11 — End: 2014-04-11
  Administered 2014-04-11: 21:00:00 via INTRAVENOUS

## 2014-04-11 MED ORDER — MORPHINE 4 MG/ML SYRINGE
4 mg/mL | INTRAMUSCULAR | Status: AC
Start: 2014-04-11 — End: 2014-04-11
  Administered 2014-04-11: 20:00:00 via INTRAVENOUS

## 2014-04-11 MED ORDER — HYDROCODONE-ACETAMINOPHEN 5 MG-325 MG TAB
5-325 mg | Freq: Once | ORAL | Status: AC
Start: 2014-04-11 — End: 2014-04-11
  Administered 2014-04-11: 22:00:00 via ORAL

## 2014-04-11 MED ADMIN — sodium chloride 0.9 % bolus infusion 1,000 mL: INTRAVENOUS | @ 20:00:00 | NDC 00409798309

## 2014-04-11 MED ADMIN — HYDROmorphone (PF) (DILAUDID) 1 mg/mL injection: INTRAVENOUS | @ 21:00:00 | NDC 00409128331

## 2014-04-11 MED FILL — HYDROMORPHONE (PF) 1 MG/ML IJ SOLN: 1 mg/mL | INTRAMUSCULAR | Qty: 1

## 2014-04-11 MED FILL — ONDANSETRON (PF) 4 MG/2 ML INJECTION: 4 mg/2 mL | INTRAMUSCULAR | Qty: 2

## 2014-04-11 MED FILL — ONDANSETRON 4 MG TAB, RAPID DISSOLVE: 4 mg | ORAL | Qty: 1

## 2014-04-11 MED FILL — SODIUM CHLORIDE 0.9 % IV: INTRAVENOUS | Qty: 1000

## 2014-04-11 MED FILL — ISOVUE-300  61 % INTRAVENOUS SOLUTION: 300 mg iodine /mL (61 %) | INTRAVENOUS | Qty: 100

## 2014-04-11 MED FILL — MORPHINE 4 MG/ML SYRINGE: 4 mg/mL | INTRAMUSCULAR | Qty: 1

## 2014-04-11 MED FILL — HYDROCODONE-ACETAMINOPHEN 5 MG-325 MG TAB: 5-325 mg | ORAL | Qty: 2

## 2014-04-11 NOTE — ED Notes (Signed)
Pt stable. Bedside report given to Amanda, RN who assumes care of pt.

## 2014-04-11 NOTE — ED Notes (Signed)
I have reviewed discharge instructions with the patient.  The patient verbalized understanding.  Patient armband removed and shredded, scripts x 2 given
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
## 2014-04-11 NOTE — ED Notes (Signed)
I performed a brief evaluation, including history and physical, of the patient here in triage and I have determined that pt will need further treatment and evaluation from the main side ER physician.  I have placed initial orders to help in expediting patients care.     April 11, 2014 at 2:59 PM - Ketrina Boateng Sheran Luz, MD        Visit Vitals   Item Reading   ??? BP 142/98 mmHg   ??? Pulse 116   ??? Temp 98.1 ??F (36.7 ??C)   ??? Resp 20   ??? Ht 6' (1.829 m)   ??? Wt 77.111 kg (170 lb)   ??? BMI 23.05 kg/m2   ??? SpO2 100%
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
## 2014-05-02 ENCOUNTER — Emergency Department: Admit: 2014-05-02 | Payer: MEDICARE | Primary: Ophthalmology

## 2014-05-02 ENCOUNTER — Inpatient Hospital Stay: Admit: 2014-05-02 | Discharge: 2014-05-03 | Payer: MEDICARE | Attending: Internal Medicine

## 2014-05-02 DIAGNOSIS — M25512 Pain in left shoulder: Secondary | ICD-10-CM

## 2014-05-02 MED ORDER — MORPHINE 4 MG/ML SYRINGE
4 mg/mL | INTRAMUSCULAR | Status: AC
Start: 2014-05-02 — End: 2014-05-02
  Administered 2014-05-02: 23:00:00 via INTRAVENOUS

## 2014-05-02 MED FILL — MORPHINE 4 MG/ML SYRINGE: 4 mg/mL | INTRAMUSCULAR | Qty: 1

## 2014-05-02 NOTE — ED Notes (Signed)
Pt asked by this nurse, what was his current pain level pt states " I need some 2 X 2 and [place]"  Pt the observed, stinking his right arm out , pt asked if he would like his IV removed pt shook his head yes. Pt Iv removed at this time. Pt then left ED. Dr Lucianne MussKumar made aware that pt eloped

## 2014-05-02 NOTE — ED Notes (Signed)
Pt at doorway of room, this nurse made pt aware that awating wet read results. Pt then states " What, i don't know what they doing all I know is that if I keep standing here in pain with tears my eyes its going to be a problem"  Dr Lucianne MussKumar made aware of pts complaints of pain and states he is awaiting wet read results. No new orders received at this this time.

## 2014-05-02 NOTE — ED Notes (Signed)
Dr Lucianne MussKumar made aware of the statements made by pt. No new orders received at this time

## 2014-05-02 NOTE — ED Notes (Signed)
Dr Lucianne MussKumar at bedside, speaking with pt updating pt on plan of care. Making pt aware that he may not need to have any his left shoulder put back in place and that he is awaiting wet read

## 2014-05-02 NOTE — ED Notes (Signed)
Pt has also removed sling that was on left shoulder

## 2014-05-02 NOTE — ED Notes (Signed)
Dr Lucianne MussKumar made aware, verbal orders provided for Morphine 2 mg. Pt offered Morphine by Sherene SiresBetty Anne Rn. Pt states " So it takes me speaking up and getting loud to get some help around here" Pt states " I'm leaving"  Pt asked by Sherene SiresBetty Anne RN " Of sir, are you going to leave if so..." Pt then states " I'm not signing out AMA" Charge nurse Sherene SiresBetty Anne Rn then stated " I was not going to ask you to leave AMA, I just need to take the Iv out if so" Pt states " Naw, ain't nobody touching me"  Pt made aware Morphine ordered, states he will stay an receive medication

## 2014-05-02 NOTE — ED Notes (Signed)
Bedside report received from [facility] RN assuming care of patient. No acute distress noted at this time. Vital signs are stable. Patient at x-ray at time of shift change

## 2014-05-02 NOTE — ED Notes (Signed)
Evan SiresBetty Anne Rn spoke wit Dr Lucianne MussKumar and per Dr Lucianne MussKumar to can have percocet. Pt refused

## 2014-05-02 NOTE — ED Notes (Signed)
Pt requested to speak with charge nurse Sherene SiresBetty Anne RN, Charge nurse made aware and is at bedside speaking with pt

## 2014-05-02 NOTE — ED Notes (Signed)
Pt provided pain medication, at this time

## 2014-05-02 NOTE — ED Notes (Signed)
Pt fell in [employer] parking lot. Pt with c/o possible L shoulder dislocation. Pt to ED via EMS. Sling to L arm. Pt with additional c/o L hip pain, but ambulatory on scene. PIV 20g R AC placed by medics. No s.s of distress noted. Will continue to monitor.

## 2014-05-02 NOTE — ED Notes (Signed)
Pt in room tearful pt made aware that he may possibly have conscious sedation, pt states " They not giving me profalol or versed or any of that" Pt asked how he would like to go about possible placing his left shoulder back in place pt states " they probably not because i'm about to get up and walk out of here"  Pt asked if he was in pain pt refused to answer. This nurse then stated " i cant help you if you wont talk to me, and Im trying to help" Pt then states " Don't help" Will make provider aware
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
## 2014-05-02 NOTE — ED Notes (Signed)
Pt observed ambulating to bathroom

## 2014-05-02 NOTE — ED Notes (Signed)
Pt observed standing up on room on telephone

## 2014-05-03 MED ORDER — MORPHINE 2 MG/ML INJECTION
2 mg/mL | INTRAMUSCULAR | Status: AC
Start: 2014-05-03 — End: 2014-05-02
  Administered 2014-05-03: 01:00:00 via INTRAVENOUS

## 2014-05-03 MED FILL — MORPHINE 2 MG/ML INJECTION: 2 mg/mL | INTRAMUSCULAR | Qty: 1

## 2014-05-06 ENCOUNTER — Emergency Department: Admit: 2014-05-07 | Payer: MEDICARE | Primary: Ophthalmology

## 2014-05-06 ENCOUNTER — Emergency Department: Payer: MEDICARE | Primary: Ophthalmology

## 2014-05-06 DIAGNOSIS — R079 Chest pain, unspecified: Secondary | ICD-10-CM
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
## 2014-05-06 NOTE — ED Provider Notes (Signed)
HPI Comments: Evan Woods is a  28 y.o. Normal build white male smoker h/o MI, CAD, HTN, Recurrent Shoulder Dislocation (RT) presents to the ED via EMS c/o sudden onset of chest pain x 1 hour ago. Accompanied with SOB, dizziness and diaphoresis. He states he fell from the dizziness and thinks he dislocated his rt shoulder. He had a prior MI but did not elaborate on the history other than [facility]. He denies LOC, head trauma, abdominal pain, back pain, headache. He also mentioned that around 2pm today he had an episode of chest pain which he took his NTG SL with resolve.               The history is provided by the patient.        Past Medical History   Diagnosis Date   ??? Acute MI (HCC)    ??? Hypertension         Past Surgical History   Procedure Laterality Date   ??? Hx orthopaedic       shoulder surgery   ??? Hx appendectomy           History reviewed. No pertinent family history.     History     Social History   ??? Marital Status: DIVORCED     Spouse Name: N/A     Number of Children: N/A   ??? Years of Education: N/A     Occupational History   ??? Not on file.     Social History Main Topics   ??? Smoking status: Current Every Day Smoker   ??? Smokeless tobacco: Not on file   ??? Alcohol Use: Not on file   ??? Drug Use: No   ??? Sexual Activity: Not on file     Other Topics Concern   ??? Not on file     Social History Narrative                  ALLERGIES: Pcn      Review of Systems   Constitutional: Negative for fever and chills.   HENT: Negative for ear pain and sore throat.    Eyes: Negative.  Negative for pain and discharge.   Respiratory: Positive for chest tightness. Negative for cough and shortness of breath.    Cardiovascular: Positive for chest pain. Negative for palpitations and leg swelling.   Gastrointestinal: Negative for nausea, vomiting, abdominal pain and diarrhea.   Genitourinary: Negative for dysuria, urgency and frequency.   Musculoskeletal: Positive for joint swelling and arthralgias.    Skin: Negative for color change, pallor, rash and wound.   Neurological: Negative for dizziness and headaches.   Psychiatric/Behavioral: Negative for behavioral problems.       Filed Vitals:    05/06/14 2230 05/06/14 2245 05/06/14 2300 05/06/14 2315   BP: 128/81 121/76 126/76 134/90   Pulse: 97 89 88    Temp:       Resp: 19 17 14     Height:       Weight:       SpO2: 100% 99% 100%             Physical Exam   Constitutional: He is oriented to person, place, and time. He appears well-developed and well-nourished. No distress.   Cardiovascular: Normal rate, regular rhythm, normal heart sounds and intact distal pulses.  Exam reveals no gallop and no friction rub.    No murmur heard.  Pulmonary/Chest: Effort normal and breath sounds normal. No  respiratory distress. He has no wheezes. He has no rales. He exhibits no tenderness.   Abdominal: Soft. Bowel sounds are normal. There is no tenderness. There is no rebound and no guarding.   Musculoskeletal:        Right shoulder: He exhibits decreased range of motion, tenderness, bony tenderness, swelling, deformity, pain and spasm. He exhibits no effusion, no crepitus, no laceration, normal pulse and normal strength.        Cervical back: Normal.        Right lower leg: Normal. He exhibits no tenderness, no swelling and no edema.        Left lower leg: Normal. He exhibits no tenderness, no swelling and no edema.   MS: 5/5 BUE. NVI. Radial Pulses intact BUE.     RUE Shoulder: Obvious deformity + LROM.    Neurological: He is alert and oriented to person, place, and time. He has normal strength. No sensory deficit.   Skin: Skin is warm and dry. He is not diaphoretic.   Nursing note and vitals reviewed.       MDM  Number of Diagnoses or Management Options  Chest pain, unspecified chest pain type: new and requires workup  Left against medical advice: new and requires workup  Shoulder dislocation, right, initial encounter: new and requires workup   Diagnosis management comments: DDX: Chest pain, Atypical CP, Musculoskeletal CP, Chest wall contusion, Pleuritis, Pleurisy, ACS, Pneumothorax, Pneumonia, Bronchitis, Pleurodynia, Pericarditis, Pleural Effusion, Atelectasis, Pericardial Effusion, CHF, Gastro-esophageal spasm, Esophagitis, Gastritis, Rib fracture, Costochondritis.     ED EKG interpretation:  Rhythm: normal sinus rhythm;  Vent rate: 91 bpm; PR Interval: 152 ms; QRS duration: 106 ms; QT/QTc: 330/405 ms; P-R-T axes: 50 62 52. Other findings: normal. EKG was completed at 2126. This EKG was interpreted by Dr. Liana Gerold (ED attending) at 2126.    PERC Score:     Wells Criteria for PE:     Patient received ASA 324 mg PO in route as well as Nitro SL x 1 with complete resolution of his CP. He also reports he took a Nitro SL around 2 pm for CP, went away at that time.     11:43 PM: Informed by Alisa Graff; that the pt eloped after the shoulder spontaneously reduced after weights were applied.     11:53 PM  He refuses Post-Reduction films. He would like to leave AMA. Has FROM of the right shoulder.     Pt expresses wish to leave the emergency department against medical advice (AMA).  Adverse problems related to this decision including bodily harm and death have been discussed with the patient and/or family. The proposed treatment was reviewed and the patient stated understanding. Alternative treatment was discussed and the patient stated understanding. The patient and/or family do not appear intoxicated and appear to be capable of understanding and making a decision of such gravity.  The physician has discussed the benefits reasonably expected from offered services are:  further observation, evaluation and treatment of current symptoms.  The risks of refusing treatment; including the potential of death and/or permanent disability, was discussed and the patient stated understanding. The patient and/or family express understanding of the possible adverse  outcomes of their decision and still express the wish to leave against medical advice.  The patient and/or family were given follow up and return instructions and the available laboratory tests and x-rays were discussed.  The patient and/or family were given the opportunity to ask questions.  The patient and/or family  agree to follow up with a physician of their choice as soon as possible or to return to the ER at any time to finish the work-up.      Reviewed workup results, any meds, and discharge instructions OR admission plan with patient and any family present.  Answered all questions. Michaelyn Barter, PA  11:53 PM    PLEASE FOLLOW-UP AS DIRECTED WITHOUT FAIL WITHIN THE TIME FRAME RECOMMENDED AS FAILURE TO DO SO COULD RESULT IN WORSENING OF YOUR PHYSICAL CONDITION, DEATH, AND OR PERMANENT DISABILITY.     RETURN TO THE EMERGENCY DEPARTMENT AT IF YOU ARE UNABLE TO FOLLOW-UP AS DIRECTED.     RETURN TO THE EMERGENCY DEPARTMENT AT ONCE IF YOU HAVE SYMPTOMS THAT DO NOT IMPROVE WITH TREATMENT, NEW SYMPTOMS, WORSENING SYMPTOMS, OR ANY OTHER CONCERNS.     THE PATIENT AGREES WITH THE DISCHARGE PLAN AND FOLLOW-UP INSTRUCTIONS. THE PATIENT AGREES TO REVIEW ALL HANDOUTS.          Amount and/or Complexity of Data Reviewed  Clinical lab tests: reviewed and ordered  Tests in the radiology section of CPT??: ordered and reviewed  Tests in the medicine section of CPT??: reviewed and ordered  Discussion of test results with the performing providers: yes  Decide to obtain previous medical records or to obtain history from someone other than the patient: yes  Review and summarize past medical records: yes  Independent visualization of images, tracings, or specimens: yes    Risk of Complications, Morbidity, and/or Mortality  Presenting problems: moderate  Diagnostic procedures: moderate  Management options: moderate    Patient Progress  Patient progress: stable      Splint, Other  Date/Time: 05/06/2014 11:54 PM   Performed by: PA (tech)Supervising provider: Noreene Filbert  Pre-procedure re-eval: Immediately prior to the procedure, the patient was reevaluated and found suitable for the planned procedure and any planned medications.  Time out: Immediately prior to the procedure a time out was called to verify the correct patient, procedure, equipment, staff and marking as appropriate..  Location: R shoulder.  Splint type: Shoulder Immobilizer.  Approach: anterior and posterior  Supplies used: Shoulder Immobilizer.  Post-procedure: The splinted body part was neurovascularly unchanged following the procedure.  Patient tolerance: Patient tolerated the procedure well with no immediate complications  My total time at bedside, performing this procedure was 1-15 minutes.        Diagnosis:   1. Chest pain, unspecified chest pain type    2. Shoulder dislocation, right, initial encounter    3. Left against medical advice          Disposition: AMA    Follow-up Information     Follow up With Details Comments Contact Info    [facility] EMERGENCY DEPT  As needed, If symptoms worsen [address] [facility] [address]  [phone]    [facility] EMERGENCY DEPT  AS SOON AS POSSIBLE FOR FURTHER EVALUATION WITHOUT FAIL.  [address]  [place] [address]  [phone]    [facility] [address] Call in 1 day Orthopedics: Schedule appointment to be seen same day without fail.  [address]  [place] [address]  [phone]    Arletha Pili, MD Call in 1 day Cardiology; Schedule appointment to be seen within 1 day. OR follow-up with your Cardiologist within 1 day.  [address]  [address]  [facility]  [place] [address]  [phone]  Current Discharge Medication List      CONTINUE these medications which have NOT CHANGED    Details   atorvastatin (LIPITOR) 10 mg tablet Take  by mouth daily.      metoprolol (LOPRESSOR) 25 mg tablet Take  by mouth two (2) times a day.       clopidogrel (PLAVIX) 75 mg tablet Take  by mouth daily.      aspirin (ASPIRIN) 325 mg tablet Take 325 mg by mouth daily.             Recent Results (from the past 24 hour(s))   EKG, 12 LEAD, INITIAL    Collection Time: 05/06/14  9:26 PM   Result Value Ref Range    Ventricular Rate 91 BPM    Atrial Rate 91 BPM    P-R Interval 152 ms    QRS Duration 106 ms    Q-T Interval 330 ms    QTC Calculation (Bezet) 405 ms    Calculated P Axis 50 degrees    Calculated R Axis 62 degrees    Calculated T Axis 52 degrees    Diagnosis       Normal sinus rhythm  Normal ECG  No previous ECGs available     CBC WITH AUTOMATED DIFF    Collection Time: 05/06/14  9:30 PM   Result Value Ref Range    WBC 5.6 4.6 - 13.2 K/uL    RBC 3.63 (L) 4.70 - 5.50 M/uL    HGB 11.5 (L) 13.0 - 16.0 g/dL    HCT 16.1 (L) 09.6 - 48.0 %    MCV 92.0 74.0 - 97.0 FL    MCH 31.7 24.0 - 34.0 PG    MCHC 34.4 31.0 - 37.0 g/dL    RDW 04.5 40.9 - 81.1 %    PLATELET 192 135 - 420 K/uL    MPV 9.4 9.2 - 11.8 FL    NEUTROPHILS 60 40 - 73 %    LYMPHOCYTES 27 21 - 52 %    MONOCYTES 9 3 - 10 %    EOSINOPHILS 4 0 - 5 %    BASOPHILS 0 0 - 2 %    ABS. NEUTROPHILS 3.3 1.8 - 8.0 K/UL    ABS. LYMPHOCYTES 1.5 0.9 - 3.6 K/UL    ABS. MONOCYTES 0.5 0.05 - 1.2 K/UL    ABS. EOSINOPHILS 0.2 0.0 - 0.4 K/UL    ABS. BASOPHILS 0.0 0.0 - 0.06 K/UL    DF AUTOMATED     METABOLIC PANEL, BASIC    Collection Time: 05/06/14  9:30 PM   Result Value Ref Range    Sodium 140 136 - 145 mmol/L    Potassium 3.9 3.5 - 5.5 mmol/L    Chloride 110 (H) 100 - 108 mmol/L    CO2 26 21 - 32 mmol/L    Anion gap 4 3.0 - 18 mmol/L    Glucose 115 (H) 74 - 99 mg/dL    BUN 14 7.0 - 18 MG/DL    Creatinine 9.14 0.6 - 1.3 MG/DL    BUN/Creatinine ratio 13 12 - 20      GFR est AA >60 >60 ml/min/1.70m2    GFR est non-AA >60 >60 ml/min/1.14m2    Calcium 8.3 (L) 8.5 - 10.1 MG/DL   CARDIAC PANEL,(CK, CKMB & TROPONIN)    Collection Time: 05/06/14  9:30 PM   Result Value Ref Range    CK 410 (H) 39 - 308 U/L     CK -  MB 2.4 0.5 - 3.6 ng/ml    CK-MB Index 0.6 0.0 - 4.0 %    Troponin-I, Qt. <0.02 0.0 - 0.045 NG/ML

## 2014-05-06 NOTE — ED Notes (Signed)
Weights placed on patient to assist in relocating shoulder.  Pt states it is better and back in. Immobilizer placed and pt dressed.

## 2014-05-06 NOTE — ED Notes (Signed)
Pt upset and states he is leaving, refusing xray.

## 2014-05-07 ENCOUNTER — Inpatient Hospital Stay
Admit: 2014-05-07 | Discharge: 2014-05-07 | Discharge status: Against Medical Advice | Payer: MEDICARE | Attending: Emergency Medicine

## 2014-05-07 LAB — METABOLIC PANEL, BASIC
Anion gap: 4 mmol/L (ref 3.0–18)
BUN/Creatinine ratio: 13 (ref 12–20)
BUN: 14 MG/DL (ref 7.0–18)
CO2: 26 mmol/L (ref 21–32)
Calcium: 8.3 MG/DL — ABNORMAL LOW (ref 8.5–10.1)
Chloride: 110 mmol/L — ABNORMAL HIGH (ref 100–108)
Creatinine: 1.12 MG/DL (ref 0.6–1.3)
GFR est AA: >60 mL/min/{1.73_m2} (ref >60)
GFR est non-AA: >60 mL/min/{1.73_m2} (ref >60)
Glucose: 115 mg/dL — ABNORMAL HIGH (ref 74–99)
Potassium: 3.9 mmol/L (ref 3.5–5.5)
Sodium: 140 mmol/L (ref 136–145)

## 2014-05-07 LAB — EKG, 12 LEAD, INITIAL
Atrial Rate: 91 {beats}/min
Calculated P Axis: 50 degrees
Calculated R Axis: 62 degrees
Calculated T Axis: 52 degrees
Diagnosis: NORMAL
P-R Interval: 152 ms
Q-T Interval: 330 ms
QRS Duration: 106 ms
QTC Calculation (Bezet): 405 ms
Ventricular Rate: 91 {beats}/min

## 2014-05-07 LAB — CBC WITH AUTOMATED DIFF
ABS. BASOPHILS: 0 10*3/uL (ref 0.0–0.06)
ABS. EOSINOPHILS: 0.2 10*3/uL (ref 0.0–0.4)
ABS. LYMPHOCYTES: 1.5 10*3/uL (ref 0.9–3.6)
ABS. MONOCYTES: 0.5 10*3/uL (ref 0.05–1.2)
ABS. NEUTROPHILS: 3.3 10*3/uL (ref 1.8–8.0)
BASOPHILS: 0 % (ref 0–2)
EOSINOPHILS: 4 % (ref 0–5)
HCT: 33.4 % — ABNORMAL LOW (ref 36.0–48.0)
HGB: 11.5 g/dL — ABNORMAL LOW (ref 13.0–16.0)
LYMPHOCYTES: 27 % (ref 21–52)
MCH: 31.7 PG (ref 24.0–34.0)
MCHC: 34.4 g/dL (ref 31.0–37.0)
MCV: 92 FL (ref 74.0–97.0)
MONOCYTES: 9 % (ref 3–10)
MPV: 9.4 FL (ref 9.2–11.8)
NEUTROPHILS: 60 % (ref 40–73)
PLATELET: 192 10*3/uL (ref 135–420)
RBC: 3.63 M/uL — ABNORMAL LOW (ref 4.70–5.50)
RDW: 13.8 % (ref 11.6–14.5)
WBC: 5.6 10*3/uL (ref 4.6–13.2)

## 2014-05-07 LAB — CARDIAC PANEL,(CK, CKMB & TROPONIN)
CK - MB: 2.4 ng/ml (ref 0.5–3.6)
CK-MB Index: 0.6 % (ref 0.0–4.0)
CK: 410 U/L — ABNORMAL HIGH (ref 39–308)
Troponin-I, QT: <0.02 NG/ML (ref 0.0–0.045)

## 2014-05-07 MED ORDER — DIAZEPAM 5 MG/ML SYRINGE
5 mg/mL | INTRAMUSCULAR | Status: AC
Start: 2014-05-07 — End: 2014-05-06
  Administered 2014-05-07: 03:00:00 via INTRAVENOUS

## 2014-05-07 MED ORDER — DIAZEPAM 5 MG/ML SYRINGE
5 mg/mL | INTRAMUSCULAR | Status: AC
Start: 2014-05-07 — End: 2014-05-06
  Administered 2014-05-07: 02:00:00 via INTRAVENOUS

## 2014-05-07 MED ORDER — HYDROMORPHONE (PF) 1 MG/ML IJ SOLN
1 mg/mL | INTRAMUSCULAR | Status: DC
Start: 2014-05-07 — End: 2014-05-07

## 2014-05-07 MED ORDER — MORPHINE 4 MG/ML SYRINGE
4 mg/mL | INTRAMUSCULAR | Status: AC
Start: 2014-05-07 — End: 2014-05-06
  Administered 2014-05-07: 02:00:00 via INTRAVENOUS

## 2014-05-07 MED ORDER — ASPIRIN 325 MG TAB
325 mg | ORAL | Status: DC
Start: 2014-05-07 — End: 2014-05-06

## 2014-05-07 MED FILL — MORPHINE 4 MG/ML SYRINGE: 4 mg/mL | INTRAMUSCULAR | Qty: 1

## 2014-05-07 MED FILL — DIAZEPAM 5 MG/ML SYRINGE: 5 mg/mL | INTRAMUSCULAR | Qty: 2

## 2014-05-07 NOTE — ED Notes (Signed)
I have reviewed discharge instructions with the patient.  The patient verbalized understanding.Patient armband removed and shredded

## 2014-05-12 ENCOUNTER — Inpatient Hospital Stay
Admit: 2014-05-12 | Discharge: 2014-05-12 | Disposition: A | Discharge status: Home / Self Care | Payer: MEDICARE | Attending: Emergency Medicine

## 2014-05-12 ENCOUNTER — Emergency Department: Admit: 2014-05-12 | Payer: MEDICARE | Primary: Ophthalmology

## 2014-05-12 DIAGNOSIS — R0789 Other chest pain: Secondary | ICD-10-CM

## 2014-05-12 LAB — METABOLIC PANEL, COMPREHENSIVE
A-G Ratio: 1.2 (ref 0.8–1.7)
ALT (SGPT): 27 U/L (ref 16–61)
AST (SGOT): 12 U/L — ABNORMAL LOW (ref 15–37)
Albumin: 3.8 g/dL (ref 3.4–5.0)
Alk. phosphatase: 76 U/L (ref 45–117)
Anion gap: 6 mmol/L (ref 3.0–18)
BUN/Creatinine ratio: 11 — ABNORMAL LOW (ref 12–20)
BUN: 10 MG/DL (ref 7.0–18)
Bilirubin, total: 0.3 MG/DL (ref 0.2–1.0)
CO2: 27 mmol/L (ref 21–32)
Calcium: 8.2 MG/DL — ABNORMAL LOW (ref 8.5–10.1)
Chloride: 109 mmol/L — ABNORMAL HIGH (ref 100–108)
Creatinine: 0.95 MG/DL (ref 0.6–1.3)
GFR est AA: >60 mL/min/{1.73_m2} (ref >60)
GFR est non-AA: >60 mL/min/{1.73_m2} (ref >60)
Globulin: 3.1 g/dL (ref 2.0–4.0)
Glucose: 105 mg/dL — ABNORMAL HIGH (ref 74–99)
Potassium: 4 mmol/L (ref 3.5–5.5)
Protein, total: 6.9 g/dL (ref 6.4–8.2)
Sodium: 142 mmol/L (ref 136–145)

## 2014-05-12 LAB — CBC WITH AUTOMATED DIFF
ABS. BASOPHILS: 0 10*3/uL (ref 0.0–0.06)
ABS. EOSINOPHILS: 0.1 10*3/uL (ref 0.0–0.4)
ABS. LYMPHOCYTES: 1.4 10*3/uL (ref 0.9–3.6)
ABS. MONOCYTES: 0.3 10*3/uL (ref 0.05–1.2)
ABS. NEUTROPHILS: 2.5 10*3/uL (ref 1.8–8.0)
BASOPHILS: 1 % (ref 0–2)
EOSINOPHILS: 3 % (ref 0–5)
HCT: 35 % — ABNORMAL LOW (ref 36.0–48.0)
HGB: 11.8 g/dL — ABNORMAL LOW (ref 13.0–16.0)
LYMPHOCYTES: 32 % (ref 21–52)
MCH: 31.5 PG (ref 24.0–34.0)
MCHC: 33.7 g/dL (ref 31.0–37.0)
MCV: 93.3 FL (ref 74.0–97.0)
MONOCYTES: 6 % (ref 3–10)
MPV: 9.2 FL (ref 9.2–11.8)
NEUTROPHILS: 58 % (ref 40–73)
PLATELET: 218 10*3/uL (ref 135–420)
RBC: 3.75 M/uL — ABNORMAL LOW (ref 4.70–5.50)
RDW: 13.8 % (ref 11.6–14.5)
WBC: 4.3 10*3/uL — ABNORMAL LOW (ref 4.6–13.2)

## 2014-05-12 LAB — EKG, 12 LEAD, INITIAL
Atrial Rate: 87 {beats}/min
Calculated P Axis: 32 degrees
Calculated R Axis: 58 degrees
Calculated T Axis: 43 degrees
Diagnosis: NORMAL
P-R Interval: 152 ms
Q-T Interval: 338 ms
QRS Duration: 106 ms
QTC Calculation (Bezet): 406 ms
Ventricular Rate: 87 {beats}/min

## 2014-05-12 LAB — TROPONIN I
Troponin-I, QT: <0.02 NG/ML (ref 0.0–0.045)
Troponin-I, QT: <0.02 NG/ML (ref 0.0–0.045)

## 2014-05-12 MED ORDER — ONDANSETRON (PF) 4 MG/2 ML INJECTION
4 mg/2 mL | INTRAMUSCULAR | Status: AC
Start: 2014-05-12 — End: 2014-05-12
  Administered 2014-05-12: 20:00:00 via INTRAVENOUS

## 2014-05-12 MED ORDER — MORPHINE 4 MG/ML SYRINGE
4 mg/mL | INTRAMUSCULAR | Status: AC
Start: 2014-05-12 — End: 2014-05-12
  Administered 2014-05-12: 21:00:00 via INTRAVENOUS

## 2014-05-12 MED ORDER — MORPHINE 4 MG/ML SYRINGE
4 mg/mL | INTRAMUSCULAR | Status: AC
Start: 2014-05-12 — End: 2014-05-12
  Administered 2014-05-12: 20:00:00 via INTRAVENOUS

## 2014-05-12 MED ORDER — NITROGLYCERIN 0.4 MG SUBLINGUAL TAB
0.4 mg | SUBLINGUAL | Status: DC | PRN
Start: 2014-05-12 — End: 2014-05-12

## 2014-05-12 MED FILL — ONDANSETRON (PF) 4 MG/2 ML INJECTION: 4 mg/2 mL | INTRAMUSCULAR | Qty: 2

## 2014-05-12 MED FILL — NITROSTAT 0.4 MG SUBLINGUAL TABLET: 0.4 mg | SUBLINGUAL | Qty: 1

## 2014-05-12 MED FILL — MORPHINE 4 MG/ML SYRINGE: 4 mg/mL | INTRAMUSCULAR | Qty: 1

## 2014-05-12 NOTE — ED Provider Notes (Signed)
HPI Comments: Evan Woods is a 28 y.o. Male with a Hx of CAD and MI who is presenting to the ED for evaluation of chest pain and pressure onset 1.5 hours ago after eating. Reports associated pain radiation to his left arm, left jaw and back, SOB, diaphoresis and nausea. States he took 3 NTG and 324 ASA. Denies cough, congestion, pleuritic components, vomiting, diarrhea, fever, abdominal pain, recent immobilization or pedal edema. Pt reports a Hx of frequent CP, but states that it has not been this severe in 6 months.     Patient is a 28 y.o. male presenting with chest pain. The history is provided by the patient and medical records.   Chest Pain (Angina)   Associated symptoms include diaphoresis, nausea and shortness of breath. Pertinent negatives include no abdominal pain, no back pain, no cough, no dizziness, no fever, no headaches, no palpitations and no vomiting.        Past Medical History   Diagnosis Date   ??? Acute MI ([place])    ??? Hypertension         Past Surgical History   Procedure Laterality Date   ??? Hx orthopaedic       shoulder surgery   ??? Hx appendectomy           No family history on file.     History     Social History   ??? Marital Status: DIVORCED     Spouse Name: N/A     Number of Children: N/A   ??? Years of Education: N/A     Occupational History   ??? Not on file.     Social History Main Topics   ??? Smoking status: Current Every Day Smoker   ??? Smokeless tobacco: Not on file   ??? Alcohol Use: Not on file   ??? Drug Use: No   ??? Sexual Activity: Not on file     Other Topics Concern   ??? Not on file     Social History Narrative                  ALLERGIES: Pcn      Review of Systems   Constitutional: Positive for diaphoresis. Negative for fever.   HENT: Negative for congestion.    Respiratory: Positive for shortness of breath. Negative for cough, chest tightness and wheezing.    Cardiovascular: Positive for chest pain. Negative for palpitations and leg swelling.    Gastrointestinal: Positive for nausea. Negative for vomiting, abdominal pain and diarrhea.   Musculoskeletal: Negative for myalgias and back pain.   Skin: Negative for color change.   Neurological: Negative for dizziness, light-headedness and headaches.   All other systems reviewed and are negative.      Filed Vitals:    05/12/14 1600 05/12/14 1630 05/12/14 1730   BP: 145/80 131/72 134/83   Pulse: 81 82 75   Resp: '16 16 9   ' SpO2: 99% 99% 98%            Physical Exam   Constitutional: He is oriented to person, place, and time. He appears well-developed and well-nourished. He is cooperative. No distress.   HENT:   Head: Normocephalic and atraumatic.   Mouth/Throat: Mucous membranes are normal.   Eyes: Conjunctivae and lids are normal.   Neck: Neck supple. No JVD present.   Cardiovascular: Normal rate, regular rhythm and normal heart sounds.    No murmur heard.  Pulmonary/Chest: Effort normal and breath sounds normal. He  has no wheezes. He has no rhonchi. He has no rales.   Abdominal: Normal appearance.   Musculoskeletal: He exhibits no edema.   Neurological: He is alert and oriented to person, place, and time. No cranial nerve deficit. Gait normal.   Skin: Skin is warm and dry. He is not diaphoretic.   Psychiatric: He has a normal mood and affect.   Nursing note and vitals reviewed.       MDM  Number of Diagnoses or Management Options  Acute chest pain: new and requires workup  Left against medical advice: new and requires workup  Diagnosis management comments: Delta trop neg (patient left AMA before 2nd set) and EKG nonischemic. Refused NTG, pain relieved after morphine. Has h/o leaving AMA in the past per records, although patient denies. Do not suspect PE or dissection.        Amount and/or Complexity of Data Reviewed  Clinical lab tests: ordered and reviewed  Tests in the radiology section of CPT??: ordered and reviewed  Independent visualization of images, tracings, or specimens: yes     Risk of Complications, Morbidity, and/or Mortality  Presenting problems: moderate  Diagnostic procedures: moderate  Management options: moderate    Patient Progress  Patient progress: improved      Procedures    PROGRESS NOTES    1550:   Melford Aase, DO arrives to the bedside to evaluate the patient. Answered the patient's questions regarding the treatment plan.    1652:  Pt is currently pain free will repeat Troponin at 1800.    1800:  Pt states that he needs to leave due to a family crisis. Will draw the 2nd troponin labs, and call the pt if they are abnormal. He is leaving AMA.     ED PHYSICIAN ORDERS  Orders Placed This Encounter   ??? XR CHEST PORT     Standing Status: Standing      Number of Occurrences: 1      Standing Expiration Date:      Order Specific Question:  Reason for Exam     Answer:  chest pain   ??? Troponin I     Standing Status: Standing      Number of Occurrences: 1      Standing Expiration Date:    ??? CBC WITH AUTOMATED DIFF     Standing Status: Standing      Number of Occurrences: 1      Standing Expiration Date:    ??? METABOLIC PANEL, COMPREHENSIVE     Standing Status: Standing      Number of Occurrences: 1      Standing Expiration Date:    ??? Troponin I     At 1800     Standing Status: Standing      Number of Occurrences: 1      Standing Expiration Date:    ??? EKG, 12 LEAD, INITIAL     Standing Status: Standing      Number of Occurrences: 1      Standing Expiration Date:      Order Specific Question:  Reason for Exam:     Answer:  chest pain   ??? nitroglycerin (NITROSTAT) tablet 0.4 mg     Sig:    ??? morphine injection 4 mg     Sig:    ??? ondansetron (ZOFRAN) injection 4 mg     Sig:    ??? morphine injection 4 mg     Sig:  MEDICATIONS ORDERED  Medications   nitroglycerin (NITROSTAT) tablet 0.4 mg (0.4 mg SubLINGual Refused 05/12/14 1629)   morphine injection 4 mg (4 mg IntraVENous Given 05/12/14 1629)   ondansetron (ZOFRAN) injection 4 mg (4 mg IntraVENous Given 05/12/14 1629)    morphine injection 4 mg (4 mg IntraVENous Given 05/12/14 1704)       RADIOLOGY INTERPRETATIONS  XR CHEST PORT   Final Result   IMPRESSION:  No acute cardiopulmonary disease.   Melford Aase, DO Impression:  CXR: No acute cardio pulmonary process.     EKG READINGS/LABORATORY RESULTS  Recent Results (from the past 12 hour(s))   EKG, 12 LEAD, INITIAL    Collection Time: 05/12/14  3:31 PM   Result Value Ref Range    Ventricular Rate 87 BPM    Atrial Rate 87 BPM    P-R Interval 152 ms    QRS Duration 106 ms    Q-T Interval 338 ms    QTC Calculation (Bezet) 406 ms    Calculated P Axis 32 degrees    Calculated R Axis 58 degrees    Calculated T Axis 43 degrees    Diagnosis       Normal sinus rhythm  Normal ECG  When compared with ECG of 06-May-2014 21:26,  No significant change was found  Confirmed by Aileen Pilot [phone]) on 05/12/2014 5:41:19 PM     TROPONIN I    Collection Time: 05/12/14  4:00 PM   Result Value Ref Range    Troponin-I, Qt. <0.02 0.0 - 0.045 NG/ML   CBC WITH AUTOMATED DIFF    Collection Time: 05/12/14  4:00 PM   Result Value Ref Range    WBC 4.3 (L) 4.6 - 13.2 K/uL    RBC 3.75 (L) 4.70 - 5.50 M/uL    HGB 11.8 (L) 13.0 - 16.0 g/dL    HCT 35.0 (L) 36.0 - 48.0 %    MCV 93.3 74.0 - 97.0 FL    MCH 31.5 24.0 - 34.0 PG    MCHC 33.7 31.0 - 37.0 g/dL    RDW 13.8 11.6 - 14.5 %    PLATELET 218 135 - 420 K/uL    MPV 9.2 9.2 - 11.8 FL    NEUTROPHILS 58 40 - 73 %    LYMPHOCYTES 32 21 - 52 %    MONOCYTES 6 3 - 10 %    EOSINOPHILS 3 0 - 5 %    BASOPHILS 1 0 - 2 %    ABS. NEUTROPHILS 2.5 1.8 - 8.0 K/UL    ABS. LYMPHOCYTES 1.4 0.9 - 3.6 K/UL    ABS. MONOCYTES 0.3 0.05 - 1.2 K/UL    ABS. EOSINOPHILS 0.1 0.0 - 0.4 K/UL    ABS. BASOPHILS 0.0 0.0 - 0.06 K/UL    DF AUTOMATED     METABOLIC PANEL, COMPREHENSIVE    Collection Time: 05/12/14  4:00 PM   Result Value Ref Range    Sodium 142 136 - 145 mmol/L    Potassium 4.0 3.5 - 5.5 mmol/L    Chloride 109 (H) 100 - 108 mmol/L    CO2 27 21 - 32 mmol/L     Anion gap 6 3.0 - 18 mmol/L    Glucose 105 (H) 74 - 99 mg/dL    BUN 10 7.0 - 18 MG/DL    Creatinine 0.95 0.6 - 1.3 MG/DL    BUN/Creatinine ratio 11 (L) 12 - 20      GFR est AA >60 >60  ml/min/1.52m    GFR est non-AA >60 >60 ml/min/1.725m   Calcium 8.2 (L) 8.5 - 10.1 MG/DL    Bilirubin, total 0.3 0.2 - 1.0 MG/DL    ALT 27 16 - 61 U/L    AST 12 (L) 15 - 37 U/L    Alk. phosphatase 76 45 - 117 U/L    Protein, total 6.9 6.4 - 8.2 g/dL    Albumin 3.8 3.4 - 5.0 g/dL    Globulin 3.1 2.0 - 4.0 g/dL    A-G Ratio 1.2 0.8 - 1.7     TROPONIN I    Collection Time: 05/12/14  6:00 PM   Result Value Ref Range    Troponin-I, Qt. <0.02 0.0 - 0.045 NG/ML       ED DIAGNOSIS & DISPOSITION INFORMATION  Diagnosis:   1. Acute chest pain    2. Left against medical advice        Disposition: Leaving AMA    Follow-up Information     Follow up With Details Comments Contact Info    [facility]  If symptoms worsen [phone]        Discharge Medication List as of 05/12/2014  6:27 PM      CONTINUE these medications which have NOT CHANGED    Details   atorvastatin (LIPITOR) 10 mg tablet Take  by mouth daily., Historical Med      metoprolol (LOPRESSOR) 25 mg tablet Take  by mouth two (2) times a day., Historical Med      clopidogrel (PLAVIX) 75 mg tablet Take  by mouth daily., Historical Med      aspirin (ASPIRIN) 325 mg tablet Take 325 mg by mouth daily., Historical Med               SCRIBE ATTESTATION STATEMENT  Documented by: KeLeveda Annascribing for and in the presence of LaMelford AaseDO. ([phone]   PROVIDER ATTESTATION STATEMENT  I personally performed the services described in the documentation, reviewed the documentation, as recorded by the scribe in my presence, and it accurately and completely records my words and actions.  LaMelford AaseDO. 6:41 PM

## 2014-05-12 NOTE — ED Notes (Signed)
"  I've been having some chest pain and I've had an MI before."

## 2014-05-12 NOTE — ED Notes (Signed)
Patient says my wife was in a car accident and I need to leave.  Patient signed AMA for and left.  Troponin at 1800 done.  MD has patients phone number and will call if positive results.

## 2014-05-13 LAB — CBC WITH AUTOMATED DIFF
BASOPHILS: 0.6 % (ref 0–3)
EOSINOPHILS: 4 % (ref 0–5)
HCT: 33.6 % — ABNORMAL LOW (ref 37.0–50.0)
HGB: 11.6 gm/dl — ABNORMAL LOW (ref 12.4–17.2)
IMMATURE GRANULOCYTES: 0.2 % (ref 0.0–3.0)
LYMPHOCYTES: 22.6 % — ABNORMAL LOW (ref 28–48)
MCH: 32.2 pg (ref 23.0–34.6)
MCHC: 34.5 gm/dl (ref 30.0–36.0)
MCV: 93.3 fL (ref 80.0–98.0)
MONOCYTES: 11.2 % (ref 1–13)
MPV: 9.6 fL (ref 6.0–10.0)
NEUTROPHILS: 61.4 % (ref 34–64)
NRBC: 0 (ref 0–0)
PLATELET: 203 10*3/uL (ref 140–450)
RBC: 3.6 M/uL — ABNORMAL LOW (ref 3.80–5.70)
RDW-SD: 46 — ABNORMAL HIGH (ref 35.1–43.9)
WBC: 4.7 10*3/uL (ref 4.0–11.0)

## 2014-05-13 LAB — METABOLIC PANEL, BASIC
BUN: 9 mg/dl (ref 7–25)
CO2: 25 mEq/L (ref 21–32)
Calcium: 8.6 mg/dl (ref 8.5–10.1)
Chloride: 110 mEq/L — ABNORMAL HIGH (ref 98–107)
Creatinine: 0.9 mg/dl (ref 0.6–1.3)
GFR est AA: >60
GFR est non-AA: >60
Glucose: 98 mg/dl (ref 74–106)
Potassium: 3.9 mEq/L (ref 3.5–5.1)
Sodium: 143 mEq/L (ref 136–145)

## 2014-05-13 LAB — TROPONIN I: Troponin-I: <0.015 ng/ml (ref 0.00–0.09)

## 2014-05-13 NOTE — ED Provider Notes (Addendum)
[facility]  EMERGENCY DEPARTMENT TREATMENT REPORT  NAME:  Gr[place]orian, [name]  SEX:   M  ADMIT: 05/13/2014  DOB:   June 05, 1986  MR#    [id number]  ROOM:  CD04  TIME DICTATED: 04 35 PM  ACCT#  [id number]        I hereby certify this patient for admission based upon medical necessity as  noted below:    CHIEF COMPLAINT:  Near syncope/dislocated shoulder.    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  The patient states that he was in a [employer] when he  suddenly began to feel presyncopal and threw his arm backward to try to catch  himself.  Upon falling, he felt an audible and palpable pop in his right  shoulder and realized that he had dislocated his shoulder anteriorly, which is  common for him.  The patient has had 2 prior surgeries to tighten the  ligaments, per the patient, in the right shoulder and currently denies any  numbness or weakness in that arm, but he does have some tingling along the  medial aspect of the arm into the fingers.  The patient also states that these  presyncopal symptoms are the exact same symptoms he had when he had an MI  which required stent placement.  The patient arrived via EMS and was given 324  mg of aspirin p.o., 10 mg of morphine and 4 mg of Zofran with a 20-gauge  placed in the right forearm.  The patient was only presyncopal and did not  actually lose consciousness during this episode.    REVIEW OF SYSTEMS:  CONSTITUTIONAL:  No fever, chills or weight loss.  EYES:   No visual symptoms.  ENT:  No sore throat, runny nose or other URI symptoms.  HEMATOLOGIC/LYMPHATIC:   No excessive bruising or lymph node swelling.  RESPIRATORY:  No cough, shortness of breath or wheezing.  CARDIOVASCULAR:  No chest pain, chest pressure or palpitations.  GASTROINTESTINAL:  No vomiting, diarrhea or abdominal pain.   GENITOURINARY:  No dysuria, frequency or urgency.  MUSCULOSKELETAL:  Positive for right shoulder pain.  No swelling.  INTEGUMENTARY:  No rashes.  NEUROLOGICAL:  No headaches, sensory or motor symptoms but some tingling of  the right upper extremity.    PAST MEDICAL HISTORY:  Significant for MI with stent placement.    PAST SURGICAL HISTORY:  Two surgeries of the right shoulder.    SOCIAL HISTORY:  The patient denies smoking, illicit drug use and drinks socially.    MEDICATIONS AND ALLERGIES:  Reviewed in [place].    PHYSICAL EXAMINATION:  GENERAL APPEARANCE:.  Patient appears well developed and well nourished.  Appearance and behavior are age and situation appropriate.  In mild to  moderate distress.  HEENT:  Eyes:  Conjunctivae clear, lids normal.  Pupils equal, symmetrical,  and normally reactive.  RESPIRATORY:  Clear and equal breath sounds.  No respiratory distress,  tachypnea, or accessory muscle use.    CARDIOVASCULAR:   Heart regular, without murmurs, gallops, rubs, or thrills.    DP pulses 2+ and equal bilaterally, 2+ radial pulses bilaterally wit brisk  capillary refill present in all digits to bilateral upper extremity.    MUSCULOSKELETAL:  There is prominence of the humeral head anteriorly with a  positive sulcus sign about the right shoulder indicative of anterior  dislocation.  Marland Kitchen.  NEUROLOGIC:  Alert, oriented.  Sensation intact, motor strength equal and  symmetric.  Strength 5 out of 5 to all digits and hand motions of the right  upper extremity.  PSYCHIATRIC:  Judgment appears appropriate.  Oriented to time, place and  person.  Mood and affect appropriate.    INITIAL ASSESSMENT AND MANAGEMENT PLAN:  This is a 28 year old male with past medical history of multiple right  shoulder dislocations who presents with the same.  He is also, however,  presenting with symptoms of presyncope which are identical to his past episode  of MI.  We will obtain chest x-ray and shoulder films as well as troponin, CBC   and BMP.  The patient given 1 mg of Dilaudid, 4 of Zofran and 5 of Valium and  will reevaluate.  Anticipate admitting this patient to cardiology for cardiac  monitoring and provocative testing in the morning possibly.  This is an acute  exacerbation of a chronic condition for this patient.  Old records were  reviewed.  No additional relevant information was obtained.  Nursing notes  were reviewed.    DIAGNOSTIC RESULTS:    EKG:  Dr. Arvella MerlesKisa did not see any acute S-T segment or T-wave abnormalities that  are consistent with acute ischemia or infarction.      CONTINUATION BY Donn PieriniMICHAEL JACKSON, MD:    DIAGNOSTIC RESULTS:   CBC shows a mild anemia of 11.6/33.6 but was otherwise nondiagnostic.  A BMP  was within normal limits and troponin was undetectable.  A chest x-ray showed  a normal chest and  initial and final shoulder series showed a normal  anatomical relationship at the glenohumeral joint.    EMERGENCY DEPARTMENT COURSE:    The patient despite right shoulder film initially showing a reduced shoulder  insisted that shoulder was dislocated and I agree with physical exam.  There  does appear to be some mild dissociation on the films.  Regardless this is  likely at the very least a subluxed right shoulder, right glenohumeral joint.  I was about to reduce the patient's shoulder when all of a sudden he became  diaphoretic and started to experience chest pain at which point we got  immediate EKG  that showed no ischemic changes.  The patient's symptoms  resolved over the course of the next 2 to 3 minutes even without  nitroglycerin.  With the resolution of patient's symptoms and no evidence to  suggest an acute STEMI, we reduced the patient's shoulder see note below.  The  patient the procedure well and was placed in a right arm sling.  The case was  discussed with [facility] physician on-call who stated that he would see the patient  in consultation while the patient is inpatient.  The case was discussed with   Dr. Earlie LouJasarevic and the patient was admitted to his service for observation  telemetry in the hospital for further evaluation and treatment given his  symptoms that are similar to his previous MI.  The patient was informed of the  plan of care and demonstrated full understanding of the need for observation  overnight and appreciated the assistance.      PROCEDURE NOTE   Reduction of right shoulder dislocation was performed by the resident.  The  patient was provided analgesia with a dose of 1 mg of Dilaudid as well as 2.5  mg of Valium.  Five mg was ordered but the patient refused and requested only  2.5.  After adequate  analgesia was achieved, because of his multiple  dislocations we elected to perform the reduction without sedation.  The  patient was placed on O2 monitor and he was consented at the bedside.  We  discussed the risks and benefits of the procedure and the patient signed the  consent form.  A brief timeout was completed.  We identified that we were  reducing the right shoulder.  The patient was identified by name and wrist  band.  With some countertraction by Dr. Arvella MerlesKisa, I pulled traction on the right  shoulder and abducted and externally rotated the shoulder joint and then  abducted it across the chest with good reduction of the glenohumeral joint.  The patient tolerated the procedure well and paresthesias resolved in the  hand.  The patient was   distally and neurovascularly intact pre- and  post-procedure with brisk capillary refill and normal pulses and 5 out of 5  strength to all motions of the right upper extremity.      DIAGNOSIS:  Shoulder dislocation, right.      ADDITIONAL DIAGNOSIS:    Coronary artery disease without angina and near syncope.      DISPOSITION AND PLAN:    The patient admitted to the service of Dr. Earlie LouJasarevic under Hospital  observation with telemetry [facility] consulting.      The patient was personally evaluated by myself and Dr. Arvella MerlesKisa who agrees with   the above assessment and plan.      CONTINUATION BY Vinnie LangtonERIK Alaija Ruble, MD:    INITIAL ASSESSMENT AND MANAGEMENT PLAN:    The patient presents with having syncopal episode.  He has had similar episode  in the past when he had myocardial infarction.  As he was collapsing, he  grabbed something with his right shoulder and he says he dislocated it, which  he has done 12 times before.  He is an ex-[employer].  The patient will be  evaluated further for his syncope and also for his shoulder dislocation which  appears to be anteriorly dislocated.    LABORATORY TESTS:  EKG showed no acute ischemic changes.  The patient had a normal CBC,  electrolytes and troponin.    COURSE IN EMERGENCY DEPARTMENT:  The patient had been treated with Dilaudid for his pain.  On monitor, he  remained in sinus rhythm.  At this time Dr. Carmela HurtFickenscher assumes care of the  patient with Dr. Jean RosenthalJackson.    CLINICAL IMPRESSION:    1.  Acute syncope.    2.  History of myocardial infarction.  3.  Clinically noted shoulder dislocation, right anterior.      ___________________  Smitty CordsErik H Marnee Sherrard MD  Dictated By: Trudee KusterMichael L. Jackson, MD    My signature above authenticates this document and my orders, the final  diagnosis (es), discharge prescription (s), and instructions in the [facility] record.  Nursing notes have been reviewed by the physician/mid-level provider.    If you have any questions please contact [phone].    MLT  D:05/13/2014 16:35:30  T: 05/13/2014 19:23:35  [id number]  Electronically Authenticated by:  Smitty CordsErik H. Elya Tarquinio, M.D. On 05/19/2014 06:11 PM EDT

## 2014-05-13 NOTE — H&P (Addendum)
[facility]  History and Physical  NAME:  [place], [place]  SEX:   M  ADMIT: 05/13/2014  DOB:1985-11-14  MR#    [id number]  ROOM:  CD04  ACCT#  [id number]    I hereby certify this patient for admission based upon medical necessity as  noted below:    <    CHIEF COMPLAINT:  Near syncopal episode.    HISTORY OF PRESENT ILLNESS:  This is a 28 year old gentleman with a known history of coronary artery  disease status post 1 stent placement, unable to tell which location.   This  was performed over one year ago.   He has been on Plavix and aspirin, which he  has been taking regularly.  The patient was in the supermarket when he felt lightheaded and ended up  having a near syncopal episode during which he fell to the floor; however,  denies having lost consciousness.  Denies having hit his head.  He did hit his  right shoulder, sustaining a dislocation which was reduced in the ED.   X-ray  status post reduction show normal anatomical relationship at the glenohumeral  joint.  The patient's cardiac enzymes and EKG unremarkable.  Otherwise, has no  history of seizure disorder.  Denies any tonic-clonic movements, urinary or  fecal incontinence during the episode.  Denied any chest pain, shortness of  breath.  However, during his ED course the patient did complain of chest pain  for about 10 to 15 minutes, this was at that time of shoulder reduction.  The  patient is being admitted for further evaluation of near syncopal episode and  chest pain rule out.    REVIEW OF SYSTEMS:  A 12-point systems was negative except as in HPI.    PAST MEDICAL HISTORY:  Significant for coronary artery disease, hypertension, hyperlipidemia.    PAST SURGICAL HISTORY:  Cardiac catheterization 1 year ago status post 1 stent placed.    FAMILY HISTORY:  Significant for mom who has had her first heart attack at age 142, father has  lung cancer, is 29 years old, and did have a significant history of tobacco   use.    SOCIAL HISTORY:   The patient consumes half a pack of cigarettes, has been smoking for roughly  12 years.  He does not consume any alcohol, no illicit drug use.    ALLERGIES:  PENICILLIN RESULTS IN ANAPHYLAXIS.    HOME MEDICATIONS:  Include Plavix, Lipitor, aspirin and nitroglycerin.    PHYSICAL EXAMINATION:  VITAL SIGNS:  Blood pressure 120/69, pulse 100, respirations 18, temperature  is 98.6, pain 9 out of 10, saturation 98% on room air.  GENERAL APPEARANCE:  No acute distress, resting comfortably in bed, appears  stated age.  HEENT:  Normocephalic, atraumatic.  Extraocular muscles are intact.   Pupils  are equal, round and reactive to light and accommodation.    NECK:  Supple, no JVD, no lymphadenopathy, no thyromegaly.   LUNGS:  Clear to auscultation bilaterally, good air entry, no wheezing.  CARDIOVASCULAR:   Tachycardic, no murmurs, rubs or gallops.  ABDOMEN:  Positive bowel sounds, soft, nontender, nondistended, no  hepatosplenomegaly, no guarding or rebound tenderness.  EXTREMITIES:  No clubbing, cyanosis or edema.  Dorsalis pedis pulses 2+  bilaterally.  SKIN:  No rash, no wounds.  NEUROLOGIC:  Cranial nerves II-XII grossly intact, nonfocal, alert and  oriented times 3.    DIAGNOSTIC DATA:     EKG shows normal sinus rhythm at 85 beats per  minute, no ST segment elevation,  no T-wave inversions, QTC at 402 milliseconds.    LABORATORY RESULTS:  Significant for a chloride of 110, otherwise BMP normal range.  Hemoglobin  11.6, hematocrit 23.6.  Troponin less than 0.015, MCV 93.3.    IMAGING STUDIES:  Chest x-ray shows a normal chest x-ray:  No infiltrates, no pneumothorax.  X-ray of the shoulder shows status post reduction normal anatomical  relationship at the glenohumeral joint.    ASSESSMENT AND PLAN:  1.  Near syncope.   Check 2-D echocardiogram, telemetry monitoring.  Frequent  neuro checks, 2-D echocardiogram ordered, check carotid PVL.  2.  Chest pain, possibly related to shoulder reduction procedure, chest pain   free at this time.  Cardiac enzymes and EKG without any signs of acute  ischemia.  We will trend cardiac enzymes, telemonitoring, pain management as  needed.  3.  Anemia, minimal, no signs of bleeding.   MCV normal range.  Repeat  hemoglobin and hematocrit in the morning.  4.  Shoulder dislocation status post reduction.  Per Emergency Department sign  out.  5.  Hypertension.  This is controlled.  Resume home medications.  6.  Coronary artery disease status post 1 stent placement on Plavix and  aspirin.  We will continue at this time telemonitoring.  7.  Hyperlipidemia.  Continue statin.  8.  Diet is cardiac.  9.  Deep vein thrombosis reflexes with sequential compression devices.  The  patient otherwise ambulatory.    DISPOSITION:  Home in 1 to 2 days depending on response to treatment.  Will be placed on  telemonitoring floor under observation status.    CODE STATUS:  FULL CODE.    Case discussed with ER attending, patient and nursing staff.      ___________________  Irving BurtonMuhamed Jerzee Jerome MD  Dictated By: .   [facility]  D:05/13/2014 21:15:27  T: 05/13/2014 21:49:53  [id number]  Electronically Authenticated by:  Irving BurtonMUHAMED Constantina Laseter, MD On 05/15/2014 07:10 PM EDT

## 2014-05-14 LAB — CKMB PROFILE
CK - MB: 2 ng/ml (ref 0.0–3.6)
CK-MB Index: 0.7 % (ref 0.0–4.9)
CK: 278 U/L (ref 39–308)

## 2014-05-14 LAB — TROPONIN I: Troponin-I: <0.015 ng/ml (ref 0.00–0.09)

## 2014-05-15 NOTE — Consults (Addendum)
[facility]  CONSULTATION REPORT  NAME:  [name], [place]  SEX:   M  ADMIT: 05/13/2014  DATE OF CONSULT: 05/15/2014  REFERRING PHYSICIAN:    DOB: [birth year]  MR#    [id number]  ROOM:  CD04  ACCT#  [id number]    cc: Hillary BowSamuel Amarius Toto MD    REASON FOR CONSULTATION:  Right shoulder dislocation.    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  He apparently had surgery  related to this some 3 to 4 years ago, but has had several episodes of  recurrent dislocation or subluxation.    [facility]  [facility] evening for symptoms of syncope and \\shoulder dislocation.\\  He was  reduced in the emergency room; however, at this time claims that his shoulder  is dislocated once again and that he is having severe pain.    PAST MEDICAL HISTORY:  Significant for history of multiple medical problems including what the  patient states is cardiovascular disease and history of previous \\MI\\  (no documented history of this).    SOCIAL HISTORY:  The patient admits to alcohol use and other street related drugs in the past  but denies that he using it currently.    PHYSICAL EXAMINATION:  GENERAL:  Reveals a 28 year old male who is healthy in appearance and in no  acute distress.  MUSCULOSKELETAL:  Examination  of the right upper extremity shows that the  patient is holding his arm in extension and abduction in a somewhat spastic  position claiming that he is dislocated.    There no deformity and neurovascular exam intact.    Simple reduction was carried out with repositioning the shoulder and  relaxation.  There is no audible clunk etc. which would indicate that indeed  the shoulder is only partially subluxated if at all.    X-RAYS:  X-rays are pending.    IMPRESSION:  1.  Right shoulder chronic laxity  2.  Possible solitary shoulder dislocator.  3.  Possible drug seeking.    PLAN:   We discussed the patient with his attending physician and the patient  apparently is planning to go back to his home in [place].    Arm sling is all that is needed at this time and he may be discharged related  to his shoulder at any time.    Thank you for this consultation.      ___________________  Phil DoppSamuel I Hayk Divis MD  Dictated By:.   [facility]  D:05/15/2014 04:54:0914:23:21  T: 05/15/2014 14:40:40  [id number]  Electronically Authenticated and Linus OrnEdited by:  Alinda DoomsSamuel I. Manson PasseyBrown, M.D. On 05/19/2014 09:35 AM EDT

## 2014-05-16 LAB — CKMB PROFILE
CK - MB: 1 ng/ml (ref 0.0–3.6)
CK-MB Index: 0.6 % (ref 0.0–4.9)
CK: 159 U/L (ref 39–308)

## 2014-05-16 LAB — TROPONIN I: Troponin-I: <0.015 ng/ml (ref 0.00–0.09)

## 2014-05-17 LAB — TROPONIN I: Troponin-I: <0.015 ng/ml (ref 0.00–0.09)

## 2014-05-17 LAB — CKMB PROFILE
CK - MB: 1.2 ng/ml (ref 0.0–3.6)
CK-MB Index: 0.6 % (ref 0.0–4.9)
CK: 201 U/L (ref 39–308)

## 2014-05-17 NOTE — Discharge Summary (Addendum)
[facility]  Discharge Summary   NAME:  [name], [place]  SEX:   M  ADMIT: 05/13/2014  DISCH:   DOB:   1985-12-23  MR#    [id number]  ACCT#  [id number]    cc: [name] , Evan [name] , [facility]     DATE OF ADMISSION:   05/13/2014     DATE OF DISCHARGE:  05/17/2014     CONDITION ON DISCHARGE:    He is slightly improved on discharge.      DIET:    Should be as usual.      ACTIVITY:   Should be as usual, but he should not be driving for a while.  I think he  should not be driving for at least 6 months.  I am going to leave it up to his  cardiologist but it is either that or 6 months because he does not know why he  fell.      FOLLOWUP:    He should follow up with an orthopedist of choice down in [place].  He should  follow up with Dr. Renee PainJames [name], his cardiologist, within a week or so.  The recommendation is for him to maybe get a ZIO patch or some sort of  long-term monitoring device if he thinks that is needed.  [facility] [place] [facility], although that may take a while.    DISCHARGE MEDICATIONS:  Same as on admission which include:  Nitroglycerin sublingual as needed,  Atorvastatin 20 mg a day.  I have given him a prescription of oxycodone 5/325  #20 of them for [name], aspirin 81 mg a day, metoprolol 50 mg b.i.d., Nicoderm  patch as needed to help quit smoking, and clopidogrel (Plavix) 75 a day.      DISCHARGE INSTRUCTIONS:   He was counseled in quitting smoking and the importance of this with his  disease state.    DISCHARGE DIAGNOSES:  1.  Syncope with workup that showed normal echo with no valvular  abnormalities, telemetry which remained in sinus rhythm.  He got carotid PVLs  for what that is worth, that did not show anything.  2.  Known coronary disease.  3.  Hyperlipidemia.  4.  Dislocated anterior subluxation of the right humerus, no identifiable  fracture.   5.  DSM-V, Posttraumatic stress disorder, by our Tele-psych, but he also   seemed to be oppositional and I will leave that to his followup to see.     Please see the H&P as dictated by Dr. Earlie [name] for details.    HOSPITAL COURSE:  1.  Shoulder, anterior dislocation.  The patient was seen and given a sling.  He wore it some, though he feels it is not comfortable.  He will need followup  for this.  He was seen by Dr. Mathews ArgyleSam [name], who noted a 12-year history of  chronic shoulder dislocations.  He thought it was nothing significant.  It was  only partially subluxation and thought just to wear a sling and follow up at  that time.  Unfortunately, he does like Dilaudid for this [name].  2.  Syncope, as such.  Workup has been very unrewarding.  He had an echo that  was normal.  He was monitored on and off for days.  Unfortunately, he kept  taking the telemetry off, leaving the floor, not wanting to wear it but when  he had it on, which was probably  enough, there was only sinus rhythm.  He had  some dizziness.  He had recurrent chest [name] on and off, which had negative  troponins and negative EKG at this time.  He was reassured that his coronary  status seems stable.  I am not sure if the thought was maybe doing a ZIO  patch, but there are other underlying issues and I will leave that to his  usual doctors to see.    3.  Hyperlipidemia, was put on his Lipitor and continued.  4.  PTSD.  This is a diagnosis our Tele-Psych has come up with.  He was very  oppositional the whole time he was here, refusing treatments, refusing to get  things done, had to be dealt with.  He left the floor many, many, many times,  probably to go smoke or buy coffee and things.  Of course, he did not fall or  have any problems when he was down with this.  I am not sure what is really  going on with him, but I think following up with Psych would be very, very,  very valuable.  5.  Tobacco abuse.  He was counseled at cessation.     Total time coordinating care for discharge today was 36 minutes.      ___________________   Evan BowlJULIUS S Raley Novicki MD  Dictated By: .   [place]  D:05/17/2014 08:01:54  T: 05/17/2014 09:05:42  [id number]  Electronically Authenticated by:  Evan [name], M.D. On 05/17/2014 02:30 PM EDT

## 2014-05-26 LAB — DRUG ABUSE 7 SCREEN, SERUM

## 2015-06-23 ENCOUNTER — Encounter: Payer: Self-pay | Admitting: Emergency Medicine

## 2015-06-23 ENCOUNTER — Emergency Department

## 2015-06-23 ENCOUNTER — Emergency Department
Admission: EM | Admit: 2015-06-23 | Discharge: 2015-06-23 | Disposition: A | Discharge status: Home / Self Care | Attending: Emergency Medicine | Admitting: Emergency Medicine

## 2015-06-23 DIAGNOSIS — R079 Chest pain, unspecified: Secondary | ICD-10-CM | POA: Diagnosis present

## 2015-06-23 DIAGNOSIS — R0789 Other chest pain: Secondary | ICD-10-CM

## 2015-06-23 DIAGNOSIS — Z79899 Other long term (current) drug therapy: Secondary | ICD-10-CM | POA: Insufficient documentation

## 2015-06-23 DIAGNOSIS — Z7982 Long term (current) use of aspirin: Secondary | ICD-10-CM | POA: Insufficient documentation

## 2015-06-23 DIAGNOSIS — Z87891 Personal history of nicotine dependence: Secondary | ICD-10-CM | POA: Insufficient documentation

## 2015-06-23 DIAGNOSIS — Z880 Allergy status to penicillin: Secondary | ICD-10-CM | POA: Diagnosis not present

## 2015-06-23 DIAGNOSIS — F1110 Opioid abuse, uncomplicated: Secondary | ICD-10-CM | POA: Diagnosis not present

## 2015-06-23 DIAGNOSIS — R413 Other amnesia: Secondary | ICD-10-CM

## 2015-06-23 DIAGNOSIS — I10 Essential (primary) hypertension: Secondary | ICD-10-CM | POA: Insufficient documentation

## 2015-06-23 DIAGNOSIS — F04 Amnestic disorder due to known physiological condition: Secondary | ICD-10-CM | POA: Diagnosis not present

## 2015-06-23 DIAGNOSIS — F1310 Sedative, hypnotic or anxiolytic abuse, uncomplicated: Secondary | ICD-10-CM

## 2015-06-23 DIAGNOSIS — R531 Weakness: Secondary | ICD-10-CM

## 2015-06-23 DIAGNOSIS — R200 Anesthesia of skin: Secondary | ICD-10-CM | POA: Diagnosis not present

## 2015-06-23 DIAGNOSIS — Z765 Malingerer [conscious simulation]: Secondary | ICD-10-CM

## 2015-06-23 HISTORY — DX: Other amnesia: R41.3

## 2015-06-23 HISTORY — DX: Personal history of other diseases of the nervous system and sense organs: Z86.69

## 2015-06-23 HISTORY — DX: Malingerer (conscious simulation): Z76.5

## 2015-06-23 HISTORY — DX: Recurrent dislocation, unspecified shoulder: M24.419

## 2015-06-23 HISTORY — DX: Personal history of other specified conditions: Z87.898

## 2015-06-23 HISTORY — DX: Personality disorder, unspecified: F60.9

## 2015-06-23 LAB — COMPREHENSIVE METABOLIC PANEL
ALT: 18 U/L (ref 17–63)
AST: 16 U/L (ref 15–41)
Albumin: 4.6 g/dL (ref 3.5–5.0)
Alkaline Phosphatase: 67 U/L (ref 38–126)
Anion gap: 7 (ref 5–15)
BILIRUBIN TOTAL: 0.3 mg/dL (ref 0.3–1.2)
BUN: 15 mg/dL (ref 6–20)
CO2: 25 mmol/L (ref 22–32)
CREATININE: 1 mg/dL (ref 0.61–1.24)
Calcium: 9.5 mg/dL (ref 8.9–10.3)
Chloride: 108 mmol/L (ref 101–111)
GFR calc Af Amer: >60 mL/min (ref >60)
Glucose, Bld: 96 mg/dL (ref 65–99)
POTASSIUM: 4.8 mmol/L (ref 3.5–5.1)
Sodium: 140 mmol/L (ref 135–145)
TOTAL PROTEIN: 8.1 g/dL (ref 6.5–8.1)

## 2015-06-23 LAB — CBC
HEMATOCRIT: 42 % (ref 40.0–52.0)
HEMOGLOBIN: 14.2 g/dL (ref 13.0–18.0)
MCH: 31.7 pg (ref 26.0–34.0)
MCHC: 33.9 g/dL (ref 32.0–36.0)
MCV: 93.4 fL (ref 80.0–100.0)
Platelets: 237 10*3/uL (ref 150–440)
RBC: 4.49 MIL/uL (ref 4.40–5.90)
RDW: 14 % (ref 11.5–14.5)
WBC: 4.3 10*3/uL (ref 3.8–10.6)

## 2015-06-23 LAB — TROPONIN I

## 2015-06-23 NOTE — ED Provider Notes (Signed)
[facility] Emergency Department Provider Note  ____________________________________________  Time seen: Approximately 12:03 PM  I have reviewed the triage vital signs and the nursing notes.   HISTORY  Chief Complaint Chest Pain    HPI Alex Sandoval is a 29 y.o. 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  He states that this happened suddenly when he was being transported back to his facility from [place] prison after an evaluation by a cardiologist.He states that he cannot move his left arm or left leg against gravity and that his chest pain is severe.  He denies fever/chills, shortness of breath, abdominal pain, nausea/vomiting.  He states that he has had similar episodes in the past and that he has received TPA for a stroke.  He believes this occurred in [place] about a year and a half ago.  He also states that he has 2 cardiac stents and that he has been seen at [facility] for an MI.  He states that he believes his heart disease is hereditary.  He was also told that he has a "small aneurysm", but he is not sure if this was in his heart or his head.  The symptoms occurred less than an hour prior to arrival at this emergency Department.   Past Medical History  Diagnosis Date  . Seizures ([place])   . Post traumatic stress disorder   . Drug abuse   . Hypertension   . Confabulation   . Malingering   . Shoulder dislocation, recurrent     SELF-INDUCED IN ATTEMPT TO OBTAIN OPIATES  . Personality disorder   . History of pseudoseizure     Patient Active Problem List   Diagnosis Date Noted  . Opiate abuse, continuous  01/23/2012    Class: Acute  . Benzodiazepine abuse, continuous 01/23/2012    Class: Acute    Past Surgical History  Procedure Laterality Date  . Appendectomy      Current Outpatient Rx  Name  Route  Sig  Dispense  Refill  . aspirin 81 MG chewable tablet   Oral   Chew 81 mg by mouth daily.         . cloNIDine (CATAPRES) 0.1 MG tablet   Oral   Take 0.1 mg by mouth at bedtime.         . cloNIDine (CATAPRES) 0.1 MG tablet   Oral   Take 0.1 mg by mouth 2 (two) times daily.         . metoprolol (LOPRESSOR) 50 MG tablet   Oral   Take 50 mg by mouth 2 (two) times daily.           Allergies Benadryl; Penicillins; and Penicillins  History reviewed. No pertinent family history.  Social History Social History  Substance Use Topics  . Smoking status: Former Smoker -- 0.50 packs/day    Types: Cigarettes  . Smokeless tobacco: None  . Alcohol Use: No    Review of Systems Constitutional: No fever/chills Eyes: No visual changes. ENT: No sore throat.  He reports some difficulty swallowing "on the left side" Cardiovascular: Left-sided chest pain radiating through to his back Respiratory: Denies shortness of breath. Gastrointestinal: No abdominal pain.  No nausea, no vomiting.  No  diarrhea.  No constipation. Genitourinary: Negative for dysuria. Musculoskeletal: Negative for back pain. Skin: Negative for rash. Neurological: Left arm and leg weakness as well as left facial numbness and droop  10-point ROS otherwise negative.  ____________________________________________   PHYSICAL EXAM:  VITAL SIGNS: ED Triage Vitals  Enc Vitals Group     BP 06/23/15 1149 160/95 mmHg     Pulse Rate 06/23/15 1149 71     Resp 06/23/15 1149 18     Temp 06/23/15 1149 98.4 F (36.9 C)     Temp Source 06/23/15 1149 Oral     SpO2 06/23/15 1149 98 %     Weight 06/23/15 1149 200 lb (90.719 kg)     Height 06/23/15 1149 6' (1.829 m)     Head Cir --      Peak Flow --      Pain Score  06/23/15 1149 8     Pain Loc --      Pain Edu? --      Excl. in [place]? --     Constitutional: Alert and oriented. Well appearing and in no acute distress.  Inconsistent left-sided facial droop. Eyes: Conjunctivae are normal. PERRL. EOMI. Head: Atraumatic. Nose: No congestion/rhinnorhea. Mouth/Throat: Mucous membranes are moist.  Oropharynx non-erythematous. Neck: No stridor.   Cardiovascular: Normal rate, regular rhythm. Grossly normal heart sounds.  Good peripheral circulation. Respiratory: Normal respiratory effort.  No retractions. Lungs CTAB. Gastrointestinal: Soft and nontender. No distention. No abdominal bruits. No CVA tenderness. Musculoskeletal: No lower extremity tenderness nor edema.  No joint effusions. Neurologic:  The patient has weakness on the left side including left arm and left leg with minimal effort against gravity.  Additionally he has left-sided facial droop notable only in the left side of his mouth, but this is inconsistent and he can speak normally with no evidence of dysarthria or aphasia and the droop seems to minimize or go away completely and then returns when he is at rest. Skin:  Skin is warm, dry and intact. No rash noted. Psychiatric: Mood and affect are normal. Speech and behavior are normal.  ____________________________________________   LABS (all labs ordered are listed, but only abnormal results are displayed)  Labs Reviewed  CBC  COMPREHENSIVE METABOLIC PANEL  TROPONIN I   ____________________________________________  EKG  ED ECG REPORT I, Sania Noy, the attending physician, personally viewed and interpreted this ECG.  Date: 06/23/2015 EKG Time: 11:45 Rate: 78 Rhythm: normal sinus rhythm QRS Axis: normal Intervals: normal ST/T Wave abnormalities: normal Conduction Disutrbances: none Narrative Interpretation: unremarkable  ____________________________________________  RADIOLOGY   Ct Head Wo Contrast  06/23/2015  CLINICAL  DATA:  Acute onset left-sided facial numbness EXAM: CT HEAD WITHOUT CONTRAST TECHNIQUE: Contiguous axial images were obtained from the base of the skull through the vertex without intravenous contrast. COMPARISON:  September 03, 2011 FINDINGS: The ventricles are normal in size and configuration. There is no intracranial mass, hemorrhage, extra-axial fluid collection, or midline shift. The gray-white compartments are normal. No acute infarct is evident. The bony calvarium appears intact. The mastoid air cells clear. There are retention cysts in each superior maxillary antrum. No intraorbital lesions are appreciable. IMPRESSION: Bilateral maxillary sinus disease. No intracranial mass, hemorrhage, or focal gray - white compartment lesions/acute appearing infarct. Electronically Signed   By: Lowella Grip III M.D.   On: 06/23/2015 12:29    ____________________________________________   PROCEDURES  Procedure(s) performed: None  Critical Care performed: No ____________________________________________   INITIAL IMPRESSION / ASSESSMENT AND PLAN /  ED COURSE  Pertinent labs & imaging results that were available during my care of the patient were reviewed by me and considered in my medical decision making (see chart for details).  We sent the patient for a stat head CT given the concern initially for acute CVA.  This allowed me the opportunity to then obtain a history from the patient which was fraught with inconsistencies, both physiologically and historically based on his prior similar experiences.  After my initial assessment I reviewed [facility] for his past medical records, of which there are many.  Most notable was an inpatient stay at [facility] in [place] less than one month ago.  The patient was headed back to prison after a court date when he complained of symptoms identical to those of which she complains today.  He was taken to [facility] and received TPA given concern for acute CVA.  However, after  extensive evaluation including MRI/MRA as well as echocardiogram and CT scans of his chest to rule out aortic dissection, no physiological abnormalities were uncovered.  There was also no comment of any stents and is coronary arteries although this may have just not been commented on in the documentation.  Additionally there are multiple records from different emergency departments and specialists including a cardiology at [facility] that corroborate his malingering and confabulation, drug-seeking behavior, self-induced shoulder dislocations, etc.  I explained to the patient today that his labs and his head CT are reassuring, and since he went through exactly the same situation less than a month ago and had an extremely thorough workup, including potentially life-threatening TPA, the reason for his to repeat the testing today.  During our conversation his left-sided facial droop improved significantly and he asked what is causing his numbness.  I explained again that we do not have a physiological reason but that since his symptoms improved dramatically without specific treatment last month that I anticipate he will again improve and suggested that perhaps the anxiety and stress that he is under could contribute.  After I left the room the police officer me that the patient became angry and began trying to pull out his IVs in spite of being handcuffed.  Of note he was using his left arm without any difficulty at this point.  The patient is being discharged in police custody back to prison.  I strongly doubt the presence of any acute/emergent cardiac or neurological abnormality leading to his symptoms which are strongly consistent with confabulation and malingering.  ____________________________________________  FINAL CLINICAL IMPRESSION(S) / ED DIAGNOSES  Final diagnoses:  Malingering  Confabulation  Opiate abuse, continuous  Benzodiazepine abuse, continuous  Atypical chest pain  Left sided numbness   Left-sided weakness      NEW MEDICATIONS STARTED DURING THIS VISIT:  Discharge Medication List as of 06/23/2015  1:15 PM       Hinda Kehr, MD 06/23/15 1330

## 2015-06-23 NOTE — ED Notes (Signed)
[place] is [place] [facility]  when he started having left sided cp and left side facial numbness with diaphoresis and nausea, states symptoms started about 1115

## 2015-06-23 NOTE — Discharge Instructions (Signed)
As we discussed, your workup today was normal, just as it was last month at [facility] in [place] when you had extensive testing and imaging of your head, neck, chest, and heart.  We cannot identify a specific reason for your symptoms, but your workup continues to be reassuring.  Follow up with your prison physician at the next available opportunity.

## 2016-02-15 ENCOUNTER — Encounter (HOSPITAL_COMMUNITY): Payer: Self-pay

## 2016-02-15 ENCOUNTER — Emergency Department: Payer: Medicare Other

## 2016-02-15 ENCOUNTER — Emergency Department
Admission: EM | Admit: 2016-02-15 | Discharge: 2016-02-15 | Disposition: A | Discharge status: Home / Self Care | Payer: Medicare Other | Attending: Emergency Medicine | Admitting: Emergency Medicine

## 2016-02-15 ENCOUNTER — Emergency Department (HOSPITAL_COMMUNITY)
Admission: EM | Admit: 2016-02-15 | Discharge: 2016-02-15 | Disposition: A | Discharge status: Home / Self Care | Payer: Medicare Other | Attending: Emergency Medicine | Admitting: Emergency Medicine

## 2016-02-15 ENCOUNTER — Encounter: Payer: Self-pay | Admitting: Emergency Medicine

## 2016-02-15 ENCOUNTER — Emergency Department (HOSPITAL_COMMUNITY): Payer: Medicare Other

## 2016-02-15 ENCOUNTER — Emergency Department (HOSPITAL_COMMUNITY)
Admission: EM | Admit: 2016-02-15 | Discharge: 2016-02-15 | Disposition: A | Discharge status: Home / Self Care | Payer: Medicare Other | Source: Home / Self Care

## 2016-02-15 DIAGNOSIS — W19XXXA Unspecified fall, initial encounter: Secondary | ICD-10-CM | POA: Insufficient documentation

## 2016-02-15 DIAGNOSIS — Y9389 Activity, other specified: Secondary | ICD-10-CM | POA: Insufficient documentation

## 2016-02-15 DIAGNOSIS — Z87891 Personal history of nicotine dependence: Secondary | ICD-10-CM

## 2016-02-15 DIAGNOSIS — Z5321 Procedure and treatment not carried out due to patient leaving prior to being seen by health care provider: Secondary | ICD-10-CM

## 2016-02-15 DIAGNOSIS — I10 Essential (primary) hypertension: Secondary | ICD-10-CM

## 2016-02-15 DIAGNOSIS — S4991XA Unspecified injury of right shoulder and upper arm, initial encounter: Secondary | ICD-10-CM | POA: Insufficient documentation

## 2016-02-15 DIAGNOSIS — M25511 Pain in right shoulder: Secondary | ICD-10-CM | POA: Diagnosis present

## 2016-02-15 DIAGNOSIS — Y929 Unspecified place or not applicable: Secondary | ICD-10-CM | POA: Insufficient documentation

## 2016-02-15 DIAGNOSIS — Y990 Civilian activity done for income or pay: Secondary | ICD-10-CM | POA: Insufficient documentation

## 2016-02-15 DIAGNOSIS — Z7982 Long term (current) use of aspirin: Secondary | ICD-10-CM | POA: Diagnosis not present

## 2016-02-15 DIAGNOSIS — Y939 Activity, unspecified: Secondary | ICD-10-CM | POA: Diagnosis not present

## 2016-02-15 DIAGNOSIS — S43004A Unspecified dislocation of right shoulder joint, initial encounter: Secondary | ICD-10-CM | POA: Diagnosis not present

## 2016-02-15 DIAGNOSIS — Z8669 Personal history of other diseases of the nervous system and sense organs: Secondary | ICD-10-CM | POA: Insufficient documentation

## 2016-02-15 DIAGNOSIS — Z79899 Other long term (current) drug therapy: Secondary | ICD-10-CM | POA: Insufficient documentation

## 2016-02-15 DIAGNOSIS — W1830XA Fall on same level, unspecified, initial encounter: Secondary | ICD-10-CM | POA: Insufficient documentation

## 2016-02-15 MED ORDER — FENTANYL CITRATE (PF) 100 MCG/2ML IJ SOLN: 50.0000 ug | INTRAMUSCULAR | Status: DC | PRN

## 2016-02-15 MED ORDER — NAPROXEN 500 MG PO TABS: 500.0000 mg | ORAL_TABLET | Freq: Two times a day (BID) | ORAL | Status: DC

## 2016-02-15 NOTE — ED Notes (Signed)
Shoulder immobilizer applied .

## 2016-02-15 NOTE — ED Notes (Signed)
Called patient. Patient did not answer

## 2016-02-15 NOTE — ED Notes (Signed)
Patient called and no answer. 

## 2016-02-15 NOTE — ED Provider Notes (Signed)
[facility] Emergency Department Provider Note   ____________________________________________    I have reviewed the triage vital signs and the nursing notes.   HISTORY  Chief Complaint Dislocation     HPI Alex Sandoval is a 30 y.o. male who presents with complaints of a dislocated right shoulder. He complains of moderate to severe right shoulder pain. He reports that he got dizzy and fell at work and landed on shoulder. He states he had surgery on the same shoulder 2 months ago. He denies other injuries. Review of records demonstrates multiple visits to different ED's within the last 5 months.   Past Medical History  Diagnosis Date  . Seizures ([place])   . Post traumatic stress disorder   . Drug abuse   . Hypertension   . Confabulation   . Malingering   . Shoulder dislocation, recurrent     SELF-INDUCED IN ATTEMPT TO OBTAIN OPIATES  . Personality disorder   . History of pseudoseizure     Patient Active Problem List   Diagnosis Date Noted  . Opiate abuse, continuous 01/23/2012    Class: Acute  . Benzodiazepine abuse, continuous 01/23/2012    Class: Acute    Past Surgical History  Procedure Laterality Date  . Appendectomy      Current Outpatient Rx  Name  Route  Sig  Dispense  Refill  . aspirin 81 MG chewable tablet   Oral   Chew 81 mg by mouth daily.         . cloNIDine (CATAPRES) 0.1 MG tablet   Oral   Take 0.1 mg by mouth at bedtime.         . cloNIDine (CATAPRES) 0.1 MG tablet   Oral   Take 0.1 mg by mouth 2 (two) times daily.         . metoprolol (LOPRESSOR) 50 MG tablet   Oral   Take 50 mg by mouth 2 (two) times daily.           Allergies Benadryl; Penicillins; and Penicillins  No family history on file.  Social History Social History  Substance Use Topics  . Smoking status: Former Smoker -- 0.50 packs/day    Types: Cigarettes  . Smokeless tobacco: None  . Alcohol Use: No    Review of  Systems  Constitutional: No Dizziness currently Eyes: No visual changes.  ENT: No neck pain Cardiovascular: Denies chest pain. No palpitations Respiratory: Denies shortness of breath. Gastrointestinal: No abdominal pain.   Musculoskeletal: Right shoulder pain Skin: Negative for rash. Neurological: Negative for headaches or weakness  10-point ROS otherwise negative.  ____________________________________________   PHYSICAL EXAM:  VITAL SIGNS: ED Triage Vitals  Enc Vitals Group     BP 02/15/16 1406 138/83 mmHg     Pulse Rate 02/15/16 1406 91     Resp 02/15/16 1406 20     Temp 02/15/16 1406 98.8 F (37.1 C)     Temp Source 02/15/16 1406 Oral     SpO2 02/15/16 1406 97 %     Weight 02/15/16 1406 180 lb (81.647 kg)     Height 02/15/16 1406 6' (1.829 m)     Head Cir --      Peak Flow --      Pain Score 02/15/16 1407 8     Pain Loc --      Pain Edu? --      Excl. in [place]? --     Constitutional: Alert and oriented. No acute distress.Holding  right shoulder Eyes: Conjunctivae are normal.  Head: Atraumatic.Normocephalic N Mouth/Throat: Mucous membranes are moist.   Neck: Painless ROM Cardiovascular: Normal rate, regular rhythm.  Good peripheral circulation. Respiratory: Normal respiratory effort.  No retractions.  Gastrointestinal: Soft and nontender. No distention.  No CVA tenderness. Genitourinary: deferred Musculoskeletal: Anterior fullness of the right shoulder, painful passive range of motion. 2+ distal pulses Neurologic:  Normal speech and language. No gross focal neurologic deficits are appreciated.  Skin:  Skin is warm, dry and intact. No rash noted. Psychiatric: Mood and affect are normal. Speech and behavior are normal.  ____________________________________________   LABS (all labs ordered are listed, but only abnormal results are displayed)  Labs Reviewed - No data to  display ____________________________________________  EKG  None ____________________________________________  RADIOLOGY   ____________________________________   PROCEDURES  Procedure(s) performed: yes  Reduction of dislocation Date/Time: 2:22 PM Performed by: Lavonia Drafts Authorized by: Lavonia Drafts Consent: Verbal consent obtained. Risks and benefits: risks, benefits and alternatives were discussed Consent given by: patient   Vitals: Vital signs were monitored during sedation. Patient tolerance: Patient tolerated the procedure well with no immediate complications. Joint: right shoulder  Reduction technique: FARES      Critical Care performed: No ____________________________________________   INITIAL IMPRESSION / ASSESSMENT AND PLAN / ED COURSE  Pertinent labs & imaging results that were available during my care of the patient were reviewed by me and considered in my medical decision making (see chart for details).  Patient patient has been seen at 5 different emergency departments in the last 5 months. This is extremely concerning for drug-seeking behavior. Regardless his shoulder does seem to be dislocated, at least clinically.  Record demonstrates patient has a hx of self-induced shoulder dislocation.  Reduction performed by me using FARES technique without sedation, successful, patient tolerated well. Pending XR.   ----------------------------------------- 3:02 PM on 02/15/2016 -----------------------------------------  X-ray demonstrated that further adjustment was necessary, this was done with normal anatomical alignment, x-ray repeated  ----------------------------------------- 3:19 PM on 02/15/2016 -----------------------------------------  Confirmed clinically that shoulder is relocated. Patient is able to dislocate his shoulder at will and did so several times which required simple manual reduction before we were able to get him in a shoulder  immobilizer. I have asked him to follow with his surgeon at [facility]. No narcotics given ____________________________________________   FINAL CLINICAL IMPRESSION(S) / ED DIAGNOSES  Final diagnoses:  Shoulder dislocation, right, initial encounter      NEW MEDICATIONS STARTED DURING THIS VISIT:  New Prescriptions   No medications on file     Note:  This document was prepared using Dragon voice recognition software and may include unintentional dictation errors.    Lavonia Drafts, MD 02/15/16 [phone]

## 2016-02-15 NOTE — ED Notes (Signed)
Pt here with right shoulder dislocation. He reports right shoulder pain and there is visible deformity. He states he got very hot at work today and fell backwards but denies LOC. Hx of MI a year ago. He also reports he just recently had surgery on his right shoulder 2 months ago due to it being dislocated.

## 2016-02-15 NOTE — Discharge Instructions (Signed)
Shoulder Dislocation A shoulder dislocation happens when the upper arm bone (humerus) moves out of the shoulder joint. The shoulder joint is the part of the shoulder where the humerus, shoulder blade (scapula), and collarbone (clavicle) meet. CAUSES This condition is often caused by:  A fall.  A hit to the shoulder.  A forceful movement of the shoulder. RISK FACTORS This condition is more likely to develop in people who play sports. SYMPTOMS Symptoms of this condition include:  Deformity of the shoulder.  Intense pain.  Inability to move the shoulder.  Numbness, weakness, or tingling in your neck or down your arm.  Bruising or swelling around your shoulder. DIAGNOSIS This condition is diagnosed with a physical exam. After the exam, tests may be done to check for related problems. Tests that may be done include:  X-ray. This may be done to check for broken bones.  MRI. This may be done to check for damage to the tissues around the shoulder.  Electromyogram. This may be done to check for nerve damage. TREATMENT This condition is treated with a procedure to place the humerus back in the joint. This procedure is called a reduction. There are two types of reduction:  Closed reduction. In this procedure, the humerus is placed back in the joint without surgery. The health care provider uses his or her hands to guide the bone back into place.  Open reduction. In this procedure, the humerus is placed back in the joint with surgery. An open reduction may be recommended if:  You have a weak shoulder joint or weak ligaments.  You have had more than one shoulder dislocation.  The nerves or blood vessels around your shoulder have been damaged. After the humerus is placed back into the joint, your arm will be placed in a splint or sling to prevent it from moving. You will need to wear the splint or sling until your shoulder heals. When the splint or sling is removed, you may have  physical therapy to help improve the range of motion in your shoulder joint. HOME CARE INSTRUCTIONS If You Have a Splint or Sling:  Wear it as told by your health care provider. Remove it only as told by your health care provider.  Loosen it if your fingers become numb and tingle, or if they turn cold and blue.  Keep it clean and dry. Bathing  Do not take baths, swim, or use a hot tub until your health care provider approves. Ask your health care provider if you can take showers. You may only be allowed to take sponge baths for bathing.  If your health care provider approves bathing and showering, cover your splint or sling with a watertight plastic bag to protect it from water. Do not let the splint or sling get wet. Managing Pain, Stiffness, and Swelling  If directed, apply ice to the injured area.  Put ice in a plastic bag.  Place a towel between your skin and the bag.  Leave the ice on for 20 minutes, 2-3 times per day.  Move your fingers often to avoid stiffness and to decrease swelling.  Raise (elevate) the injured area above the level of your heart while you are sitting or lying down. Driving  Do not drive while wearing a splint or sling on a hand that you use for driving.  Do not drive or operate heavy machinery while taking pain medicine. Activity  Return to your normal activities as told by your health care provider. Ask your  health care provider what activities are safe for you.  Perform range-of-motion exercises only as told by your health care provider.  Exercise your hand by squeezing a soft ball. This helps to decrease stiffness and swelling in your hand and wrist. General Instructions  Take over-the-counter and prescription medicines only as told by your health care provider.  Do not use any tobacco products, including cigarettes, chewing tobacco, or e-cigarettes. Tobacco can delay bone and tissue healing. If you need help quitting, ask your health care  provider.  Keep all follow-up visits as told by your health care provider. This is important. SEEK MEDICAL CARE IF:  Your splint or sling gets damaged. SEEK IMMEDIATE MEDICAL CARE IF:  Your pain gets worse rather than better.  You lose feeling in your arm or hand.  Your arm or hand becomes white and cold.   This information is not intended to replace advice given to you by your health care provider. Make sure you discuss any questions you have with your health care provider.   Document Released: 04/12/2001 Document Revised: 04/08/2015 Document Reviewed: 11/10/2014 Elsevier Interactive Patient Education [employer].

## 2016-02-15 NOTE — ED Notes (Signed)
Pt. Left AMA  

## 2016-02-15 NOTE — ED Notes (Signed)
Pt presents with right dislocated shoulder, fell at work and landed on shoulder. Pt with hx of same and just had surgery on same shoulder two mths ago. Limited ROM.

## 2016-02-15 NOTE — ED Notes (Addendum)
Pt became dizzy at work and fell.  Landed on right shoulder.  Shoulder deformity noted.  Just left [facility] due to wait times.  Labs/ekg done at [facility] although blood results not seen.  Recent shoulder surgery.

## 2016-03-04 ENCOUNTER — Emergency Department (HOSPITAL_COMMUNITY): Payer: Medicare Other

## 2016-03-04 ENCOUNTER — Emergency Department (HOSPITAL_COMMUNITY)
Admission: EM | Admit: 2016-03-04 | Discharge: 2016-03-04 | Discharge status: Against Medical Advice | Payer: Medicare Other | Attending: Emergency Medicine | Admitting: Emergency Medicine

## 2016-03-04 ENCOUNTER — Encounter (HOSPITAL_COMMUNITY): Payer: Self-pay | Admitting: Emergency Medicine

## 2016-03-04 DIAGNOSIS — R0789 Other chest pain: Secondary | ICD-10-CM | POA: Insufficient documentation

## 2016-03-04 DIAGNOSIS — R079 Chest pain, unspecified: Secondary | ICD-10-CM | POA: Diagnosis present

## 2016-03-04 DIAGNOSIS — Z87891 Personal history of nicotine dependence: Secondary | ICD-10-CM | POA: Diagnosis not present

## 2016-03-04 DIAGNOSIS — I10 Essential (primary) hypertension: Secondary | ICD-10-CM | POA: Insufficient documentation

## 2016-03-04 DIAGNOSIS — Z7982 Long term (current) use of aspirin: Secondary | ICD-10-CM | POA: Insufficient documentation

## 2016-03-04 LAB — CBC
HCT: 40.7 % (ref 39.0–52.0)
Hemoglobin: 13.8 g/dL (ref 13.0–17.0)
MCH: 31.4 pg (ref 26.0–34.0)
MCHC: 33.9 g/dL (ref 30.0–36.0)
MCV: 92.5 fL (ref 78.0–100.0)
Platelets: 222 K/uL (ref 150–400)
RBC: 4.4 MIL/uL (ref 4.22–5.81)
RDW: 13.7 % (ref 11.5–15.5)
WBC: 11.6 K/uL — ABNORMAL HIGH (ref 4.0–10.5)

## 2016-03-04 LAB — BASIC METABOLIC PANEL WITH GFR
Anion gap: 8 (ref 5–15)
BUN: 11 mg/dL (ref 6–20)
CO2: 23 mmol/L (ref 22–32)
Calcium: 9.3 mg/dL (ref 8.9–10.3)
Chloride: 106 mmol/L (ref 101–111)
Creatinine, Ser: 1.1 mg/dL (ref 0.61–1.24)
GFR calc Af Amer: >60 mL/min
GFR calc non Af Amer: >60 mL/min
Glucose, Bld: 111 mg/dL — ABNORMAL HIGH (ref 65–99)
Potassium: 3.7 mmol/L (ref 3.5–5.1)
Sodium: 137 mmol/L (ref 135–145)

## 2016-03-04 LAB — I-STAT TROPONIN, ED: Troponin i, poc: 0 ng/mL (ref 0.00–0.08)

## 2016-03-04 MED ORDER — NITROGLYCERIN 0.4 MG SL SUBL: 0.4000 mg | SUBLINGUAL_TABLET | SUBLINGUAL | Status: DC | PRN

## 2016-03-04 MED ORDER — ONDANSETRON HCL 4 MG/2ML IJ SOLN
4.0000 mg | Freq: Once | INTRAMUSCULAR | Status: AC
  Administered 2016-03-04: 4 mg via INTRAVENOUS
  Filled 2016-03-04: qty 2

## 2016-03-04 NOTE — ED Notes (Signed)
Pt refused to stay for additional evaluation or treatment. Requested transfer to [facility], which was refused by Gertie Fey. Risks and consequences explained to pt, who expressed understanding. Pt signed AMA and departed in NAD, refused wheelchair.

## 2016-03-04 NOTE — ED Notes (Signed)
Patient transported to X-ray 

## 2016-03-04 NOTE — ED Triage Notes (Addendum)
Pt brought by EMS from [facility]. Per EMS, pt reported sudden onset of L side CP, radiating down L arm and back. ECG shows ST. +Hx of MI with stent placement. Received 324mg  ASA, 1x nitro from EMS. 116/78, 99% 4L, P110. Pt told EMS "felt like my last MI". Pain relief with nitro.   Transported to [facility] 47mo prior for active STEMI.

## 2016-03-04 NOTE — ED Provider Notes (Signed)
[facility] DEPT Provider Note   CSN: [id number] Arrival date & time: 03/04/16  1945  First Provider Contact:  None       History   Chief Complaint Chief Complaint  Patient presents with  . Chest Pain    HPI Alex Sandoval is a 30 y.o. male.  HPI   30 year old male with history of polysubstance abuse, personality disorder, malingering brought here via EMS from [facility] for evaluation of chest pain. Patient report acute onset of left-sided chest pain radiates to his jaw and his left arm which started approximately 3 hours ago while he was eating dinner. Pain initially rated as an 8 out of 10, improves to 6 out of 10 after he took one SL  nitroglycerin and 4 baby aspirin. Pain has now returned to 8 out of 10 has been persistent since. Report feeling lightheadedness, dizziness, diaphoresis and states that it felt similar to the prior MI that he had last month in which she was evaluated at [facility] and subsequent he had a heart catheterization as well as stenting. He report no active chest pain until today. He denies any aggravating or alleviating factor. Denies any fever, chills, shortness of breath, productive cough, hemoptysis, abdominal pain, back pain, focal numbness or weakness. He denies a prior history of PE or DVT, no recent surgery, prolonged bed rest, unilateral leg swelling or calf pain. Patient states he is currently on Plavix. He denies any recent drug or alcohol abuse.  Past Medical History:  Diagnosis Date  . Confabulation   . Drug abuse   . History of pseudoseizure   . Hypertension   . Malingering   . Personality disorder   . Post traumatic stress disorder   . Seizures ([place])   . Shoulder dislocation, recurrent    SELF-INDUCED IN ATTEMPT TO OBTAIN OPIATES    Patient Active Problem List   Diagnosis Date Noted  . Opiate abuse, continuous 01/23/2012    Class: Acute  . Benzodiazepine abuse, continuous 01/23/2012    Class: Acute    Past  Surgical History:  Procedure Laterality Date  . APPENDECTOMY         Home Medications    Prior to Admission medications   Medication Sig Start Date End Date Taking? Authorizing Provider  aspirin 81 MG chewable tablet Chew 81 mg by mouth daily.    Historical Provider, MD  cloNIDine (CATAPRES) 0.1 MG tablet Take 0.1 mg by mouth at bedtime.    Historical Provider, MD  cloNIDine (CATAPRES) 0.1 MG tablet Take 0.1 mg by mouth 2 (two) times daily.    Historical Provider, MD  metoprolol (LOPRESSOR) 50 MG tablet Take 50 mg by mouth 2 (two) times daily.    Historical Provider, MD  naproxen (NAPROSYN) 500 MG tablet Take 1 tablet (500 mg total) by mouth 2 (two) times daily with a meal. 02/15/16   Lavonia Drafts, MD    Family History History reviewed. No pertinent family history.  Social History Social History  Substance Use Topics  . Smoking status: Former Smoker    Packs/day: 0.50    Years: 10.00    Types: Cigarettes    Quit date: 01/30/2016  . Smokeless tobacco: Never Used  . Alcohol use No     Allergies   Benadryl [diphenhydramine hcl]; Penicillins; and Penicillins   Review of Systems Review of Systems  All other systems reviewed and are negative.    Physical Exam Updated Vital Signs BP 146/87   Pulse 102  Temp 98.3 F (36.8 C) (Oral)   Resp 16   Ht 6' (1.829 m)   Wt 81.6 kg   SpO2 98%   BMI 24.41 kg/m   Physical Exam  Constitutional: He appears well-developed and well-nourished. No distress.  HENT:  Head: Atraumatic.  Eyes: Conjunctivae are normal.  Neck: Normal range of motion. Neck supple.  Cardiovascular: Normal rate, regular rhythm and intact distal pulses.   Pulmonary/Chest: Effort normal and breath sounds normal.  Abdominal: Soft. Bowel sounds are normal. There is no tenderness.  Musculoskeletal: He exhibits no edema.  Neurological: He is alert.  Skin: No rash noted.  Psychiatric: He has a normal mood and affect.  Nursing note and vitals  reviewed.    ED Treatments / Results  Labs (all labs ordered are listed, but only abnormal results are displayed) Labs Reviewed  BASIC METABOLIC PANEL - Abnormal; Notable for the following:       Result Value   Glucose, Bld 111 (*)    All other components within normal limits  CBC - Abnormal; Notable for the following:    WBC 11.6 (*)    All other components within normal limits  I-STAT TROPOININ, ED    EKG  EKG Interpretation None     ED ECG REPORT   Date: 03/04/2016  Rate: 108  Rhythm: sinus tachycardia  QRS Axis: normal  Intervals: normal  ST/T Wave abnormalities: early repolarization  Conduction Disutrbances:none  Narrative Interpretation:   Old EKG Reviewed: unchanged  I have personally reviewed the EKG tracing and agree with the computerized printout as noted.   Radiology Dg Chest 2 View  Result Date: 03/04/2016 CLINICAL DATA:  Chest pain for 2 hours with shortness of breath. Left-sided pain with left arm and flank pain. EXAM: CHEST  2 VIEW COMPARISON:  Radiographs 01/15/2016 FINDINGS: The cardiomediastinal contours are normal. The lungs are clear. Pulmonary vasculature is normal. No consolidation, pleural effusion, or pneumothorax. No acute osseous abnormalities are seen. IMPRESSION: No acute pulmonary process. Electronically Signed   By: Alex Sandoval M.D.   On: 03/04/2016 20:49    Procedures Procedures (including critical care time)  Medications Ordered in ED Medications  ondansetron (ZOFRAN) injection 4 mg (4 mg Intravenous Given 03/04/16 2023)     Initial Impression / Assessment and Plan / ED Course  I have reviewed the triage vital signs and the nursing notes.  Pertinent labs & imaging results that were available during my care of the patient were reviewed by me and considered in my medical decision making (see chart for details).  Clinical Course    BP 144/84   Pulse 95   Temp 98.3 F (36.8 C) (Oral)   Resp 19   Ht 6' (1.829 m)   Wt 81.6  kg   SpO2 98%   BMI 24.41 kg/m    Final Clinical Impressions(s) / ED Diagnoses   Final diagnoses:  Other chest pain    New Prescriptions New Prescriptions   No medications on file   9:10 PM Patient here with complaints of chest pain. He reports prior history of MI with stent placement. His examination today is unremarkable and I have low suspicion for ACS. However your his prior records which patient has been seen several times the past with various complaints including chest pain and stroke with extensive workup without any documented pathology. Has history of personality disorder, confabulation, malingering, and drug-seeking behaviors.  Work up today without acute finding.  I did offer to perform a delta  troponin as well as a d-dimer to r/o PE.  Pt request to leave AMA  Date: 03/04/2016 Patient: Alex Sandoval Admitted: 03/04/2016  7:45 PM Attending Provider: Daleen Bo, MD  Melene Muller or his authorized caregiver has made the decision for the patient to leave the emergency department against the advice of Daleen Bo, MD and Domenic Moras, PA-C.  He or his authorized caregiver has been informed and understands the inherent risks, including death.  He or his authorized caregiver has decided to accept the responsibility for this decision. Melene Muller and all necessary parties have been advised that he may return for further evaluation or treatment. His condition at time of discharge was Stable.  Melene Muller had current vital signs as follows:  Blood pressure 144/84, pulse 95, temperature 98.3 F (36.8 C), temperature source Oral, resp. rate 19, height 6' (1.829 m), weight 81.6 kg, SpO2 98 %.   Melene Muller or his authorized caregiver has signed the Leaving Against Medical Advice form prior to leaving the department.  Mancil Pfenning 03/04/2016        Domenic Moras, PA-C 03/04/16 2214    Daleen Bo, MD 03/05/16 [phone]

## 2018-01-25 NOTE — Nursing Note (Signed)
Medication Administration Follow Up-Text       Medication Administration Follow Up Entered On:  01/25/2018 18:59 EDT    Performed On:  01/25/2018 18:59 EDT by Adela Lank, RN, Prudence Davidson      Intervention Information:     ondansetron  Performed by Adela Lank RN, Prudence Davidson on 01/25/2018 18:38:00 EDT       ondansetron,4mg   IV Push,Antecubital, Left       Med Response   ED Medication Response :   No adverse reaction, Symptoms improved   Lamount Cohen - 01/25/2018 18:59 EDT

## 2018-01-25 NOTE — Nursing Note (Signed)
Medication Administration Follow Up-Text       Medication Administration Follow Up Entered On:  01/25/2018 18:59 EDT    Performed On:  01/25/2018 18:59 EDT by Adela Lank, RN, Prudence Davidson      Intervention Information:     morphine  Performed by Adela Lank RN, Prudence Davidson on 01/25/2018 18:38:00 EDT       morphine,4mg   IV Push,Antecubital, Left       Med Response   ED Medication Response :   No adverse reaction, Symptoms improved   Lamount Cohen - 01/25/2018 18:59 EDT

## 2018-01-26 NOTE — Nursing Note (Signed)
Medication Administration Follow Up-Text       Medication Administration Follow Up Entered On:  01/26/2018 22:51 EDT    Performed On:  01/26/2018 22:51 EDT by Knute Neu, RN, Tresa Endo      Intervention Information:     morphine  Performed by Knute Neu, RN, Tresa Endo on 01/26/2018 22:10:00 EDT       morphine,4mg   IV Push,Antecubital, Left       Med Response   ED Medication Response :   No adverse reaction, Symptoms improved   Numeric Rating Pain Scale :   4   Haywood Pao - 01/26/2018 22:51 EDT

## 2018-01-27 NOTE — Nursing Note (Signed)
 Adult Patient History Form-Text       Adult Patient History Entered On:  01/27/2018 0:26 EDT    Performed On:  01/27/2018 0:21 EDT by Felipa Furnace, RN, CRISTINA H               General Info   Patient Identified :   Identification band, Verbal   Patient Identified :   Evan Woods   Information Given By :   Self   Preferred Mode of Communication :   Verbal   Accompanied By :   None   In Charge of News (ICON) Name :   Crystal [phone]   Pregnancy Status :   N/A   Has the patient received chemotherapy or biotherapy within the last 48 hours? :   No   In Clinical Trial With Signed Consent for Related Condition :   No signed consent for clinical trial   Is the patient currently (2-3 days) receiving radiation treatment? :   No   GARCIA, RN, CRISTINA H - 01/27/2018 0:21 EDT   Allergies   (As Of: 01/27/2018 00:26:31 EDT)   Allergies (Active)   penicillin  Estimated Onset Date:   Unspecified ; Reactions:   Anaphylactic reaction ; Created By:   Georgeanne Nim RN, Lockie Pares R; Reaction Status:   Active ; Category:   Drug ; Substance:   penicillin ; Type:   Allergy ; Severity:   Severe ; Updated By:   Fredric Mare; Reviewed Date:   01/26/2018 21:50 EDT        Medication History   Medication List   (As Of: 01/27/2018 00:26:31 EDT)   Normal Order    morphine 4 mg/mL preservative-free Sol 1 mL  :   morphine 4 mg/mL preservative-free Sol 1 mL ; Status:   Completed ; Ordered As Mnemonic:   morphine ; Simple Display Line:   4 mg, 1 mL, IV Push, Once ; Ordering Provider:   Ilda Basset; Catalog Code:   morphine ; Order Dt/Tm:   01/26/2018 21:55:55 EDT            Home Meds    aspirin  :   aspirin ; Status:   Documented ; Ordered As Mnemonic:   Aspir 81 ; Simple Display Line:   81 mg, Oral, Daily, 0 Refill(s) ; Catalog Code:   aspirin ; Order Dt/Tm:   01/26/2018 21:21:00 EDT          clopidogrel  :   clopidogrel ; Status:   Documented ; Ordered As Mnemonic:   Plavix 75 mg oral tablet ; Simple Display Line:   75 mg, 1 tabs, Oral, Daily, 0  Refill(s) ; Catalog Code:   clopidogrel ; Order Dt/Tm:   01/26/2018 21:21:10 EDT ; Comment:   "IF PATIENT ON OMEPRAZOLE, PANTOPRAZOLE   SHOULD BE AUTOSUBSTITUTED DUE TO A   DRUG INTERACTION WITH CLOPIDOGREL.@@  "          atorvastatin  :   atorvastatin ; Status:   Documented ; Ordered As Mnemonic:   Lipitor 40 mg oral tablet ; Simple Display Line:   40 mg, 1 tabs, Oral, Daily, 0 Refill(s) ; Catalog Code:   atorvastatin ; Order Dt/Tm:   01/26/2018 21:20:36 EDT          metoprolol  :   metoprolol ; Status:   Documented ; Ordered As Mnemonic:   metoprolol succinate 25 mg oral capsule, extended release ; Simple Display Line:   25 mg, 1 caps, Oral,  Daily, 0 Refill(s) ; Catalog Code:   metoprolol ; Order Dt/Tm:   01/26/2018 21:20:46 EDT            Problem History   (As Of: 01/27/2018 00:26:31 EDT)   Problems(Active)    Hypercholesteremia (SNOMED CT  :16109604 )  Name of Problem:   Hypercholesteremia ; Recorder:   ROTHERMEL, RN, JACLYN R; Confirmation:   Confirmed ; Classification:   Patient Stated ; Code:   [id number] ; Contributor System:   Dietitian ; Last Updated:   01/25/2018 18:06 EDT ; Life Cycle Date:   01/25/2018 ; Life Cycle Status:   Active ; Vocabulary:   SNOMED CT        Hypertension (SNOMED CT  :1478295621 )  Name of Problem:   Hypertension ; Recorder:   ROTHERMEL, RN, JACLYN R; Confirmation:   Confirmed ; Classification:   Patient Stated ; Code:   [id number] ; Contributor System:   Dietitian ; Last Updated:   01/25/2018 18:05 EDT ; Life Cycle Date:   01/25/2018 ; Life Cycle Status:   Active ; Vocabulary:   SNOMED CT        Leukemia (SNOMED CT  :629528413 )  Name of Problem:   Leukemia ; Recorder:   ROTHERMEL, RN, JACLYN R; Confirmation:   Confirmed ; Classification:   Patient Stated ; Code:   244010272 ; Contributor System:   PowerChart ; Last Updated:   01/25/2018 18:05 EDT ; Life Cycle Date:   01/25/2018 ; Life Cycle Status:   Active ; Vocabulary:   SNOMED CT        Myocardial infarct (SNOMED CT  :53664403 )  Name  of Problem:   Myocardial infarct ; Recorder:   ROTHERMEL, RN, JACLYN R; Confirmation:   Confirmed ; Classification:   Patient Stated ; Code:   [id number] ; Contributor System:   PowerChart ; Last Updated:   01/25/2018 18:06 EDT ; Life Cycle Date:   01/25/2018 ; Life Cycle Status:   Active ; Vocabulary:   SNOMED CT        Shoulder dislocation (SNOMED CT  :387564332 )  Name of Problem:   Shoulder dislocation ; Recorder:   ROTHERMEL, RN, JACLYN R; Confirmation:   Confirmed ; Classification:   Patient Stated ; Code:   951884166 ; Contributor System:   PowerChart ; Last Updated:   01/25/2018 18:06 EDT ; Life Cycle Date:   01/25/2018 ; Life Cycle Status:   Active ; Vocabulary:   SNOMED CT        Stented coronary artery (SNOMED CT  :0630160109 )  Name of Problem:   Stented coronary artery ; Recorder:   ROTHERMEL, RN, JACLYN R; Confirmation:   Confirmed ; Classification:   Patient Stated ; Code:   [id number] ; Contributor System:   PowerChart ; Last Updated:   01/25/2018 18:05 EDT ; Life Cycle Date:   01/25/2018 ; Life Cycle Status:   Active ; Vocabulary:   SNOMED CT          Diagnoses(Active)    Acute cerebrovascular accident (CVA)  Date:   01/26/2018 ; Diagnosis Type:   Discharge ; Confirmation:   Confirmed ; Clinical Dx:   Acute cerebrovascular accident (CVA) ; Classification:   Medical ; Clinical Service:   Non-Specified ; Code:   ICD-10-CM ; Probability:   0 ; Diagnosis Code:   I63.9      Focal motor weakness  Date:   01/26/2018 ; Diagnosis Type:   Reason For Visit ; Confirmation:  Confirmed ; Clinical Dx:   Focal motor weakness ; Classification:   Medical ; Clinical Service:   Emergency medicine ; Code:   PNED ; Probability:   0 ; Diagnosis Code:   [id number]      Hip pain-swelling  Date:   01/26/2018 ; Diagnosis Type:   Reason For Visit ; Confirmation:   Complaint of ; Clinical Dx:   Hip pain-swelling ; Classification:   Medical ; Clinical Service:   Non-Specified ; Code:   PNED ; Probability:   0 ;  Diagnosis Code:   [id number]      History of myocardial infarction  Date:   01/26/2018 ; Diagnosis Type:   Discharge ; Confirmation:   Confirmed ; Clinical Dx:   History of myocardial infarction ; Classification:   Medical ; Clinical Service:   Non-Specified ; Code:   ICD-10-CM ; Probability:   0 ; Diagnosis Code:   I25.2      Hyperlipidemia  Date:   01/26/2018 ; Diagnosis Type:   Discharge ; Confirmation:   Confirmed ; Clinical Dx:   Hyperlipidemia ; Classification:   Medical ; Clinical Service:   Non-Specified ; Code:   ICD-10-CM ; Probability:   0 ; Diagnosis Code:   E78.5      Leukemia  Date:   01/26/2018 ; Diagnosis Type:   Discharge ; Confirmation:   Confirmed ; Clinical Dx:   Leukemia ; Classification:   Medical ; Clinical Service:   Non-Specified ; Code:   ICD-10-CM ; Probability:   0 ; Diagnosis Code:   C95.90      Medical screening exam  Date:   01/26/2018 ; Diagnosis Type:   Reason For Visit ; Confirmation:   Complaint of ; Clinical Dx:   Medical screening exam ; Classification:   Medical ; Clinical Service:   Non-Specified ; Code:   PNED ; Probability:   0 ; Diagnosis Code:   [id number]        Procedure History        -    Procedure History   (As Of: 01/27/2018 00:26:32 EDT)     Anesthesia Minutes:   0 ; Procedure Name:   None ; Procedure Minutes:   0            Anesthesia Minutes:   0 ; Procedure Name:   stent ; Procedure Minutes:   0 ; Comments:     01/27/2018 0:24 EDT - Felipa Furnace, RN, CRISTINA H  x2 ; Last Reviewed Dt/Tm:   01/27/2018 00:24:02 EDT            Procedure Dt/Tm:   12/12/2017 ; Anesthesia Minutes:   0 ; Procedure Name:   stent ; Procedure Minutes:   0 ; Last Reviewed Dt/Tm:   01/27/2018 00:23:44 EDT            Anesthesia Minutes:   0 ; Procedure Name:   Appendectomy; ; Procedure Minutes:   0 ; Last Reviewed Dt/Tm:   01/27/2018 00:23:02 EDT            Immunizations   Last Tetanus :   Less than 5 years   GARCIA, RN, CRISTINA H - 01/27/2018 0:21 EDT   ID  Risk Screen Symptoms   Recent Travel History :   No recent travel   TB Symptom Screen :   No symptoms   C. diff Symptom/History ID :   Neither of the above   Patient Pregnant :   None of the above  MRSA/VRE Screening :   Admitted to an Critical Care or BMT   CRE Screening :   Not applicable   Felipa Furnace, RN, CRISTINA H - 01/27/2018 0:21 EDT   Bloodless Medicine   Will Patient Accept Blood Transfusion and/or Blood Products :   Yes   GARCIA, RN, CRISTINA H - 01/27/2018 0:21 EDT   Nutrition   Nutritional Risk Factors :   Constipation   Unintentional Weight Change Greater Than 10 lbs in the Last 6 Months :   Yes   GARCIA, RN, CRISTINA H - 01/27/2018 0:21 EDT   Functional   Sensory Deficits :   None   ADLs Prior to Admission :   Independent   GARCIA, RN, CRISTINA H - 01/27/2018 0:21 EDT   Social History   Social History   (As Of: 01/27/2018 00:26:32 EDT)   Tobacco:        Tobacco use: 4 or less cigarettes(less than 1/4 pack)/day in last 30 days.  Cigarettes, 15 year(s).   (Last Updated: 01/27/2018 00:25:22 EDT by Felipa Furnace, RN, CRISTINA H)          Alcohol:        Denies   (Last Updated: 01/27/2018 00:25:28 EDT by Felipa Furnace, RN, CRISTINA H)          Substance Abuse:        Denies   (Last Updated: 01/27/2018 00:25:33 EDT by Felipa Furnace, RN, CRISTINA H)            Spiritual   Do you have a concern that you would like to address with a Chaplain? :   No   GARCIA, RN, CRISTINA H - 01/27/2018 0:21 EDT   Harm Screen   Feels Unsafe at Home :   No   Suicidal Ideation :   None   GARCIA, RN, CRISTINA H - 01/27/2018 0:21 EDT   Advance Directive   Location of Advance Directive :   Unable to obtain copy   Advance Directive :   No   Felipa Furnace RN, Kipp Brood - 01/27/2018 0:21 EDT   Education   Primary Language :   Burman Foster, RN, CRISTINA H - 01/27/2018 0:21 EDT   Caregiver/Advocate Language   Patient :   Programme researcher, broadcasting/film/video, Verbal explanation   GARCIA, RN, CRISTINA H - 01/27/2018 0:21 EDT   Barriers to Learning :   None evident   GARCIA, RN, CRISTINA H -  01/27/2018 0:21 EDT   Preventative Measures Information   Unit/Room Orientation :   Verbalizes understanding   Environmental Safety :   Verbalizes understanding   Hand Washing :   Verbalizes understanding   Infection Prevention :   Verbalizes understanding   DVT Prophylaxis :   Verbalizes understanding   Isolation Precaution :   Verbalizes understanding   GARCIA, RN, CRISTINA H - 01/27/2018 0:21 EDT   DC Needs   Living Situation :   Home with family support   Felipa Furnace, RNKipp Brood - 01/27/2018 0:21 EDT   Valuables and Belongings   Valuables and Belongings   At Bedside :   Clothes   GARCIA, RN, CRISTINA H - 01/27/2018 0:21 EDT   Admission Complete   Admission Complete :   Yes-unable to obtain further information   Felipa Furnace RN, CRISTINA H - 01/27/2018 0:21 EDT

## 2018-01-27 NOTE — Nursing Note (Signed)
Medication Administration Follow Up-Text       Medication Administration Follow Up Entered On:  01/27/2018 1:58 EDT    Performed On:  01/27/2018 1:57 EDT by Felipa Furnace, RN, CRISTINA H      Intervention Information:     morphine  Performed by Felipa Furnace, RN, CRISTINA H on 01/27/2018 01:00:00 EDT       morphine,2mg   IV Push,Arm, Upper Right,severe pain (8-10)       Med Response   ED Medication Response :   Symptoms improved, Continue to observe for symptoms   NVPS Section :   Document assessment   [place] Opioid Induced Sedation Scale :   2 = Slightly drowsy, easily aroused   GARCIA, RN, CRISTINA H - 01/27/2018 1:57 EDT   NVPS   FACE-NVPS :   No particular expression or smile   Activity-NVPS :   Lying quietly, normal position   Guarding-NVPS :   Lying quietly, no positioning of hands over areas of body   Physiology-NVPS :   Stable vital signs   Respiratory-NVPS :   Baseline RR/SpO2, vent compliant   NVPS Score :   0    GARCIA, RN, CRISTINA H - 01/27/2018 1:57 EDT

## 2018-01-27 NOTE — Nursing Note (Signed)
Medication Administration Follow Up-Text       Medication Administration Follow Up Entered On:  01/27/2018 10:52 EDT    Performed On:  01/27/2018 10:52 EDT by Dorothe Pea, RN, Ernestene Kiel      Intervention Information:     ondansetron  Performed by Dorothe Pea RN, Ernestene Kiel on 01/27/2018 09:34:00 EDT       ondansetron,4mg   IV Push,Arm, Upper Right,nausea/vomiting       Med Response   ED Medication Response :   No adverse reaction   Alton Revere - 01/27/2018 10:52 EDT

## 2018-01-27 NOTE — Case Communication (Signed)
CM Discharge Planning Assessment - Text       CM Discharge Planning Ongoing Assessment Entered On:  01/27/2018 14:38 EDT    Performed On:  01/27/2018 14:37 EDT by Otelia Santee R               Discharge Needs I   Previously Documented Discharge Needs :   DISCHARGE PLAN/NEEDS:  Discharge To, Anticipated: Home independently, Acute Rehab - Otelia Santee R - 01/27/18 11:35:00  EQUIPMENT/TREATMENT NEEDS:  Professional Skilled Services: Occupational Therapy, Physical Therapy, Speech/Language Pathology - Otelia Santee R - 01/27/18 11:35:00       Previously Documented Benefits Information :   Performed By: Nelda Marseille  - 01/27/18 11:35:00       Anticipated Discharge Date :   01/27/2018 EDT   Anticipated Discharge Time Slot :   1200-1400   Discharge To :   Home independently   Home Caregiver Name/Relationship CM :   Tavien Ricardez (wife):  [phone]   CM Progress Note :   01/27/18  CRS:  CM met with pt to complete initial assessment.  Pt was admitted for CVA.  PT/OT/SLP and [facility] consult are pending.  Pt is from [place].  He has provided an address of [address], [place], [place] [address].  Pt is married, but he and his wife do not live together.  Pt lives alone.  He is independent at home.  He does not use any DME.  He reports that his PCP is Dr. Gretta Cool and he fills his prescriptions at [facility].  Pt reported that he has Medicare and [employer], although there is no insurance on file for him.  Pt stated that he does not have his insurance card.  Pt reports that he does have a Living Will.  Pt is receptive to going to [facility], if needed.  CM will continue to follow.  ADDENDUM:  Pt has left AMA.  Case closed in [place].     Nelda Marseille - 01/27/2018 14:37 EDT   Discharge Needs II   DischargDischarge Device/Equipment CMe Device/Equipment CM :   None   Professional Skilled Services :   No Needs   Needs Assistance with Transportation :   No   Discharge  Transportation Arranged :   N/A   Needs Assistance at Home Upon Discharge :   No   Requires Caregiver Involvement :   No   Discharge Planning Time Spent :   15 minutes   Otelia Santee R - 01/27/2018 14:37 EDT   Benefits   Insurance Information :   Other: Needs to be verified   Nelda Marseille - 01/27/2018 14:37 EDT   Discharge Planning   Discharge Arrangements :       Patient Post-Acute Information    Patient Name: GARWOOD, RUMBLEY  MRN: [id number]  FIN: [id number]  Gender: Male  DOB: [birth year]  Age:  32 Years        [facility] Post-Acute Placement(s):    Business Name: Business Address: Phone Number:      Unspecified or not discharged              *** No Post-Acute Service(s) Listed ***       Patient Choices Made :   Pt left AMA     Discharge Options Discussed with Patient :   Other: Acute rehab   Interventions Performed :   All resolved   Discharge Plan Discussion :  Discussed with patient, Patient agrees with plan   Barriers to Discharge Identified :   None identified   Nelda Marseille - 01/27/2018 14:37 EDT   Discharge Planning Assessment Status   Discharge Planning Assessment Complete :   Yes   Otelia Santee R - 01/27/2018 14:37 EDT
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
## 2018-01-27 NOTE — Nursing Note (Signed)
Medication Administration Follow Up-Text       Medication Administration Follow Up Entered On:  01/27/2018 9:38 EDT    Performed On:  01/27/2018 7:55 EDT by Dorothe Pea, RN, Ernestene Kiel      Intervention Information:     morphine  Performed by Dorothe Pea RN, Ernestene Kiel on 01/27/2018 07:40:00 EDT       morphine,2mg   IV Push,Arm, Upper Right,severe pain (8-10)       Med Response   ED Medication Response :   No adverse reaction   Numeric Rating Pain Scale :   2   Alton Revere - 01/27/2018 9:38 EDT

## 2018-01-27 NOTE — Nursing Note (Signed)
Medication Administration Follow Up-Text       Medication Administration Follow Up Entered On:  01/27/2018 1:58 EDT    Performed On:  01/27/2018 1:57 EDT by Felipa Furnace, RN, CRISTINA H      Intervention Information:     ondansetron  Performed by Felipa Furnace, RN, CRISTINA H on 01/27/2018 01:00:00 EDT       ondansetron,4mg   IV Push,Arm, Upper Right,nausea/vomiting       Med Response   ED Medication Response :   Symptoms improved   GARCIA, RN, CRISTINA H - 01/27/2018 1:57 EDT

## 2018-01-27 NOTE — Case Communication (Signed)
CM Discharge Planning Assessment - Text       CM Discharge Planning Assessment Entered On:  01/27/2018 11:37 EDT    Performed On:  01/27/2018 11:35 EDT by Otelia Santee R               Home Environment   Key Contact Information :   PCP - Dr. Gretta Cool   Outside Facility Information :   Rx - [facility]   Barriers at Home :   None   SPRINGER,  COURTNEY R - 01/27/2018 11:39 EDT   Recent Hospitalization Grid     Hospitalization #1          Date :    01/26/18              Reason :    Left [facility] AMA                Nelda Marseille - 01/27/2018 11:39 EDT         Living Environment :   No Living Environment Information Available     Nelda Marseille - 01/27/2018 11:41 EDT   Affect/Behavior :   Appropriate, Calm, Cooperative   Otelia Santee R - 01/27/2018 11:39 EDT   Lives With :   Alone   Living Situation :   Home independently   Nelda Marseille - 01/27/2018 11:35 EDT   Home Environment   Home Equipment Rehab :   None   ADLs Prior to Admission :   Independent   Sensory Deficits :   None   SPRINGER,  COURTNEY R - 01/27/2018 11:41 EDT   Discharge Needs I   Previously Documented Discharge Needs :   DISCHARGE PLAN/NEEDS:  EQUIPMENT/TREATMENT NEEDS:       Previously Documented Benefits Information :   No discharge data available.     Discharge To :   Home independently, Acute Rehab   Home Caregiver Name/Relationship CM :   Branford Alvira (wife):  [phone]   CM Progress Note :   01/27/18  CRS:  CM met with pt to complete initial assessment.  Pt was admitted for CVA.  PT/OT/SLP and [facility] consult are pending.  Pt is from [place].  He ha provided an address of [address], [place], [place] [address].  Pt is married, but he and his wife do not live together.  Pt lives alone.  He is independent at home.  He does not use any DME.  He reports that his PCP is Dr. Gretta Cool and he fills his prescriptions at [facility].  Pt reported that he has Medicare and [employer], although there is  no insurance on file for him.  Pt stated that he does not have his insurance card.  Pt reports taht he does have a Living Will.  Pt is receptive to going to [facility], if needed.  CM will continue to follow.     Nelda Marseille - 01/27/2018 11:41 EDT   Discharge Needs II   Professional Skilled Services :   Occupational Therapy, Physical Therapy, Speech/Language Pathology   Discharge Planning Time Spent :   30 minutes   Otelia Santee R - 01/27/2018 11:41 EDT   Benefits   Insurance Information :   Other: Needs to be verified   Nelda Marseille - 01/27/2018 11:41 EDT   Advance Directive   Advance Directive :   Yes   Type of Advance Directive :  Living will   Medical Decision Maker :   Patient/Self   Nelda Marseille - 01/27/2018 11:41 EDT   Discharge Planning   Discharge Arrangements :       Patient Post-Acute Information    Patient Name: Evan Woods, Evan Woods  MRN: [id number]  FIN: [id number]  Gender: Male  DOB: December 30, 1985  Age:  32 Years        *** No Post-Acute Placement(s) Listed ***          *** No Post-Acute Service(s) Listed ***       Discharge Options Discussed with Patient :   Other: Acute rehab   Discharge Plan Discussion :   Discussed with patient, Patient agrees with plan   Barriers to Discharge Identified :   None identified   Nelda Marseille - 01/27/2018 11:41 EDT   Discharge Planning Assessment Status   Discharge Planning Assessment Complete :   Yes   SPRINGER,  COURTNEY R - 01/27/2018 11:41 EDT

## 2018-01-29 NOTE — Nursing Note (Signed)
Medication Administration Follow Up-Text       Medication Administration Follow Up Entered On:  01/29/2018 4:05 EDT    Performed On:  01/29/2018 4:00 EDT by Winnifred Friar, RN, Shelly A      Intervention Information:     morphine  Performed by Winnifred Friar, RN, Shelly A on 01/29/2018 03:01:00 EDT       morphine,4mg  + Sodium Chloride 0.9%,80mL  IV Push,Forearm, Mid Left       Med Response   ED Medication Response :   No adverse reaction, Symptoms improved   Numeric Rating Pain Scale :   6   Pasero Opioid Induced Sedation Scale :   1 = Awake and alert   Respiratory Rate :   16 br/min   Cawley, RN, Shelly A - 01/29/2018 4:05 EDT

## 2018-01-29 NOTE — Nursing Note (Signed)
Medication Administration Follow Up-Text       Medication Administration Follow Up Entered On:  01/29/2018 4:06 EDT    Performed On:  01/29/2018 4:00 EDT by Winnifred Friar, RN, Shelly A      Intervention Information:     ondansetron  Performed by Winnifred Friar, RN, Shelly A on 01/29/2018 03:01:00 EDT       ondansetron,4mg   IV Push,Forearm, Mid Left       Med Response   ED Medication Response :   No adverse reaction, Symptoms improved   Winnifred Friar, RN, Burnett Harry A - 01/29/2018 4:05 EDT

## 2018-02-18 ENCOUNTER — Inpatient Hospital Stay
Admit: 2018-02-18 | Discharge: 2018-02-18 | Disposition: A | Discharge status: Home / Self Care | Payer: MEDICARE | Attending: Emergency Medicine

## 2018-02-18 ENCOUNTER — Emergency Department: Admit: 2018-02-18 | Payer: MEDICARE | Primary: Ophthalmology

## 2018-02-18 DIAGNOSIS — S43004A Unspecified dislocation of right shoulder joint, initial encounter: Secondary | ICD-10-CM

## 2018-02-18 LAB — CBC WITH AUTOMATED DIFF
ABS. BASOPHILS: 0 10*3/uL (ref 0.0–0.1)
ABS. EOSINOPHILS: 0.1 10*3/uL (ref 0.0–0.4)
ABS. IMM. GRANS.: 0 10*3/uL
ABS. LYMPHOCYTES: 1.3 10*3/uL (ref 0.8–3.5)
ABS. MONOCYTES: 0.4 10*3/uL (ref 0.0–1.0)
ABS. NEUTROPHILS: 2.2 10*3/uL (ref 1.8–8.0)
ABSOLUTE NRBC: 0 10*3/uL (ref 0.00–0.01)
BASOPHILS: 0 % (ref 0–1)
EOSINOPHILS: 2 % (ref 0–7)
HCT: 37.3 % (ref 36.6–50.3)
HGB: 12 g/dL — ABNORMAL LOW (ref 12.1–17.0)
IMMATURE GRANULOCYTES: 0 %
LYMPHOCYTES: 32 % (ref 12–49)
MCH: 30.8 PG (ref 26.0–34.0)
MCHC: 32.2 g/dL (ref 30.0–36.5)
MCV: 95.9 FL (ref 80.0–99.0)
MONOCYTES: 10 % (ref 5–13)
MPV: 10.6 FL (ref 8.9–12.9)
NEUTROPHILS: 56 % (ref 32–75)
NRBC: 0 PER 100 WBC
PLATELET: 201 10*3/uL (ref 150–400)
RBC: 3.89 M/uL — ABNORMAL LOW (ref 4.10–5.70)
RDW: 14.9 % — ABNORMAL HIGH (ref 11.5–14.5)
WBC: 4 10*3/uL — ABNORMAL LOW (ref 4.1–11.1)

## 2018-02-18 LAB — METABOLIC PANEL, COMPREHENSIVE
A-G Ratio: 1.1 (ref 1.1–2.2)
ALT (SGPT): 38 U/L (ref 12–78)
AST (SGOT): 18 U/L (ref 15–37)
Albumin: 3.9 g/dL (ref 3.5–5.0)
Alk. phosphatase: 81 U/L (ref 45–117)
Anion gap: 7 mmol/L (ref 5–15)
BUN/Creatinine ratio: 15 (ref 12–20)
BUN: 15 MG/DL (ref 6–20)
Bilirubin, total: 0.2 MG/DL (ref 0.2–1.0)
CO2: 26 mmol/L (ref 21–32)
Calcium: 8.8 MG/DL (ref 8.5–10.1)
Chloride: 107 mmol/L (ref 97–108)
Creatinine: 1.02 MG/DL (ref 0.70–1.30)
GFR est AA: >60 mL/min/{1.73_m2} (ref >60)
GFR est non-AA: >60 mL/min/{1.73_m2} (ref >60)
Globulin: 3.4 g/dL (ref 2.0–4.0)
Glucose: 94 mg/dL (ref 65–100)
Potassium: 3.9 mmol/L (ref 3.5–5.1)
Protein, total: 7.3 g/dL (ref 6.4–8.2)
Sodium: 140 mmol/L (ref 136–145)

## 2018-02-18 LAB — D DIMER: D-dimer: 0.47 mg/L FEU (ref 0.00–0.65)

## 2018-02-18 LAB — POC TROPONIN-I
POC Troponin I: <0.04 ng/mL (ref 0.00–0.08)
Troponin-I (POC): <0.04 ng/mL (ref 0.00–0.08)

## 2018-02-18 LAB — EKG, 12 LEAD, INITIAL
Atrial Rate: 75 {beats}/min
Calculated P Axis: -7 degrees
Calculated R Axis: 43 degrees
Calculated T Axis: 18 degrees
Diagnosis: NORMAL
P-R Interval: 170 ms
Q-T Interval: 360 ms
QRS Duration: 110 ms
QTC Calculation (Bezet): 402 ms
Ventricular Rate: 75 {beats}/min

## 2018-02-18 LAB — PROTHROMBIN TIME + INR
INR: 1 (ref 0.9–1.1)
Prothrombin time: 10.1 s (ref 9.0–11.1)

## 2018-02-18 LAB — TROPONIN I: Troponin-I, Qt.: <0.05 ng/mL (ref <0.05)

## 2018-02-18 LAB — SAMPLES BEING HELD

## 2018-02-18 LAB — NT-PRO BNP: NT pro-BNP: 47 PG/ML (ref <125)

## 2018-02-18 LAB — CBC WITH AUTO DIFFERENTIAL
Basophils %: 0 % (ref 0–1)
Basophils Absolute: 0 10*3/uL (ref 0.0–0.1)
Eosinophils %: 2 % (ref 0–7)
Eosinophils Absolute: 0.1 10*3/uL (ref 0.0–0.4)
Granulocyte Absolute Count: 0 10*3/uL
Hematocrit: 37.3 % (ref 36.6–50.3)
Hemoglobin: 12 g/dL — ABNORMAL LOW (ref 12.1–17.0)
Immature Granulocytes: 0 %
Lymphocytes %: 32 % (ref 12–49)
Lymphocytes Absolute: 1.3 10*3/uL (ref 0.8–3.5)
MCH: 30.8 PG (ref 26.0–34.0)
MCHC: 32.2 g/dL (ref 30.0–36.5)
MCV: 95.9 FL (ref 80.0–99.0)
MPV: 10.6 FL (ref 8.9–12.9)
Monocytes %: 10 % (ref 5–13)
Monocytes Absolute: 0.4 10*3/uL (ref 0.0–1.0)
NRBC Absolute: 0 10*3/uL (ref 0.00–0.01)
Neutrophils %: 56 % (ref 32–75)
Neutrophils Absolute: 2.2 10*3/uL (ref 1.8–8.0)
Nucleated RBCs: 0 PER 100 WBC
Platelets: 201 10*3/uL (ref 150–400)
RBC: 3.89 M/uL — ABNORMAL LOW (ref 4.10–5.70)
RDW: 14.9 % — ABNORMAL HIGH (ref 11.5–14.5)
WBC: 4 10*3/uL — ABNORMAL LOW (ref 4.1–11.1)

## 2018-02-18 LAB — COMPREHENSIVE METABOLIC PANEL
ALT: 38 U/L (ref 12–78)
AST: 18 U/L (ref 15–37)
Albumin/Globulin Ratio: 1.1 (ref 1.1–2.2)
Albumin: 3.9 g/dL (ref 3.5–5.0)
Alkaline Phosphatase: 81 U/L (ref 45–117)
Anion Gap: 7 mmol/L (ref 5–15)
BUN: 15 MG/DL (ref 6–20)
Bun/Cre Ratio: 15 (ref 12–20)
CO2: 26 mmol/L (ref 21–32)
Calcium: 8.8 MG/DL (ref 8.5–10.1)
Chloride: 107 mmol/L (ref 97–108)
Creatinine: 1.02 MG/DL (ref 0.70–1.30)
EGFR IF NonAfrican American: >60 mL/min/{1.73_m2} (ref >60)
GFR African American: >60 mL/min/{1.73_m2} (ref >60)
Globulin: 3.4 g/dL (ref 2.0–4.0)
Glucose: 94 mg/dL (ref 65–100)
Potassium: 3.9 mmol/L (ref 3.5–5.1)
Sodium: 140 mmol/L (ref 136–145)
Total Bilirubin: 0.2 MG/DL (ref 0.2–1.0)
Total Protein: 7.3 g/dL (ref 6.4–8.2)

## 2018-02-18 LAB — EKG 12-LEAD
Atrial Rate: 75 {beats}/min
Diagnosis: NORMAL
P Axis: -7 degrees
P-R Interval: 170 ms
Q-T Interval: 360 ms
QRS Duration: 110 ms
QTc Calculation (Bazett): 402 ms
R Axis: 43 degrees
T Axis: 18 degrees
Ventricular Rate: 75 {beats}/min

## 2018-02-18 LAB — TROPONIN: Troponin I: <0.05 ng/mL (ref <0.05)

## 2018-02-18 LAB — PROBNP, N-TERMINAL: BNP: 47 PG/ML (ref <125)

## 2018-02-18 LAB — PROTIME-INR
INR: 1 (ref 0.9–1.1)
Protime: 10.1 s (ref 9.0–11.1)

## 2018-02-18 LAB — D-DIMER, QUANTITATIVE: D-Dimer, Quant: 0.47 mg/L FEU (ref 0.00–0.65)

## 2018-02-18 MED ORDER — LIDOCAINE (PF) 10 MG/ML (1 %) IJ SOLN
10 mg/mL (1 %) | Freq: Once | INTRAMUSCULAR | Status: DC
Start: 2018-02-18 — End: 2018-02-18

## 2018-02-18 MED ORDER — LIDOCAINE (PF) 10 MG/ML (1 %) IJ SOLN
10 mg/mL (1 %) | INTRAMUSCULAR | Status: DC
Start: 2018-02-18 — End: 2018-02-18
  Administered 2018-02-18: 06:00:00 via SUBCUTANEOUS

## 2018-02-18 MED ORDER — ASPIRIN 325 MG TAB
325 mg | ORAL | Status: AC
Start: 2018-02-18 — End: 2018-02-18
  Administered 2018-02-18: 05:00:00 via ORAL

## 2018-02-18 MED ORDER — MORPHINE 2 MG/ML INJECTION
2 mg/mL | Freq: Once | INTRAMUSCULAR | Status: AC
Start: 2018-02-18 — End: 2018-02-18
  Administered 2018-02-18: 05:00:00 via INTRAVENOUS

## 2018-02-18 MED ORDER — MORPHINE 2 MG/ML INJECTION
2 mg/mL | Freq: Once | INTRAMUSCULAR | Status: AC
Start: 2018-02-18 — End: 2018-02-18
  Administered 2018-02-18: 08:00:00 via INTRAVENOUS

## 2018-02-18 MED ORDER — NITROGLYCERIN 0.4 MG SUBLINGUAL TAB
0.4 mg | SUBLINGUAL | Status: AC
Start: 2018-02-18 — End: 2018-02-18
  Administered 2018-02-18: 06:00:00 via SUBLINGUAL

## 2018-02-18 MED ORDER — LIDOCAINE HCL 1 % (10 MG/ML) IJ SOLN
10 mg/mL (1 %) | Freq: Once | INTRAMUSCULAR | Status: DC
Start: 2018-02-18 — End: 2018-02-18
  Administered 2018-02-18: 05:00:00 via SUBCUTANEOUS

## 2018-02-18 MED ORDER — ONDANSETRON (PF) 4 MG/2 ML INJECTION
4 mg/2 mL | INTRAMUSCULAR | Status: AC
Start: 2018-02-18 — End: 2018-02-18
  Administered 2018-02-18: 09:00:00 via INTRAVENOUS

## 2018-02-18 MED FILL — MORPHINE 2 MG/ML INJECTION: 2 mg/mL | INTRAMUSCULAR | Qty: 2

## 2018-02-18 MED FILL — NITROGLYCERIN 0.4 MG SUBLINGUAL TAB: 0.4 mg | SUBLINGUAL | Qty: 1

## 2018-02-18 MED FILL — LIDOCAINE HCL 1 % (10 MG/ML) IJ SOLN: 10 mg/mL (1 %) | INTRAMUSCULAR | Qty: 20

## 2018-02-18 MED FILL — LIDOCAINE (PF) 10 MG/ML (1 %) IJ SOLN: 10 mg/mL (1 %) | INTRAMUSCULAR | Qty: 30

## 2018-02-18 MED FILL — ONDANSETRON (PF) 4 MG/2 ML INJECTION: 4 mg/2 mL | INTRAMUSCULAR | Qty: 2

## 2018-02-18 MED FILL — ASPIRIN 325 MG TAB: 325 mg | ORAL | Qty: 1

## 2018-02-18 NOTE — Consults (Signed)
ORTHO CONSULT NOTE    Date of Consultation:  February 18, 2018  Referring Physician:  Madelin Rearillon  CC: right shoulder pain    HPI:  Evan Woods is a 32 y.o. right hand dom male who c/o right shoulder pain s/p falling backwards onto outstretched arm earlier today.  Patient has extensive h/o right shoulder dislocations and required labral [facility] 2016 with 8 subsequent dislocations which he can reduce on his own.  Some reductions in ER and a few in OR.  Normally traction counter-traction procedure is successful.  Sometimes requires prone with weight.  He denies ability to self dislocate.  Tonight, ER MD attempted reduction which seemed successful with improved pain but xrays (without axillary) concerning for persistent dislocation.  Denies numbness/tingling.  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    Upon chart review, multiple ER visits at outside hospitals for reported right shoulder dislocation but axillary views document located shoulder.  He was seen 3 times in May for this complaint without documented dislocation.      Past Medical History:   Diagnosis Date   ??? Acute MI (HCC)    ??? Hypercholesteremia    ??? Hypertension    ??? Leukemia (HCC)    ??? Pulmonary embolism (HCC) 2018      Past Surgical History:   Procedure Laterality Date   ??? HX APPENDECTOMY     ??? HX BONE MARROW TRANSPLANT  2017   ??? HX CORONARY STENT PLACEMENT      2019, 2015 x 2   ??? HX ORTHOPAEDIC      shoulder surgery      History reviewed. No pertinent family history.   Social History     Tobacco Use   ??? Smoking status: Current Every Day Smoker     Packs/day: 0.25   ??? Smokeless tobacco: Never Used   Substance Use Topics   ??? Alcohol use: Never     Frequency: Never        Allergies   Allergen Reactions   ??? Pcn [Penicillins] Anaphylaxis        Review of Systems:  Per HPI.    Objective:     Patient Vitals for the past 8 hrs:   BP Temp Pulse Resp SpO2   02/18/18 0217 130/82 ??? 79 ??? ???   02/18/18 0100 144/79 ??? 75 23 97 %   02/18/18 0039 141/87  97.8 ??F (36.6 ??C) 79 17 98 %     Temp (24hrs), Avg:97.8 ??F (36.6 ??C), Min:97.8 ??F (36.6 ??C), Max:97.8 ??F (36.6 ??C)      EXAM:   NAD. Sleeping but I can awaken. Rates pain 8/10. Appears comfortable.  Moves left UE OK. SILT. Palp pulses.    Right shoulder no obvious deformity. Skin intact.  No obvious effusion.  He can forward flex and abduct shoulder actively.  Wrist/digits nl AROM.  SILT right forearm and upper arm.  Palp radial pulse.       Imaging Review:   Results from Hospital Encounter encounter on 02/18/18   XR SHOULDER RT AP/LAT MIN 2 V    Narrative EXAM: XR SHOULDER RT AP/LAT MIN 2 V    INDICATION: s/p first reduction attempt    COMPARISON: Prereduction.    FINDINGS: Three views of the right shoulder demonstrate persistent anterior  medial dislocation.      Impression IMPRESSION: Persistent anterior medial right shoulder dislocation     Right shoulder Axillary 02/18/18 04:19  IMPRESSION: Successful right shoulder  reduction      Impression:   There are no active problems to display for this patient.    Active Problems:    * No active hospital problems. *    Axillary view proves right shoulder located normally.    Plan:   Given right shoulder is currently located, OK to discharge from Ortho perspective.  Sling.  I would be reluctant to give him additional narcotics for current ortho event given details of chart review revealing recurrent presentation for dislocation but not proven with imaging.    F/U with his Orthopedist in [place] (or if patient staying in [place], [facility]) this week.      Lianne Cure, PA  Orthopedic Trauma Service  [facility]

## 2018-02-18 NOTE — ED Notes (Signed)
Pt arrives ambulatory via EMS from the [facility] station got dizzy with chest tightness, fell and stuck his arm out to break the fall. Obvious deformity noted to R shoulder. Pt states he is currently having CP, SOB, nausea.    Pt states he's had several dislocations, 2 surgeries.    Pt has had 3 MIs, had 3 stents, PE, HTN. Currently on Plavix, aspirin, metoprolol, lipitor.

## 2018-02-18 NOTE — ED Provider Notes (Signed)
32 y.o. male with past medical history significant for Hypertension, Hypercholesterolemia, and Leukemia (remission a year and a half ago) who presents via EMS with chief complaint of Chest Pain.  Patient presents to ED with multiple chief complaints. Patient states sudden onset of dizziness and chest pressure about an hour prior to arrival. Patient states constant chest pressure since onset. Pt states accompanying shortness of breath, diaphoresis, and nausea. Patient states previous history of symptoms similar to those presented today with previous MI. Patient states previous history of MI x3 status post op cardiac stent placement x3, stating that the most recent stent was placed 4 weeks ago at [facility]. Patient also reports previous history of PE and is currently on Plavix after completing Eliquis about a month ago. Patient notes he recently took a 12 hour bus trip into [place] several days ago for a wedding.     Patient also notes upon onset of dizziness he "lost his balance" causing him to fall on his right shoulder with immediate onset of pain. Patient states constant right shoulder pain with associated deformity. Pt states aggravation with positional movement. Pt denies taking anything for symptoms prior to arrival. Patient states previous history of multiple right shoulder dislocations described as "about 15 times" that has previously required surgical intervention x3. Patient states he last ate "about two hours ago". Pt denies fever, chills, cough, congestion, abdominal pain, vomiting, diarrhea, difficulty with urination or dysuria.     There are no other acute medical concerns at this time.    PCP: Delcie RochAdams, Eric A, MD    Note written by Leone BrandBrittney Habel, Scribe, as dictated by Kearney Hardillon, Labrandon Knoch M, MD 12:39 AM    The history is provided by the patient.        Past Medical History:   Diagnosis Date   ??? Acute MI (HCC)    ??? Hypercholesteremia    ??? Hypertension    ??? Leukemia (HCC)    ??? Pulmonary embolism (HCC) 2018        Past Surgical History:   Procedure Laterality Date   ??? HX APPENDECTOMY     ??? HX BONE MARROW TRANSPLANT  2017   ??? HX CORONARY STENT PLACEMENT      2019, 2015 x 2   ??? HX ORTHOPAEDIC      shoulder surgery         History reviewed. No pertinent family history.    Social History     Socioeconomic History   ??? Marital status: DIVORCED     Spouse name: Not on file   ??? Number of children: Not on file   ??? Years of education: Not on file   ??? Highest education level: Not on file   Occupational History   ??? Not on file   Social Needs   ??? Financial resource strain: Not on file   ??? Food insecurity:     Worry: Not on file     Inability: Not on file   ??? Transportation needs:     Medical: Not on file     Non-medical: Not on file   Tobacco Use   ??? Smoking status: Current Every Day Smoker     Packs/day: 0.25   ??? Smokeless tobacco: Never Used   Substance and Sexual Activity   ??? Alcohol use: Never     Frequency: Never   ??? Drug use: No   ??? Sexual activity: Not on file   Lifestyle   ??? Physical activity:  Days per week: Not on file     Minutes per session: Not on file   ??? Stress: Not on file   Relationships   ??? Social connections:     Talks on phone: Not on file     Gets together: Not on file     Attends religious service: Not on file     Active member of club or organization: Not on file     Attends meetings of clubs or organizations: Not on file     Relationship status: Not on file   ??? Intimate partner violence:     Fear of current or ex partner: Not on file     Emotionally abused: Not on file     Physically abused: Not on file     Forced sexual activity: Not on file   Other Topics Concern   ??? Not on file   Social History Narrative   ??? Not on file         ALLERGIES: Pcn [penicillins]    Review of Systems   Constitutional: Positive for diaphoresis. Negative for chills and fever.   HENT: Negative for congestion.    Respiratory: Positive for chest tightness and shortness of breath. Negative for cough.    Cardiovascular: Positive for  chest pain.   Gastrointestinal: Positive for nausea. Negative for abdominal pain, diarrhea and vomiting.   Genitourinary: Negative for difficulty urinating and dysuria.   Musculoskeletal: Positive for arthralgias.   Neurological: Positive for dizziness. Negative for syncope.   All other systems reviewed and are negative.      Vitals:    02/18/18 0039 02/18/18 0100 02/18/18 0217   BP: 141/87 144/79 130/82   Pulse: 79 75 79   Resp: 17 23    Temp: 97.8 ??F (36.6 ??C)     SpO2: 98% 97%             Physical Exam   Constitutional: He is oriented to person, place, and time. He appears well-developed and well-nourished. No distress.   HENT:   Head: Normocephalic.   Mouth/Throat: Oropharynx is clear and moist.   Eyes: Conjunctivae are normal.   Neck: Normal range of motion. Neck supple.   Cardiovascular: Normal rate and regular rhythm.   Pulmonary/Chest: Effort normal and breath sounds normal. No respiratory distress.   Abdominal: Soft. Bowel sounds are normal. There is no tenderness.   Musculoskeletal:        Right shoulder: He exhibits tenderness and deformity. He exhibits no crepitus.        Right lower leg: He exhibits edema (1+).        Left lower leg: He exhibits edema (1+).   Right shoulder appears to be dislocated. Neurovascularly intact   Neurological: He is alert and oriented to person, place, and time.   No gross motor or sensory deficits   Skin: Skin is warm. Capillary refill takes less than 2 seconds. No rash noted.   Nursing note and vitals reviewed.   Note written by Leone Brand, Scribe, as dictated by Kearney Hard, MD 12:39 AM      MDM  Number of Diagnoses or Management Options  Chest pain, unspecified type:   Subluxation of right shoulder joint, initial encounter:   Diagnosis management comments: 32 year old male brought in by EMS from [facility] station after report of near syncopal episode fall resulting in dislocation of his right shoulder and having chest pain.  Patient reports history of multiple  shoulder dislocations and [place].  Patient also reports having multiple stents and history of PE which he was taken off of Eliquis 1 month ago.  Patient also reports history of lymphoma and remission  Looking at the patient he is in no acute distress despite right shoulder appearing to be possibly dislocated.  EKG was unremarkable.  Initial shoulder x-ray showing signs concerning for dislocation  Patient was given 4 mg of morphine and discussion for sedation for reduction.  Patient stated "just pull on my shoulder"  During manual reduction with traction/countertraction shoulder appeared to be reduced and patient reported improvement of his pain  Repeat x-rays showed continued dislocation despite the patient being in no acute distress sitting the bed comfortably  Spoke with orthopedics.    On review of patient's records: Patient has been seen multiple times for the same exact story in which she has an episode of syncope or near syncope fallen and dislocated his shoulder and having chest pain.  Other facilities have diagnosed him with malingering and shoulder subluxation.  In addition other facilities and spoken with radiologist stating that there is no apparent stents present.  Patient has a negative d-dimer and 2- troponins.  Axillary film showing no showed a reduction despite no further manual reductions performed.  This is arises concerning for malingering.  After conversation with the patient he does not seem surprised and does not express any concern with being discharged home.    Discussed the discharge impression and any labs and the results with the patient. Answered any questions and addressed any concerns. Discussed the importance of following up with their primary care provider and/or specialist.  Discussed signs or symptoms that would warrant return back to the ER for further evaluation. The patient is agreeable with discharge.         Amount and/or Complexity of Data  Reviewed  Clinical lab tests: reviewed  Tests in the radiology section of CPT??: reviewed  Tests in the medicine section of CPT??: reviewed      ED Course as of Feb 18 557   Sun Feb 18, 2018   0234 Troponin-I, Qt.: <0.05 [ZD]   0311 Spoke with Dr. Theresia Lo      [ZD]      ED Course User Index  [ZD] Kearney Hard, MD       Reduction of Joint  Date/Time: 02/18/2018 1:55 AM  Performed by: Kearney Hard, MD  Authorized by: Kearney Hard, MD     Consent:     Consent obtained:  Verbal    Consent given by:  Patient    Risks discussed:  Nerve damage, vascular damage and pain    Alternatives discussed:  No treatment and referral  Injury:     Injury location:  Shoulder    Shoulder injury location:  R shoulder    Hill-Sachs deformity: no    Pre-procedure assessment:     Neurological function: normal      Distal perfusion: normal      Range of motion: reduced    Procedure details:     Manipulation performed: yes      Skin traction used: no      Reduction successful: yes      X-ray confirmed reduction: no      Immobilization:  Sling  Post-procedure assessment:     Neurological function: normal      Distal perfusion: normal      Range of motion: normal      Patient  tolerance of procedure:  Tolerated well, no immediate complications          ED EKG interpretation:  Rhythm: normal sinus rhythm; and regular . Rate (approx.): 75 bpm; Axis: normal; ST/T wave: normal;   Note written by Leone BrandBrittney Habel, Scribe, as dictated by Kearney Hardillon, Loc Feinstein M, MD 12:44 AM    PROGRESS NOTE:2:53 AM  Xr Shoulder Rt Ap/lat Min 2 V    Result Date: 02/18/2018  IMPRESSION: Persistent anterior medial right shoulder dislocation    Xr Shoulder Rt Ap/lat Min 2 V    Result Date: 02/18/2018  IMPRESSION: Acute anterior medial right shoulder dislocation    Xr Chest Port    Result Date: 02/18/2018  IMPRESSION: Normal chest.    Consulting Ortho for persistent shoulder dislocation.

## 2018-02-18 NOTE — ED Notes (Signed)
Provider reviewed discharge instructions and options with patient and patient verbalized understanding. RN reviewed discharge instructions using teachback method. Pt ambulated to exit without difficulty and in no signs of acute distress. Patient was counseled on medications prescribed at discharge. VSS at time of discharge. No complaints, needs, or questions at this time. Pt to call PCP in the morning for follow up.

## 2018-02-18 NOTE — Consults (Addendum)
ORTHO CONSULT NOTE    Date of Consultation:  February 18, 2018  Referring Physician:  Madelin Rearillon  CC: right shoulder pain    HPI:  Evan Woods is a 32 y.o. right hand dom male who c/o right shoulder pain s/p falling backwards onto outstretched arm earlier today.  Patient has extensive h/o right shoulder dislocations and required labral [facility] 2016 with 8 subsequent dislocations which he can reduce on his own.  Some reductions in ER and a few in OR.  Normally traction counter-traction procedure is successful.  Sometimes requires prone with weight.  He denies ability to self dislocate.  Tonight, ER MD attempted reduction which seemed successful with improved pain but xrays (without axillary) concerning for persistent dislocation.  Denies numbness/tingling.  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    Upon chart review, multiple ER visits at outside hospitals for reported right shoulder dislocation but axillary views document located shoulder.  He was seen 3 times in May for this complaint without documented dislocation.      Past Medical History:   Diagnosis Date   ??? Acute MI (HCC)    ??? Hypercholesteremia    ??? Hypertension    ??? Leukemia (HCC)    ??? Pulmonary embolism (HCC) 2018      Past Surgical History:   Procedure Laterality Date   ??? HX APPENDECTOMY     ??? HX BONE MARROW TRANSPLANT  2017   ??? HX CORONARY STENT PLACEMENT      2019, 2015 x 2   ??? HX ORTHOPAEDIC      shoulder surgery      History reviewed. No pertinent family history.   Social History     Tobacco Use   ??? Smoking status: Current Every Day Smoker     Packs/day: 0.25   ??? Smokeless tobacco: Never Used   Substance Use Topics   ??? Alcohol use: Never     Frequency: Never        Allergies   Allergen Reactions   ??? Pcn [Penicillins] Anaphylaxis        Review of Systems:  Per HPI.    Objective:     Patient Vitals for the past 8 hrs:   BP Temp Pulse Resp SpO2   02/18/18 0217 130/82 ??? 79 ??? ???   02/18/18 0100 144/79 ??? 75 23 97 %    02/18/18 0039 141/87 97.8 ??F (36.6 ??C) 79 17 98 %     Temp (24hrs), Avg:97.8 ??F (36.6 ??C), Min:97.8 ??F (36.6 ??C), Max:97.8 ??F (36.6 ??C)      EXAM:   NAD. Sleeping but I can awaken. Rates pain 8/10. Appears comfortable.  Moves left UE OK. SILT. Palp pulses.    Right shoulder no obvious deformity. Skin intact.  No obvious effusion.  He can forward flex and abduct shoulder actively.  Wrist/digits nl AROM.  SILT right forearm and upper arm.  Palp radial pulse.       Imaging Review:   Results from Hospital Encounter encounter on 02/18/18   XR SHOULDER RT AP/LAT MIN 2 V    Narrative EXAM: XR SHOULDER RT AP/LAT MIN 2 V    INDICATION: s/p first reduction attempt    COMPARISON: Prereduction.    FINDINGS: Three views of the right shoulder demonstrate persistent anterior  medial dislocation.      Impression IMPRESSION: Persistent anterior medial right shoulder dislocation     Right shoulder Axillary 02/18/18 04:19  IMPRESSION: Successful right shoulder  reduction      Impression:   There are no active problems to display for this patient.    Active Problems:    * No active hospital problems. *    Axillary view proves right shoulder located normally.    Plan:   Given right shoulder is currently located, OK to discharge from Ortho perspective.  Sling.  I would be reluctant to give him additional narcotics for current ortho event given details of chart review revealing recurrent presentation for dislocation but not proven with imaging.    F/U with his Orthopedist in [place] (or if patient staying in [place], [facility]) this week.      Lianne Cure, PA  Orthopedic Trauma Service  [facility]

## 2018-02-18 NOTE — ED Provider Notes (Signed)
32 y.o. male with past medical history significant for Hypertension, Hypercholesterolemia, and Leukemia (remission a year and a half ago) who presents via EMS with chief complaint of Chest Pain.  Patient presents to ED with multiple chief complaints. Patient states sudden onset of dizziness and chest pressure about an hour prior to arrival. Patient states constant chest pressure since onset. Pt states accompanying shortness of breath, diaphoresis, and nausea. Patient states previous history of symptoms similar to those presented today with previous MI. Patient states previous history of MI x3 status post op cardiac stent placement x3, stating that the most recent stent was placed 4 weeks ago at [facility]. Patient also reports previous history of PE and is currently on Plavix after completing Eliquis about a month ago. Patient notes he recently took a 12 hour bus trip into [place] several days ago for a wedding.     Patient also notes upon onset of dizziness he "lost his balance" causing him to fall on his right shoulder with immediate onset of pain. Patient states constant right shoulder pain with associated deformity. Pt states aggravation with positional movement. Pt denies taking anything for symptoms prior to arrival. Patient states previous history of multiple right shoulder dislocations described as "about 15 times" that has previously required surgical intervention x3. Patient states he last ate "about two hours ago". Pt denies fever, chills, cough, congestion, abdominal pain, vomiting, diarrhea, difficulty with urination or dysuria.     There are no other acute medical concerns at this time.    PCP: Delcie RochAdams, Eric A, MD    Note written by Leone BrandBrittney Habel, Scribe, as dictated by Kearney Hardillon, Labrandon Knoch M, MD 12:39 AM    The history is provided by the patient.        Past Medical History:   Diagnosis Date   ??? Acute MI (HCC)    ??? Hypercholesteremia    ??? Hypertension    ??? Leukemia (HCC)    ??? Pulmonary embolism (HCC) 2018        Past Surgical History:   Procedure Laterality Date   ??? HX APPENDECTOMY     ??? HX BONE MARROW TRANSPLANT  2017   ??? HX CORONARY STENT PLACEMENT      2019, 2015 x 2   ??? HX ORTHOPAEDIC      shoulder surgery         History reviewed. No pertinent family history.    Social History     Socioeconomic History   ??? Marital status: DIVORCED     Spouse name: Not on file   ??? Number of children: Not on file   ??? Years of education: Not on file   ??? Highest education level: Not on file   Occupational History   ??? Not on file   Social Needs   ??? Financial resource strain: Not on file   ??? Food insecurity:     Worry: Not on file     Inability: Not on file   ??? Transportation needs:     Medical: Not on file     Non-medical: Not on file   Tobacco Use   ??? Smoking status: Current Every Day Smoker     Packs/day: 0.25   ??? Smokeless tobacco: Never Used   Substance and Sexual Activity   ??? Alcohol use: Never     Frequency: Never   ??? Drug use: No   ??? Sexual activity: Not on file   Lifestyle   ??? Physical activity:  Days per week: Not on file     Minutes per session: Not on file   ??? Stress: Not on file   Relationships   ??? Social connections:     Talks on phone: Not on file     Gets together: Not on file     Attends religious service: Not on file     Active member of club or organization: Not on file     Attends meetings of clubs or organizations: Not on file     Relationship status: Not on file   ??? Intimate partner violence:     Fear of current or ex partner: Not on file     Emotionally abused: Not on file     Physically abused: Not on file     Forced sexual activity: Not on file   Other Topics Concern   ??? Not on file   Social History Narrative   ??? Not on file         ALLERGIES: Pcn [penicillins]    Review of Systems   Constitutional: Positive for diaphoresis. Negative for chills and fever.   HENT: Negative for congestion.    Respiratory: Positive for chest tightness and shortness of breath. Negative for cough.     Cardiovascular: Positive for chest pain.   Gastrointestinal: Positive for nausea. Negative for abdominal pain, diarrhea and vomiting.   Genitourinary: Negative for difficulty urinating and dysuria.   Musculoskeletal: Positive for arthralgias.   Neurological: Positive for dizziness. Negative for syncope.   All other systems reviewed and are negative.      Vitals:    02/18/18 0039 02/18/18 0100 02/18/18 0217   BP: 141/87 144/79 130/82   Pulse: 79 75 79   Resp: 17 23    Temp: 97.8 ??F (36.6 ??C)     SpO2: 98% 97%             Physical Exam   Constitutional: He is oriented to person, place, and time. He appears well-developed and well-nourished. No distress.   HENT:   Head: Normocephalic.   Mouth/Throat: Oropharynx is clear and moist.   Eyes: Conjunctivae are normal.   Neck: Normal range of motion. Neck supple.   Cardiovascular: Normal rate and regular rhythm.   Pulmonary/Chest: Effort normal and breath sounds normal. No respiratory distress.   Abdominal: Soft. Bowel sounds are normal. There is no tenderness.   Musculoskeletal:        Right shoulder: He exhibits tenderness and deformity. He exhibits no crepitus.        Right lower leg: He exhibits edema (1+).        Left lower leg: He exhibits edema (1+).   Right shoulder appears to be dislocated. Neurovascularly intact   Neurological: He is alert and oriented to person, place, and time.   No gross motor or sensory deficits   Skin: Skin is warm. Capillary refill takes less than 2 seconds. No rash noted.   Nursing note and vitals reviewed.   Note written by Leone Brand, Scribe, as dictated by Kearney Hard, MD 12:39 AM      MDM  Number of Diagnoses or Management Options  Chest pain, unspecified type:   Subluxation of right shoulder joint, initial encounter:   Diagnosis management comments: 32 year old male brought in by EMS from [facility] station after report of near syncopal episode fall resulting in dislocation of his right shoulder and having chest pain.   Patient reports history of multiple shoulder dislocations and has [place].  Patient also reports having multiple stents and history of PE which he was taken off of Eliquis 1 month ago.  Patient also reports history of lymphoma and remission  Looking at the patient he is in no acute distress despite right shoulder appearing to be possibly dislocated.  EKG was unremarkable.  Initial shoulder x-ray showing signs concerning for dislocation  Patient was given 4 mg of morphine and discussion for sedation for reduction.  Patient stated "just pull on my shoulder"  During manual reduction with traction/countertraction shoulder appeared to be reduced and patient reported improvement of his pain  Repeat x-rays showed continued dislocation despite the patient being in no acute distress sitting the bed comfortably  Spoke with orthopedics.    On review of patient's records: Patient has been seen multiple times for the same exact story in which she has an episode of syncope or near syncope fallen and dislocated his shoulder and having chest pain.  Other facilities have diagnosed him with malingering and shoulder subluxation.  In addition other facilities and spoken with radiologist stating that there is no apparent stents present.  Patient has a negative d-dimer and 2- troponins.  Axillary film showing no showed a reduction despite no further manual reductions performed.  This is arises concerning for malingering.  After conversation with the patient he does not seem surprised and does not express any concern with being discharged home.    Discussed the discharge impression and any labs and the results with the patient. Answered any questions and addressed any concerns. Discussed the importance of following up with their primary care provider and/or specialist.  Discussed signs or symptoms that would warrant return back to the ER for further evaluation. The patient is agreeable with discharge.          Amount and/or Complexity of Data Reviewed  Clinical lab tests: reviewed  Tests in the radiology section of CPT??: reviewed  Tests in the medicine section of CPT??: reviewed      ED Course as of Feb 18 557   Sun Feb 18, 2018   0234 Troponin-I, Qt.: <0.05 [ZD]   0311 Spoke with Dr. Theresia Lo      [ZD]      ED Course User Index  [ZD] Kearney Hard, MD       Reduction of Joint  Date/Time: 02/18/2018 1:55 AM  Performed by: Kearney Hard, MD  Authorized by: Kearney Hard, MD     Consent:     Consent obtained:  Verbal    Consent given by:  Patient    Risks discussed:  Nerve damage, vascular damage and pain    Alternatives discussed:  No treatment and referral  Injury:     Injury location:  Shoulder    Shoulder injury location:  R shoulder    Hill-Sachs deformity: no    Pre-procedure assessment:     Neurological function: normal      Distal perfusion: normal      Range of motion: reduced    Procedure details:     Manipulation performed: yes      Skin traction used: no      Reduction successful: yes      X-ray confirmed reduction: no      Immobilization:  Sling  Post-procedure assessment:     Neurological function: normal      Distal perfusion: normal      Range of motion: normal      Patient  tolerance of procedure:  Tolerated well, no immediate complications          ED EKG interpretation:  Rhythm: normal sinus rhythm; and regular . Rate (approx.): 75 bpm; Axis: normal; ST/T wave: normal;   Note written by Leone BrandBrittney Habel, Scribe, as dictated by Kearney Hardillon, Loc Feinstein M, MD 12:44 AM    PROGRESS NOTE:2:53 AM  Xr Shoulder Rt Ap/lat Min 2 V    Result Date: 02/18/2018  IMPRESSION: Persistent anterior medial right shoulder dislocation    Xr Shoulder Rt Ap/lat Min 2 V    Result Date: 02/18/2018  IMPRESSION: Acute anterior medial right shoulder dislocation    Xr Chest Port    Result Date: 02/18/2018  IMPRESSION: Normal chest.    Consulting Ortho for persistent shoulder dislocation.

## 2018-02-18 NOTE — ED Triage Notes (Signed)
Pt arrives ambulatory via EMS from the [facility] got dizzy with chest tightness, fell and stuck his arm out to break the fall. Obvious deformity noted to R shoulder. Pt states he is currently having CP, SOB, nausea.    Pt states he's had several dislocations, 2 surgeries.    Pt has had 3 MIs, had 3 stents, PE, HTN. Currently on Plavix, aspirin, metoprolol, lipitor.

## 2018-02-18 NOTE — ED Notes (Addendum)
Provider reviewed discharge instructions and options with patient and patient verbalized understanding. RN reviewed discharge instructions using teachback method. Pt ambulated to exit without difficulty and in no signs of acute distress. Patient was counseled on medications prescribed at discharge. VSS at time of discharge. No complaints, needs, or questions at this time. Pt to call PCP in the morning for follow up.

## 2018-02-25 ENCOUNTER — Inpatient Hospital Stay
Admit: 2018-02-25 | Discharge: 2018-02-26 | Discharge status: Against Medical Advice | Payer: Self-pay | Attending: Emergency Medicine

## 2018-02-25 ENCOUNTER — Emergency Department: Admit: 2018-02-25 | Payer: Self-pay | Primary: Ophthalmology

## 2018-02-25 DIAGNOSIS — R079 Chest pain, unspecified: Secondary | ICD-10-CM

## 2018-02-25 NOTE — ED Notes (Signed)
Hx of mi chest pain [place] the [place] and possible dislocated shoulder as well

## 2018-02-25 NOTE — ED Provider Notes (Signed)
ED Provider Notes by Caren Griffins, PA-C at 02/25/18 2031                Author: Caren Griffins, PA-C  Service: Emergency Medicine  Author Type: Physician Assistant       Filed: 02/26/18 0431  Date of Service: 02/25/18 2031  Status: Attested           Editor: Bitzer, Philip Aspen (Physician Assistant)  Cosigner: Posey Pronto, MD at 03/01/18 1202          Attestation signed by Posey Pronto, MD at 03/01/18 1202          I have discussed the patient with the mid-level provider. I agree with their evaluation and treatment plan as documented here. I did not see this patient personally,  but I was available to see the patient at their request.                                    [facility]   Emergency Department Treatment Report                Patient: Evan Woods  Age: 32 y.o.  Sex: male          Date of Birth: [birth year]  Admit Date: 02/25/2018  PCP: Delcie Roch, MD     MRN: [id number]   CSN: [id number]   Attending: Posey Pronto, MD         Room: ER11/ER11  Time Dictated: 8:31 PM  APP: Caren Griffins, PA-C        Chief Complaint      Chief Complaint       Patient presents with        ?  Chest Pain             chest pain per [place] the beach and now pt states fall and possible dislocated rt shoulder         ?  Shoulder Pain                  History of Present Illness     32 y.o. male  with hx of CAD, HTN, DM, ALL in remission who presents for evaluation of chest pain and right shoulder pain. Patient states he was at the beach when he had sudden onset of left sided chest pain radiating to the left arm and left side of jaw that started  45 min to an hour ago. He describes the pain as constant sharp/pressure and reports it feels exactly like prior MIs. 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  He states he took 3 nitro and 324 mg of ASA PTA with minimal improvement.       He reports hx of MI x2 in 2015 s/p cardiac sent x2 and another MI 5 weeks ago. He states he saw his cardiologist 3 days ago and still had elevated troponin, but states the cardiologist does not want to do anything further at this time. He reports extensive  family hx of CAD including mother.       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  He is a smoker. Denies drug use.       He reports current medications are metoprolol, plavix, lipitor, and ASA.         Review of Systems     Review of Systems    Constitutional: Positive for diaphoresis. Negative for chills and fever.    HENT: Negative for congestion and sore throat.     Eyes: Negative for blurred vision and double vision.    Respiratory: Positive for shortness of breath. Negative for cough.     Cardiovascular: Positive for chest pain. Negative for leg swelling.    Gastrointestinal: Positive for nausea. Negative for abdominal pain and vomiting.    Genitourinary: Negative for dysuria and frequency.    Musculoskeletal: Positive for joint pain (right shoulder ) . Negative for back pain and neck pain.    Skin: Negative for rash.    Neurological: Positive for dizziness and tingling  (right 4th and 5th digits). Negative for headaches.            Past Medical/Surgical History          Past Medical History:        Diagnosis  Date         ?  Acute MI (HCC)       ?  Hypercholesteremia       ?  Hypertension       ?  Leukemia (HCC)           ?  Pulmonary embolism (HCC)  2018          Past Surgical History:         Procedure  Laterality  Date          ?  HX APPENDECTOMY         ?  HX BONE MARROW TRANSPLANT    2017     ?  HX CORONARY STENT PLACEMENT              2019, 2015 x 2          ?  HX ORTHOPAEDIC              shoulder surgery             Social History          Social History          Socioeconomic History          ?  Marital status:  DIVORCED              Spouse name:  Not on file         ?  Number of children:  Not on file     ?  Years of education:  Not on file     ?  Highest education level:  Not on file       Occupational History        ?  Not on file       Social Needs         ?  Financial resource strain:  Not on file        ?  Food insecurity:              Worry:  Not on file         Inability:  Not on file        ?  Transportation needs:              Medical:  Not on file  Non-medical:  Not on file       Tobacco Use         ?  Smoking status:  Current Every Day Smoker              Packs/day:  0.25         ?  Smokeless tobacco:  Never Used       Substance and Sexual Activity         ?  Alcohol use:  Never              Frequency:  Never         ?  Drug use:  No     ?  Sexual activity:  Not on file       Lifestyle        ?  Physical activity:              Days per week:  Not on file         Minutes per session:  Not on file         ?  Stress:  Not on file       Relationships        ?  Social connections:              Talks on phone:  Not on file         Gets together:  Not on file         Attends religious service:  Not on file         Active member of club or organization:  Not on file         Attends meetings of clubs or organizations:  Not on file         Relationship status:  Not on file        ?  Intimate partner violence:              Fear of current or ex partner:  Not on file         Emotionally abused:  Not on file         Physically abused:  Not on file         Forced sexual activity:  Not on file        Other Topics  Concern        ?  Not on file       Social History Narrative        ?  Not on file             Family History     History reviewed. No pertinent family history.   Mother- CAD     Current Medications        (Not in a hospital admission)        Allergies          Allergies        Allergen  Reactions         ?  Pcn [Penicillins]  Anaphylaxis             Physical Exam          ED Triage Vitals      ED Encounter Vitals Group            BP  02/25/18 1945  152/83        Pulse (Heart Rate)  02/25/18 1945  (!) 102        Resp Rate  02/25/18 1945  20        Temp  02/25/18 1945  98.3 ??F (36.8 ??C)        Temp src  --          O2 Sat (%)  02/25/18 1945  98 %        Weight  02/25/18 1939  220 lb            Height  02/25/18 1939  6'           Constitutional: Patient appears well developed and well nourished. Appears nontoxic but very uncomfortable feeling.     HENT: Normal cephalic and atraumatic.   Mucous membranes moist, non-erythematous and without lesions. Uvula is midline.   Eyes: Conjunctivae are clear with no discharge.   Neck: Normal ROM and non tender. Supple. No nuchal rigidity.    Respiratory: Breath sounds are equal bilaterally. Lungs clear to auscultation with nonlabored respirations. No tachypnea or accessory muscle  use.   Cardiovascular: Normal rate and rhythm.  Distal pulses 2+ and equal bilaterally. Calves soft with no obvious tenderness. No lower extremity  edema.    Gastrointestinal: Bowel sounds normal. Abdomen soft and without complaint of pain to palpation. No distention. No masses palpable.    Musculoskeletal: ROM of right shoulder significantly limited by pain. No palpable depression in the lateral shoulder, however does appear deformed  (anteriorly and inferiorly) in comparison to left shoulder. Normal ROM of elbow and wrist.    Lymphatic: No cervical adenopathy.   Integumentary: Warm and dry without rashes or erythema.   Neurologic: Alert and oriented. No facial asymmetry or dysarthria. Equal grip strengths in upper extremities. Diminished sensation right 4th  and 5th digits.    Psychological: Behavior is age and situation appropriate. No difficulty with memory.          Impression and Management Plan     This is a 31 y.o.  male with chest pain and right shoulder pain with concerns for recurrent dislocation.  Acute ischemic coronary disease must be considered first, and with the patient  protected against the consequences  of the same, while other etiologies (including infectious, pulmonary, GI, and MSK considered).  We will order appropriate labs and tests to evaluate and rule out immediate threats to life and emergent causes of symptoms, and disposition based on the results  of diagnostic testing and the response to symptomatic treatment, with considerations of the patient's concerns and desires for ongoing and follow up care. Patient already had 3 nitro and ASA PTA.         Diagnostic Studies     Lab:      Recent Results (from the past 24 hour(s))     CBC WITH AUTOMATED DIFF          Collection Time: 02/25/18  7:55 PM         Result  Value  Ref Range            WBC  6.4  4.0 - 11.0 1000/mm3       RBC  4.28  3.80 - 5.70 M/uL       HGB  13.0  12.4 - 17.2 gm/dl       HCT  16.1  09.6 - 50.0 %       MCV  93.9  80.0 - 98.0 fL       MCH  30.4  23.0 - 34.6 pg       MCHC  32.3  30.0 - 36.0 gm/dl       PLATELET  161256  096140 - 450 1000/mm3       MPV  9.9  6.0 - 10.0 fL       RDW-SD  49.8 (H)  35.1 - 43.9         NRBC  0  0 - 0         IMMATURE GRANULOCYTES  0.2  0.0 - 3.0 %       NEUTROPHILS  54.9  34 - 64 %       LYMPHOCYTES  27.5 (L)  28 - 48 %       MONOCYTES  13.8 (H)  1 - 13 %       EOSINOPHILS  2.8  0 - 5 %       BASOPHILS  0.8  0 - 3 %       METABOLIC PANEL, BASIC          Collection Time: 02/25/18  7:55 PM         Result  Value  Ref Range            Sodium  141  136 - 145 mEq/L       Potassium  3.9  3.5 - 5.1 mEq/L       Chloride  105  98 - 107 mEq/L       CO2  29  21 - 32 mEq/L       Glucose  89  74 - 106 mg/dl       BUN  13  7 - 25 mg/dl       Creatinine  1.2  0.6 - 1.3 mg/dl       GFR est AA  >04>60          GFR est non-AA  >60          Calcium  8.9  8.5 - 10.1 mg/dl       Anion gap  7  5 - 15 mmol/L       TROPONIN I          Collection Time: 02/25/18  7:55 PM         Result  Value  Ref Range            Troponin-I  <0.015  0.000 - 0.045 ng/ml          Labs Reviewed       CBC WITH AUTOMATED DIFF -  Abnormal; Notable for the following components:            Result  Value            RDW-SD  49.8 (*)         LYMPHOCYTES  27.5 (*)         MONOCYTES  13.8 (*)            All other components within normal limits       METABOLIC PANEL, BASIC       TROPONIN I           Imaging:     Xr Chest Sngl V      Result Date: 02/25/2018   INDICATION: Chest pain.     TECHNIQUE: PA/lateral Chest Radiograph. COMPARISON: 05/13/2014       IMPRESSION: Heart size is normal. Lungs are clear and well aerated. No focal consolidation.       Xr Shoulder Rt Ap/lat Min 2 V  Result Date: 02/25/2018   INDICATION: Right shoulder pain.      COMPARISON: 05/15/2014 TECHNIQUE: 3views right shoulder       IMPRESSION: There is suboptimal patient positioning. On Y view, there is question of mild anterior subluxation at the glenohumeral joint. Please correlate clinically. No evidence of acute fracture.           EKG: Sinus tachycardia at 115 bpm. PR int. 152 ms, QTc int. 439 ms. No acute ST or T-wave abnormalities that are consistent with acute ischemia or infarction per ED attending.            ED Course/Medical Decision Making     Patient's chart was reviewed. Patient was evaluated at [facility] this morning for complaints of near syncope and right shoulder injury. Labs, EKG, and XRs were performed which  showed no acute findings. Given suspiciously similar story, chart was further reviewed which shows countless ED visits to hospitals in several different states for the same complaints of chest pain, dizziness, and right shoulder injuries in which patient  almost always leaves AMA when further assessment is recommended. Suspicions for malingering and drug seeking behavior have been well documented. There are several notes stating subsequent cardiac catheterizations after alleged stents were placed indicate  no stents seen. I am unable to find any documentation of actual pathology to include self reported hx of CAD, PE, ALL, brain tumor, CVA,  seizure disorder.       Shoulder XR shows questionable mild anterior subluxation at the glenohumeral joint. Labs tonight shows negative troponin and no acute abnormalities seen on EKG concerning for acute ischemia/infarction. Remainder of labs are unremarkable.       Results were discussed with patient. I asked the patient about his visit to [facility] this morning and he states the same thing happened to him this morning and notes "I just keeps getting dizzy and almost passing out" which he states has been ongoing for  several months. He seemed frustrated at that time and said he wanted to leave AMA and started taking off his blood pressure cuff and pulse ox, suddenly using the right arm without difficulty. Discussed with patient, given his (alleged) history, we do  not advise he leaves at this time however he insists he is leaving. Patient verbalized understanding of his choice to leave AMA and signed AMA form. Patient removed his IV and disposed of it in the sharps container while I was in the room with the patient.          Medications - No data to display        Final Diagnosis                 ICD-10-CM  ICD-9-CM          1.  Chest pain, unspecified type  R07.9  786.50          2.  Pain in joint of right shoulder  M25.511  719.41             Disposition     AMA        Discharge Medication List as of 02/25/2018  9:35 PM                  Patient has been evaluated by myself and discussed with [facility], Jonelle Sports, MD who agrees with the above assessment and plan.      Caren Griffins, PA-C   February 26, 2018   8:31 PM  My signature above authenticates this document and my orders, the final diagnosis (es), discharge prescription (s), and instructions in the Epic record.   If you have any questions please contact [phone].       Nursing notes have been reviewed by the physician/ advanced practice     Clinician.

## 2018-02-25 NOTE — ED Notes (Signed)
Hx of mi chest pain [place] the [place] and possible dislocated shoulder as well

## 2018-02-25 NOTE — ED Provider Notes (Signed)
[facility]  Emergency Department Treatment Report        Patient: Evan Woods Age: 32 y.o. Sex: male    Date of Birth: 05/03/1986 Admit Date: 02/25/2018 PCP: Delcie Roch, MD   MRN: [id number]  CSN: [id number]  Attending: Posey Pronto, MD   Room: ER11/ER11 Time Dictated: 8:31 PM APP: Caren Griffins, PA-C     Chief Complaint   Chief Complaint   Patient presents with   ??? Chest Pain     chest pain per pt while at the beach and now pt states fall and possible dislocated rt shoulder    ??? Shoulder Pain            History of Present Illness   32 y.o. male with hx of CAD, HTN, DM, ALL in remission who presents for evaluation of chest pain and right shoulder pain. Patient states he was at the beach when he had sudden onset of left sided chest pain radiating to the left arm and left side of jaw that started 45 min to an hour ago. He describes the pain as constant sharp/pressure and reports it feels exactly like prior MIs. 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  He states he took 3 nitro and 324 mg of ASA PTA with minimal improvement.     He reports hx of MI x2 in 2015 s/p cardiac sent x2 and another MI 5 weeks ago. He states he saw his cardiologist 3 days ago and still had elevated troponin, but states the cardiologist does not want to do anything further at this time. He reports extensive family hx of CAD including mother.     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 He is a smoker. Denies drug use.     He reports current medications are metoprolol, plavix, lipitor, and ASA.     Review of Systems   Review of Systems   Constitutional: Positive for diaphoresis. Negative for chills and fever.   HENT: Negative for congestion and sore throat.     Eyes: Negative for blurred vision and double vision.   Respiratory: Positive for shortness of breath. Negative for cough.    Cardiovascular: Positive for chest pain. Negative for leg swelling.   Gastrointestinal: Positive for nausea. Negative for abdominal pain and vomiting.   Genitourinary: Negative for dysuria and frequency.   Musculoskeletal: Positive for joint pain (right shoulder ). Negative for back pain and neck pain.   Skin: Negative for rash.   Neurological: Positive for dizziness and tingling (right 4th and 5th digits). Negative for headaches.       Past Medical/Surgical History     Past Medical History:   Diagnosis Date   ??? Acute MI (HCC)    ??? Hypercholesteremia    ??? Hypertension    ??? Leukemia (HCC)    ??? Pulmonary embolism (HCC) 2018     Past Surgical History:   Procedure Laterality Date   ??? HX APPENDECTOMY     ??? HX BONE MARROW TRANSPLANT  2017   ??? HX CORONARY STENT PLACEMENT      2019, 2015 x 2   ??? HX ORTHOPAEDIC      shoulder surgery       Social History  Social History     Socioeconomic History   ??? Marital status: DIVORCED     Spouse name: Not on file   ??? Number of children: Not on file   ??? Years of education: Not on file   ??? Highest education level: Not on file   Occupational History   ??? Not on file   Social Needs   ??? Financial resource strain: Not on file   ??? Food insecurity:     Worry: Not on file     Inability: Not on file   ??? Transportation needs:     Medical: Not on file     Non-medical: Not on file   Tobacco Use   ??? Smoking status: Current Every Day Smoker     Packs/day: 0.25   ??? Smokeless tobacco: Never Used   Substance and Sexual Activity   ??? Alcohol use: Never     Frequency: Never   ??? Drug use: No   ??? Sexual activity: Not on file   Lifestyle   ??? Physical activity:     Days per week: Not on file     Minutes per session: Not on file   ??? Stress: Not on file   Relationships   ??? Social connections:     Talks on phone: Not on file     Gets together: Not on file      Attends religious service: Not on file     Active member of club or organization: Not on file     Attends meetings of clubs or organizations: Not on file     Relationship status: Not on file   ??? Intimate partner violence:     Fear of current or ex partner: Not on file     Emotionally abused: Not on file     Physically abused: Not on file     Forced sexual activity: Not on file   Other Topics Concern   ??? Not on file   Social History Narrative   ??? Not on file       Family History   History reviewed. No pertinent family history.  Mother- CAD  Current Medications     (Not in a hospital admission)    Allergies     Allergies   Allergen Reactions   ??? Pcn [Penicillins] Anaphylaxis       Physical Exam     ED Triage Vitals   ED Encounter Vitals Group      BP 02/25/18 1945 152/83      Pulse (Heart Rate) 02/25/18 1945 (!) 102      Resp Rate 02/25/18 1945 20      Temp 02/25/18 1945 98.3 ??F (36.8 ??C)      Temp src --       O2 Sat (%) 02/25/18 1945 98 %      Weight 02/25/18 1939 220 lb      Height 02/25/18 1939 6'       Constitutional: Patient appears well developed and well nourished. Appears nontoxic but very uncomfortable feeling.    HENT: Normal cephalic and atraumatic.  Mucous membranes moist, non-erythematous and without lesions. Uvula is midline.  Eyes: Conjunctivae are clear with no discharge.  Neck: Normal ROM and non tender. Supple. No nuchal rigidity.   Respiratory: Breath sounds are equal bilaterally. Lungs clear to auscultation with nonlabored respirations. No tachypnea or accessory muscle use.  Cardiovascular: Normal rate and rhythm.  Distal pulses 2+ and equal bilaterally. Calves soft with no  obvious tenderness. No lower extremity edema.   Gastrointestinal: Bowel sounds normal. Abdomen soft and without complaint of pain to palpation. No distention. No masses palpable.   Musculoskeletal: ROM of right shoulder significantly limited by pain. No palpable depression in the lateral shoulder, however does appear deformed  (anteriorly and inferiorly) in comparison to left shoulder. Normal ROM of elbow and wrist.   Lymphatic: No cervical adenopathy.  Integumentary: Warm and dry without rashes or erythema.  Neurologic: Alert and oriented. No facial asymmetry or dysarthria. Equal grip strengths in upper extremities. Diminished sensation right 4th and 5th digits.   Psychological: Behavior is age and situation appropriate. No difficulty with memory.      Impression and Management Plan   This is a 32 y.o. male with chest pain and right shoulder pain with concerns for recurrent dislocation.  Acute ischemic coronary disease must be considered first, and with the patient protected against the consequences of the same, while other etiologies (including infectious, pulmonary, GI, and MSK considered).  We will order appropriate labs and tests to evaluate and rule out immediate threats to life and emergent causes of symptoms, and disposition based on the results of diagnostic testing and the response to symptomatic treatment, with considerations of the patient's concerns and desires for ongoing and follow up care. Patient already had 3 nitro and ASA PTA.     Diagnostic Studies   Lab:   Recent Results (from the past 24 hour(s))   CBC WITH AUTOMATED DIFF    Collection Time: 02/25/18  7:55 PM   Result Value Ref Range    WBC 6.4 4.0 - 11.0 1000/mm3    RBC 4.28 3.80 - 5.70 M/uL    HGB 13.0 12.4 - 17.2 gm/dl    HCT 16.140.2 09.637.0 - 04.550.0 %    MCV 93.9 80.0 - 98.0 fL    MCH 30.4 23.0 - 34.6 pg    MCHC 32.3 30.0 - 36.0 gm/dl    PLATELET 409256 811140 - 914450 1000/mm3    MPV 9.9 6.0 - 10.0 fL    RDW-SD 49.8 (H) 35.1 - 43.9      NRBC 0 0 - 0      IMMATURE GRANULOCYTES 0.2 0.0 - 3.0 %    NEUTROPHILS 54.9 34 - 64 %    LYMPHOCYTES 27.5 (L) 28 - 48 %    MONOCYTES 13.8 (H) 1 - 13 %    EOSINOPHILS 2.8 0 - 5 %    BASOPHILS 0.8 0 - 3 %   METABOLIC PANEL, BASIC    Collection Time: 02/25/18  7:55 PM   Result Value Ref Range    Sodium 141 136 - 145 mEq/L     Potassium 3.9 3.5 - 5.1 mEq/L    Chloride 105 98 - 107 mEq/L    CO2 29 21 - 32 mEq/L    Glucose 89 74 - 106 mg/dl    BUN 13 7 - 25 mg/dl    Creatinine 1.2 0.6 - 1.3 mg/dl    GFR est AA >78>60      GFR est non-AA >60      Calcium 8.9 8.5 - 10.1 mg/dl    Anion gap 7 5 - 15 mmol/L   TROPONIN I    Collection Time: 02/25/18  7:55 PM   Result Value Ref Range    Troponin-I <0.015 0.000 - 0.045 ng/ml     Labs Reviewed   CBC WITH AUTOMATED DIFF - Abnormal; Notable for the following components:  Result Value    RDW-SD 49.8 (*)     LYMPHOCYTES 27.5 (*)     MONOCYTES 13.8 (*)     All other components within normal limits   METABOLIC PANEL, BASIC   TROPONIN I       Imaging:    Xr Chest Sngl V    Result Date: 02/25/2018  INDICATION: Chest pain.     TECHNIQUE: PA/lateral Chest Radiograph. COMPARISON: 05/13/2014     IMPRESSION: Heart size is normal. Lungs are clear and well aerated. No focal consolidation.     Xr Shoulder Rt Ap/lat Min 2 V    Result Date: 02/25/2018  INDICATION: Right shoulder pain.      COMPARISON: 05/15/2014 TECHNIQUE: 3views right shoulder     IMPRESSION: There is suboptimal patient positioning. On Y view, there is question of mild anterior subluxation at the glenohumeral joint. Please correlate clinically. No evidence of acute fracture.        EKG: Sinus tachycardia at 115 bpm. PR int. 152 ms, QTc int. 439 ms. No acute ST or T-wave abnormalities that are consistent with acute ischemia or infarction per ED attending.       ED Course/Medical Decision Making   Patient's chart was reviewed. Patient was evaluated at [facility] this morning for complaints of near syncope and right shoulder injury. Labs, EKG, and XRs were performed which showed no acute findings. Given suspiciously similar story, chart was further reviewed which shows countless ED visits to hospitals in several different states for the same complaints of chest pain, dizziness, and right shoulder injuries in which  patient almost always leaves AMA when further assessment is recommended. Suspicions for malingering and drug seeking behavior have been well documented. There are several notes stating subsequent cardiac catheterizations after alleged stents were placed indicate no stents seen. I am unable to find any documentation of actual pathology to include self reported hx of CAD, PE, ALL, brain tumor, CVA, seizure disorder.     Shoulder XR shows questionable mild anterior subluxation at the glenohumeral joint. Labs tonight shows negative troponin and no acute abnormalities seen on EKG concerning for acute ischemia/infarction. Remainder of labs are unremarkable.     Results were discussed with patient. I asked the patient about his visit to [facility] this morning and he states the same thing happened to him this morning and notes "I just keeps getting dizzy and almost passing out" which he states has been ongoing for several months. He seemed frustrated at that time and said he wanted to leave AMA and started taking off his blood pressure cuff and pulse ox, suddenly using the right arm without difficulty. Discussed with patient, given his (alleged) history, we do not advise he leaves at this time however he insists he is leaving. Patient verbalized understanding of his choice to leave AMA and signed AMA form. Patient removed his IV and disposed of it in the sharps container while I was in the room with the patient.       Medications - No data to display    Final Diagnosis       ICD-10-CM ICD-9-CM   1. Chest pain, unspecified type R07.9 786.50   2. Pain in joint of right shoulder M25.511 719.41       Disposition   AMA    Discharge Medication List as of 02/25/2018  9:35 PM          Patient has been evaluated by myself and discussed with [facility], Jonelle Sports, MD who  agrees with the above assessment and plan.    Caren Griffins, PA-C  February 26, 2018  8:31 PM      My signature above authenticates this document and my orders, the final  diagnosis (es), discharge prescription (s), and instructions in the Epic record.  If you have any questions please contact [phone].     Nursing notes have been reviewed by the physician/ advanced practice    Clinician.

## 2018-02-26 LAB — METABOLIC PANEL, BASIC
Anion gap: 7 mmol/L (ref 5–15)
BUN: 13 mg/dl (ref 7–25)
CO2: 29 mEq/L (ref 21–32)
Calcium: 8.9 mg/dl (ref 8.5–10.1)
Chloride: 105 mEq/L (ref 98–107)
Creatinine: 1.2 mg/dl (ref 0.6–1.3)
GFR est AA: >60
GFR est non-AA: >60
Glucose: 89 mg/dl (ref 74–106)
Potassium: 3.9 mEq/L (ref 3.5–5.1)
Sodium: 141 mEq/L (ref 136–145)

## 2018-02-26 LAB — CBC WITH AUTOMATED DIFF
BASOPHILS: 0.8 % (ref 0–3)
EOSINOPHILS: 2.8 % (ref 0–5)
HCT: 40.2 % (ref 37.0–50.0)
HGB: 13 gm/dl (ref 12.4–17.2)
IMMATURE GRANULOCYTES: 0.2 % (ref 0.0–3.0)
LYMPHOCYTES: 27.5 % — ABNORMAL LOW (ref 28–48)
MCH: 30.4 pg (ref 23.0–34.6)
MCHC: 32.3 gm/dl (ref 30.0–36.0)
MCV: 93.9 fL (ref 80.0–98.0)
MONOCYTES: 13.8 % — ABNORMAL HIGH (ref 1–13)
MPV: 9.9 fL (ref 6.0–10.0)
NEUTROPHILS: 54.9 % (ref 34–64)
NRBC: 0 (ref 0–0)
PLATELET: 256 10*3/uL (ref 140–450)
RBC: 4.28 M/uL (ref 3.80–5.70)
RDW-SD: 49.8 — ABNORMAL HIGH (ref 35.1–43.9)
WBC: 6.4 10*3/uL (ref 4.0–11.0)

## 2018-02-26 LAB — TROPONIN I: Troponin-I: <0.015 ng/ml (ref 0.000–0.045)

## 2018-02-26 LAB — EKG, 12 LEAD, INITIAL
Atrial Rate: 115 {beats}/min
Calculated P Axis: 45 degrees
Calculated R Axis: 50 degrees
Calculated T Axis: 15 degrees
P-R Interval: 152 ms
Q-T Interval: 318 ms
QRS Duration: 100 ms
QTC Calculation (Bezet): 439 ms
Ventricular Rate: 115 {beats}/min

## 2018-02-26 LAB — CBC WITH AUTO DIFFERENTIAL
Basophils %: 0.8 % (ref 0–3)
Eosinophils %: 2.8 % (ref 0–5)
Hematocrit: 40.2 % (ref 37.0–50.0)
Hemoglobin: 13 gm/dl (ref 12.4–17.2)
Immature Granulocytes: 0.2 % (ref 0.0–3.0)
Lymphocytes %: 27.5 % — ABNORMAL LOW (ref 28–48)
MCH: 30.4 pg (ref 23.0–34.6)
MCHC: 32.3 gm/dl (ref 30.0–36.0)
MCV: 93.9 fL (ref 80.0–98.0)
MPV: 9.9 fL (ref 6.0–10.0)
Monocytes %: 13.8 % — ABNORMAL HIGH (ref 1–13)
Neutrophils %: 54.9 % (ref 34–64)
Nucleated RBCs: 0 (ref 0–0)
Platelets: 256 10*3/uL (ref 140–450)
RBC: 4.28 M/uL (ref 3.80–5.70)
RDW-SD: 49.8 — ABNORMAL HIGH (ref 35.1–43.9)
WBC: 6.4 10*3/uL (ref 4.0–11.0)

## 2018-02-26 LAB — EKG 12-LEAD
Atrial Rate: 115 {beats}/min
P Axis: 45 degrees
P-R Interval: 152 ms
Q-T Interval: 318 ms
QRS Duration: 100 ms
QTc Calculation (Bazett): 439 ms
R Axis: 50 degrees
T Axis: 15 degrees
Ventricular Rate: 115 {beats}/min

## 2018-02-26 LAB — BASIC METABOLIC PANEL
Anion Gap: 7 mmol/L (ref 5–15)
BUN: 13 mg/dl (ref 7–25)
CO2: 29 mEq/L (ref 21–32)
Calcium: 8.9 mg/dl (ref 8.5–10.1)
Chloride: 105 mEq/L (ref 98–107)
Creatinine: 1.2 mg/dl (ref 0.6–1.3)
EGFR IF NonAfrican American: >60
GFR African American: >60
Glucose: 89 mg/dl (ref 74–106)
Potassium: 3.9 mEq/L (ref 3.5–5.1)
Sodium: 141 mEq/L (ref 136–145)

## 2018-02-26 LAB — TROPONIN: Troponin I: <0.015 ng/ml (ref 0.000–0.045)

## 2018-06-03 IMAGING — DX DG CHEST 2V
2 series · 2 of 2 positions shown · non-contrast
Comparison: Radiographs 01/15/2016

CLINICAL DATA: Chest pain for 2 hours with shortness of breath.
Left-sided pain with left arm and flank pain.

EXAM:
CHEST  2 VIEW

[chest pa]
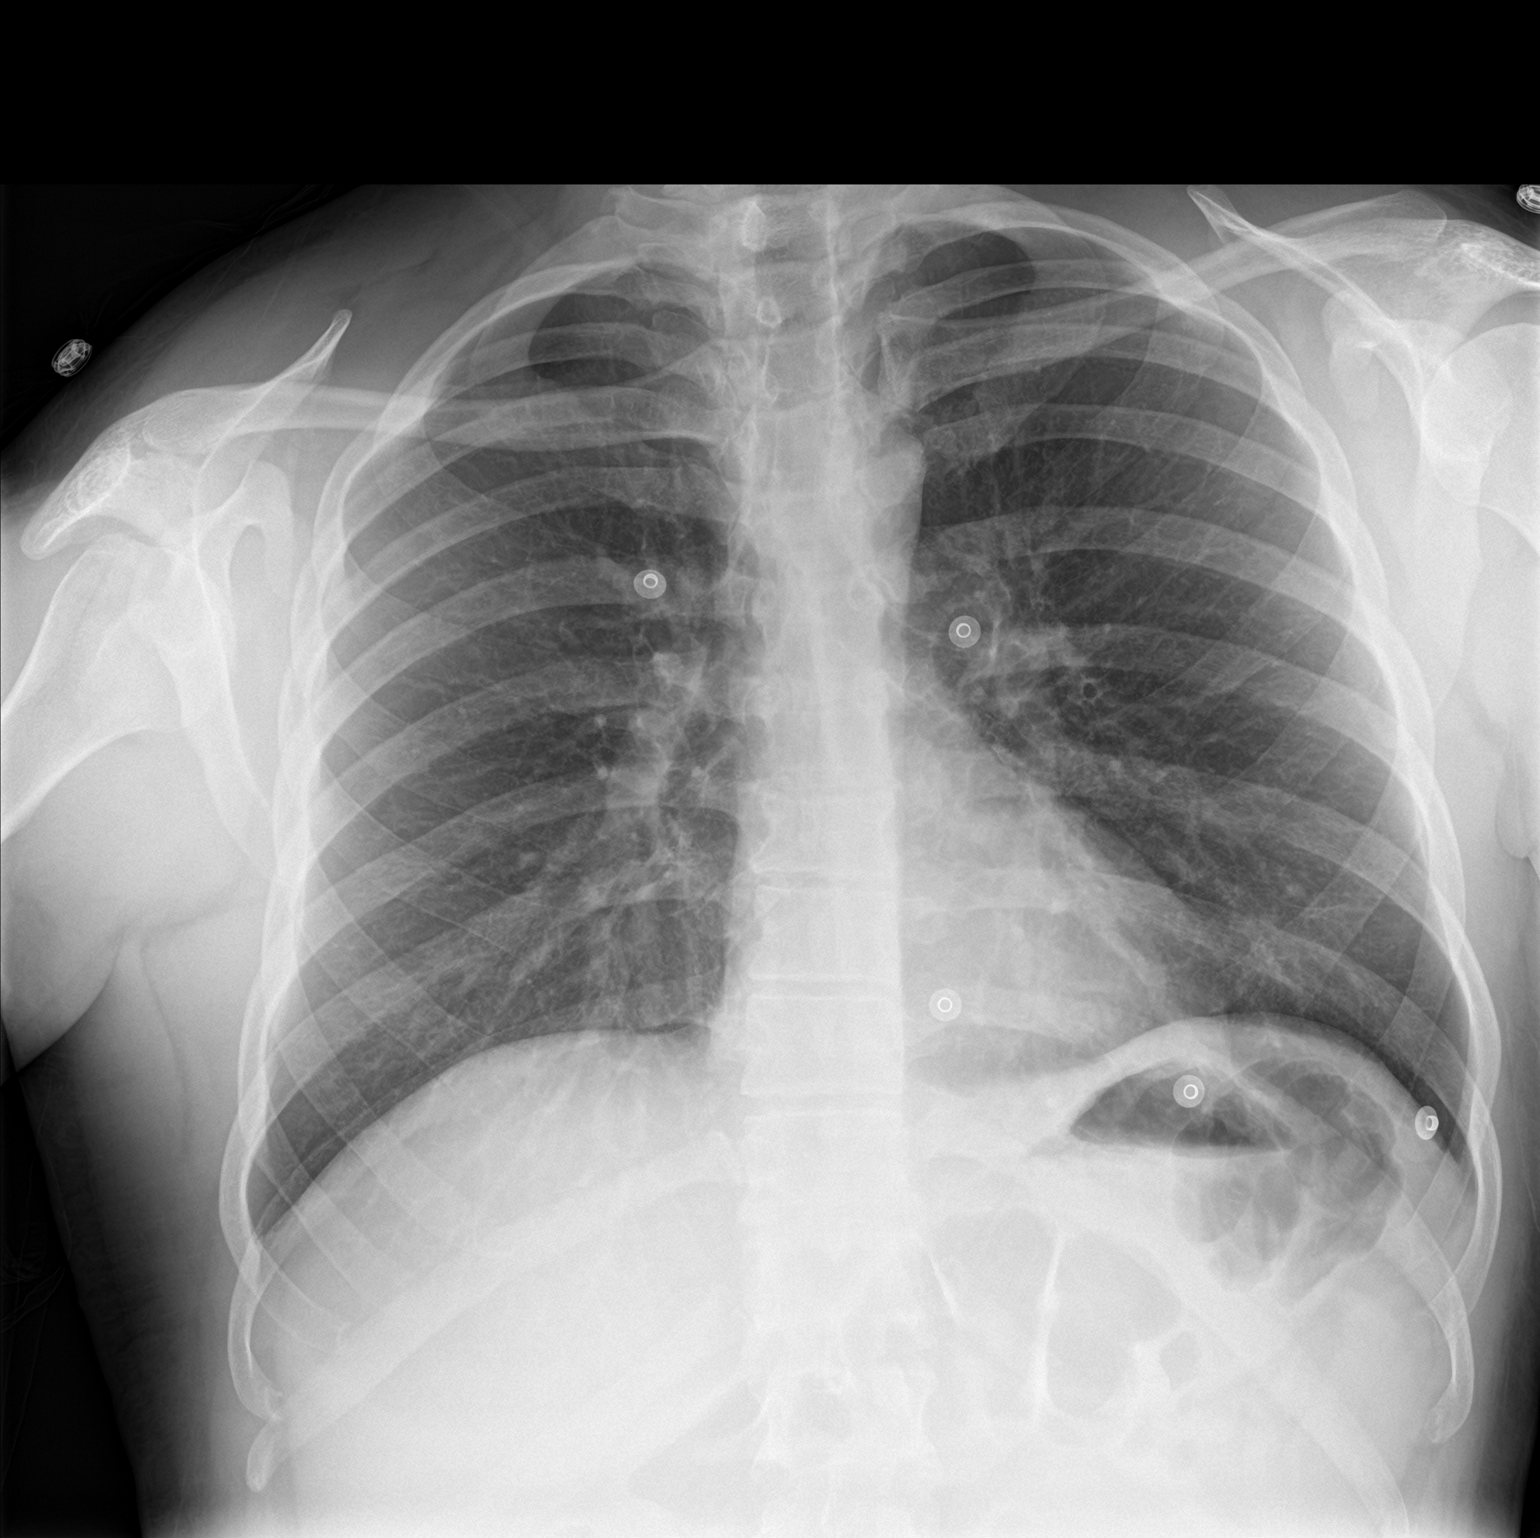

[chest lat]
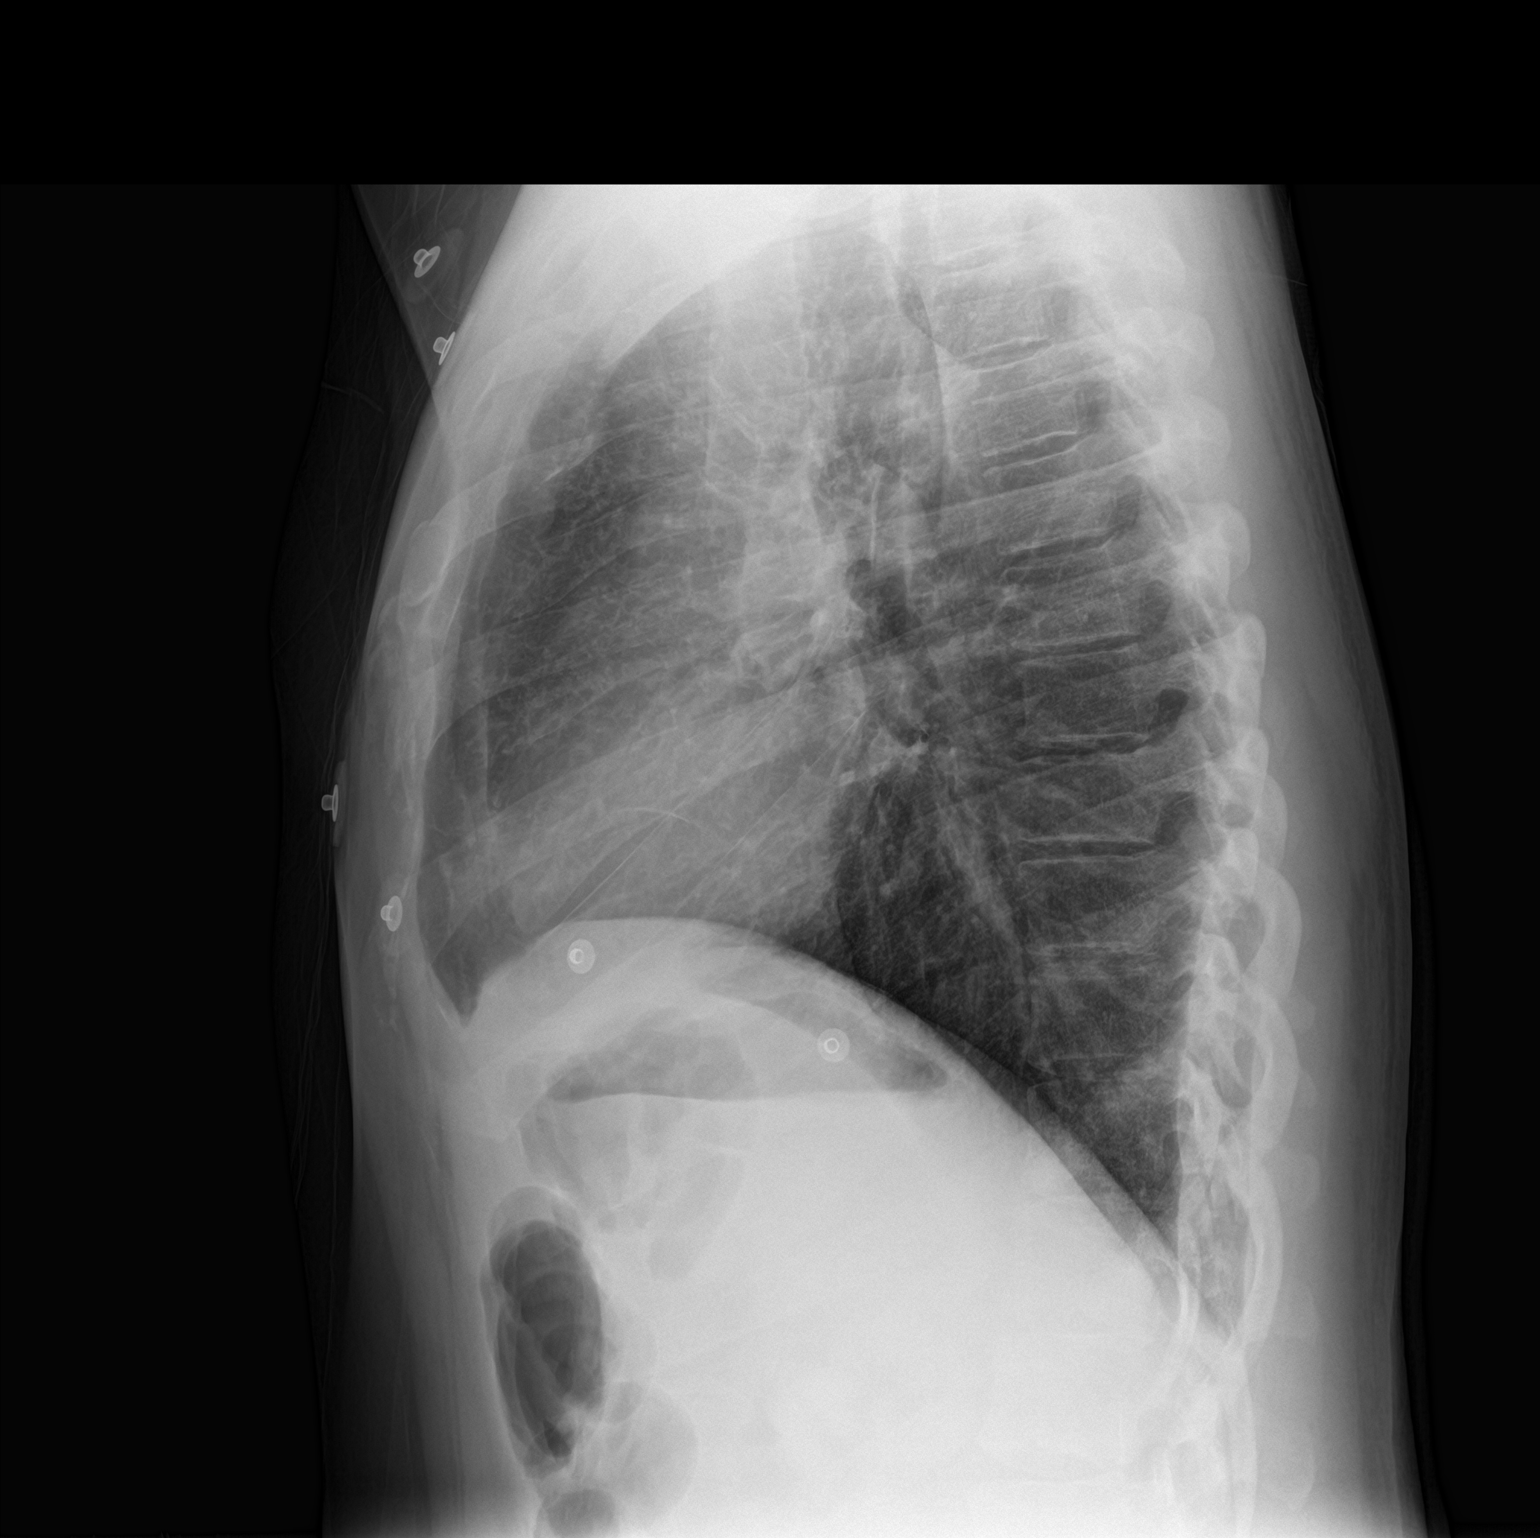

[2 of 2 positions shown; findings below may reference images not displayed]

FINDINGS: The cardiomediastinal contours are normal. The lungs are clear.
Pulmonary vasculature is normal. No consolidation, pleural effusion,
or pneumothorax. No acute osseous abnormalities are seen.
IMPRESSION: No acute pulmonary process.

## 2018-06-13 ENCOUNTER — Emergency Department
Admission: EM | Admit: 2018-06-13 | Discharge: 2018-06-13 | Discharge status: Against Medical Advice | Payer: Medicare (Managed Care) | Attending: Emergency Medicine | Admitting: Emergency Medicine

## 2018-06-13 ENCOUNTER — Emergency Department: Payer: Medicare (Managed Care)

## 2018-06-13 ENCOUNTER — Encounter: Payer: Self-pay | Admitting: Emergency Medicine

## 2018-06-13 DIAGNOSIS — Z5329 Procedure and treatment not carried out because of patient's decision for other reasons: Secondary | ICD-10-CM | POA: Insufficient documentation

## 2018-06-13 DIAGNOSIS — M25512 Pain in left shoulder: Secondary | ICD-10-CM | POA: Diagnosis not present

## 2018-06-13 HISTORY — DX: Atherosclerotic heart disease of native coronary artery without angina pectoris: I25.10

## 2018-06-13 NOTE — ED Notes (Signed)
Pt appears increasingly agitated after conversation with MD. Pt can be heard from nurse's station swearing and yelling at MD. Pt states he is not staying here. Pt allowed PIV placed by [facility] to be removed by Binnie Rail, RN. Pt refused to sign AMA form. Pt put his own shirt on without assistance or difficulty and left with steady gait.

## 2018-06-13 NOTE — ED Notes (Signed)
Pt arrives via [facility] with c/c of L shoulder pain s/p falling in the shower this AM. EMS reports giving Pt phentonyl IV approx 15 min prior to arrival. Pt requested more IV pain medication when radiology arrived to xray his shoulder. Pt became highly agitated when Dr Cherylann Banas declined to give more pain medication until he could review the images and conduct an assessment. He stated that he was leaving and threatened the EDP with legal action and shouted "you are about to get it, you fucked with the wrong one today". The patient then stated that nobody here could touch him and at first refused to let this nurse remove the IV in his R hand. When it was explained that he would not be allowed to leave unless it was removed, he allowed this nurse to remove it. He then put his shirt back on without assistance, refused to sign the AMA release and was escorted to the lobby by [facility] PD.

## 2018-06-13 NOTE — ED Provider Notes (Signed)
[facility] Emergency Department Provider Note ____________________________________________   First MD Initiated Contact with Patient 06/13/18 1033     (approximate)  I have reviewed the triage vital signs and the nursing notes.   HISTORY  Chief Complaint Dizziness and Fall  Level 5 caveat: History of present illness limited by uncooperative behavior  HPI Alex Sandoval is a 32 y.o. male with PMH as noted below who presents with left shoulder pain.  The patient states that he passed out and fell, causing him to dislocate his left shoulder.  He states this has happened multiple times before.   Past Medical History:  Diagnosis Date  . Confabulation   . Coronary artery disease   . Drug abuse ([place])   . History of pseudoseizure   . Hypertension   . Malingering   . Personality disorder ([place])   . Post traumatic stress disorder   . Seizures ([place])   . Shoulder dislocation, recurrent    SELF-INDUCED IN ATTEMPT TO OBTAIN OPIATES    Patient Active Problem List   Diagnosis Date Noted  . Opiate abuse, continuous ([place]) 01/23/2012    Class: Acute  . Benzodiazepine abuse, continuous ([place]) 01/23/2012    Class: Acute    Past Surgical History:  Procedure Laterality Date  . APPENDECTOMY      Prior to Admission medications   Medication Sig Start Date End Date Taking? Authorizing Provider  aspirin EC 81 MG tablet Take 81 mg by mouth daily.    [provider]  atorvastatin (LIPITOR) 20 MG tablet Take 20 mg by mouth at bedtime.    [provider]  cloNIDine (CATAPRES) 0.1 MG tablet Take 0.1 mg by mouth daily.     [provider]  clopidogrel (PLAVIX) 75 MG tablet Take 75 mg by mouth daily.    [provider]  metoprolol (LOPRESSOR) 50 MG tablet Take 50 mg by mouth 2 (two) times daily.    [provider]  naproxen (NAPROSYN) 500 MG tablet Take 1 tablet (500 mg total) by mouth 2 (two) times daily with a  meal. Patient not taking: Reported on 03/04/2016 02/15/16   Lavonia Drafts, MD  nitroGLYCERIN (NITROSTAT) 0.4 MG SL tablet Place 0.4 mg under the tongue every 5 (five) minutes as needed for chest pain.    [provider]    Allergies Benadryl [diphenhydramine hcl] and Penicillins  No family history on file.  Social History Social History   Tobacco Use  . Smoking status: Former Smoker    Packs/day: 0.50    Years: 10.00    Pack years: 5.00    Types: Cigarettes    Last attempt to quit: 01/30/2016    Years since quitting: 2.3  . Smokeless tobacco: Never Used  Substance Use Topics  . Alcohol use: No  . Drug use: Yes    Frequency: 3.0 times per week    Comment: hx of opiate and benzo addiction/abuse    Review of Systems Level 5 caveat: Unable to obtain review of systems due to uncooperative behavior    ____________________________________________   PHYSICAL EXAM:  VITAL SIGNS: ED Triage Vitals  Enc Vitals Group     BP --      Pulse --      Resp --      Temp --      Temp src --      SpO2 --      Weight 06/13/18 1019 179 lb 14.3 oz (81.6 kg)  Height 06/13/18 1019 6' (1.829 m)     Head Circumference --      Peak Flow --      Pain Score 06/13/18 1018 8     Pain Loc --      Pain Edu? --      Excl. in [place]? --     Constitutional: Alert and oriented.  Comfortable appearing and in no acute distress. Eyes: Conjunctivae are normal.  Head: Atraumatic. Nose: No congestion/rhinnorhea. Mouth/Throat: Mucous membranes are moist.   Cardiovascular: Good peripheral circulation. Respiratory: Normal respiratory effort.  Gastrointestinal: No distention.  Musculoskeletal: Extremities warm and well perfused.  Patient holding left arm in internal rotation.  No deformity. Neurologic:  Normal speech and language. No gross focal neurologic deficits are appreciated.  Skin:  Skin is warm and dry. No rash noted. Psychiatric: Angry appearing and  agitated.  ____________________________________________   LABS (all labs ordered are listed, but only abnormal results are displayed)  Labs Reviewed  BASIC METABOLIC PANEL  CBC  URINALYSIS, COMPLETE (UACMP) WITH MICROSCOPIC  CBG MONITORING, ED   ____________________________________________  EKG  ED ECG REPORT I, Arta Silence, the attending physician, personally viewed and interpreted this ECG.  Date: 06/13/2018 EKG Time: 1020 Rate:  Rhythm: normal sinus rhythm QRS Axis: normal Intervals: normal ST/T Wave abnormalities: normal Narrative Interpretation: no evidence of acute ischemia  ____________________________________________  RADIOLOGY  XR L shoulder: Patient declined  ____________________________________________   PROCEDURES  Procedure(s) performed: No  Procedures  Critical Care performed: No ____________________________________________   INITIAL IMPRESSION / ASSESSMENT AND PLAN / ED COURSE  Pertinent labs & imaging results that were available during my care of the patient were reviewed by me and considered in my medical decision making (see chart for details).  32 year old male with PMH as noted below including apparent history of recurrent shoulder dislocations presents stating that he syncopized and dislocated his left shoulder.  Prior to evaluating the patient I reviewed his past medical records in [place].  Although he has not been seen in the ED here at [facility] since 2017, the patient has had numerous visits to other ED is in the [place] over the last several months with similar presentations:  06/08/2018 [facility] 06/04/2018 [facility] 05/17/18 [facility] health system 05/03/2018 [place] 04/15/2018 [place]  04/14/2018 [place] 03/25/2018 [facility]  From the note on 9/3 at [place], where the patient was diagnosed with malingering:   Case discussed with Dr. Venia Minks. Review of care everywhere  indicates that he has had about a dozen visits to outside hospitals across multiple states for chest pain, syncope, right or left shoulder pain with the presumption of subluxation. Multiple of these visits resulted in a diagnosis of malingering. He tells me his stents were placed to the hospital in [place] 8 weeks ago but other hospitals at recent visit say that they were placed at [facility] and I see no records from [facility] regarding any kind of cardiac procedures being done. Records from an ED visit at [facility] indicate that they completed an echo on him just about 10 days ago with an EF of 65 and no wall motion abnormalities. I confronted him on the care everywhere findings at which point the patient became very upset and opted to leave AMA. We did get an x-ray of his shoulder before I had confronted him on this and at least a wet read of it does not like dislocated we did get an EKG also  before he became upset which was not concerning for acute ischemia.  Per his RN when she went to give him the [facility] paperwork he proceeded to say that he would be back later tonight with homicidal ideation and that I better watch out for him. At that point I asked for him to be arrested or at a minimum placed on an IVC and brought back. The police did go and confront the patient, they did not feel that an emergent IVC could be completed by [place] since he was already out of the ED they asked if I wanted to proceed with placing an IVC to the magistrate I discussed with them and they said family was coming to pick the patient up so I said that as long is our concerns are expressed to the patient's family as well I would be comfortable with him going home with family.   From the note on 11 8 at [facility]:  While I do not feel this was true near-syncope, I will do screening cardiac labs. With regard to his shoulder pain. I am able to passively range the shoulder. X-ray does not show its location or fracture. There has  been documented history of malingering many times in the past. I do sense that there may be some secondary gain issues at play. With regard to his shoulder pain I have advised that nothing further is indicated at this time other than sling and outpatient orthopedic follow-up.  I did more investigation trying to confirm the patient's past medical history. He told me that he received treatment for his leukemia through [facility]. I see no diagnosis of leukemia in the chart. His last visit in the [facility] health system was an ED visit in October 2017 for shoulder pauin. I tried to find records confirming his reported coronary artery disease. Last cardiac workup that I find was in [place] in May 2017. He had a normal cardiac catheterization then. Furthermore, those records indicate that previous cardiologists had not been able to find records of coronary artery disease or prior cath reports. I discussed these additional inconsistencies. I emphasized need for him to be honest and forthcoming with me so that I might determine the appropriate treatment plan. He ultimately became angry and demanded to be discharged. "Just let me leave because all you are doing is accusing me of lying!"   This concerned me that the patient could be presenting with a similar issue today.  When I initially came to evaluate the patient, I asked him what was going on today.  The patient stated that he passed out and dislocated his left shoulder.  Before telling me anything else about why he was here, he began complaining that he was being mistreated and started cursing, saying we were treating him "like shit."  He complained of pain in the left shoulder.  I was told that he had declined x-ray because he did not like the way that the x-ray technician attempted to move his arm.  I asked him about this and he confirmed it.  I then informed the patient that I had thoroughly reviewed his record and I was concerned about his multiple prior ED visits.   I asked him if he could tell me anything about why he had been to all of these other hospitals over the last few months.  I also informed him that we would not be giving IV pain medication until we could initiate the work-up and try to confirm objective findings such  as a dislocated shoulder.  The patient immediately appeared very angry, stated that he was leaving, and that he would call his lawyer; claimed that I was accusing him of something.  I repeatedly pointed out to him that I was only attempting to get information from to find out more about his medical issues and what was going on with him today, and that I was not accusing him of anything.  The patient became increasingly confrontational.  As I tried to redirect the conversation to his acute medical issues, he again stated he was leaving.  He called me a "prick" multiple times.  He repeatedly told me that he was calling his lawyer and that he would "have my license" by the end of the week.  I reminded the patient multiple times that we were attempting to evaluate him and help him, and that as he continued to curse and behave in an inappropriate and confrontational manner he was interfering with his own care.  Patient repeatedly said to me "you are this close" making a pinching motion with his hand in a manner which I interpreted as acutely threatening.    I informed the patient that we would be happy to help him and continue with the work-up if he wanted to proceed with it, however I stated that since he was actively threatening me I would leave the room until he was able to calm down.  I explained to the patient before leaving the room that if he left it would be against medical advice, that without doing any work-up I could not rule out shoulder dislocation or other acute emergency, and that he could return at any time if he changed his mind and wish to resume his work-up.   The patient then walked out of the ED.  He declined to stay for any  discharge paperwork.  He was observed by the RN using his left shoulder and putting on his shirt without difficulty.  ____________________________________________   FINAL CLINICAL IMPRESSION(S) / ED DIAGNOSES  Final diagnoses:  Acute pain of left shoulder      NEW MEDICATIONS STARTED DURING THIS VISIT:  Discharge Medication List as of 06/13/2018 10:53 AM       Note:  This document was prepared using Dragon voice recognition software and may include unintentional dictation errors.    Arta Silence, MD 06/13/18 1120

## 2018-06-13 NOTE — ED Notes (Signed)
Pt refusing xray to be done. States he will get up and leave if not given pain meds immediately. EDP Siadecki notified. EDP to bedside to speak with patient.

## 2018-06-13 NOTE — ED Triage Notes (Signed)
fell out of shower after being dizzy.  c/o left shoulder pain / displaced.  Patient has history of dislocation to the left shoulder.  20 g IV to left hand.  50 mcg fentanyl given PTA.  Denies LOC.  Patient takes elequis

## 2019-01-02 ENCOUNTER — Telehealth: Payer: Self-pay

## 2019-01-02 NOTE — Telephone Encounter (Signed)
Called to to covid-19 screening for appointment tomorrow. No answer and no voicemail picked up.

## 2019-01-03 ENCOUNTER — Ambulatory Visit: Payer: Medicare (Managed Care) | Admitting: Family Medicine

## 2021-03-15 ENCOUNTER — Encounter (HOSPITAL_COMMUNITY): Payer: Self-pay | Admitting: *Deleted

## 2021-03-15 ENCOUNTER — Emergency Department (EMERGENCY_DEPARTMENT_HOSPITAL): Payer: Medicare Other

## 2021-03-15 ENCOUNTER — Emergency Department (HOSPITAL_COMMUNITY)
Admission: EM | Admit: 2021-03-15 | Discharge: 2021-03-15 | Disposition: A | Discharge status: Home / Self Care | Payer: Medicare Other | Attending: Student | Admitting: Student

## 2021-03-15 ENCOUNTER — Emergency Department (HOSPITAL_COMMUNITY): Payer: Medicare Other

## 2021-03-15 DIAGNOSIS — I4891 Unspecified atrial fibrillation: Secondary | ICD-10-CM | POA: Diagnosis not present

## 2021-03-15 DIAGNOSIS — Z79899 Other long term (current) drug therapy: Secondary | ICD-10-CM | POA: Insufficient documentation

## 2021-03-15 DIAGNOSIS — M7989 Other specified soft tissue disorders: Secondary | ICD-10-CM | POA: Insufficient documentation

## 2021-03-15 DIAGNOSIS — R079 Chest pain, unspecified: Secondary | ICD-10-CM | POA: Diagnosis not present

## 2021-03-15 DIAGNOSIS — R55 Syncope and collapse: Secondary | ICD-10-CM | POA: Diagnosis not present

## 2021-03-15 DIAGNOSIS — Z87891 Personal history of nicotine dependence: Secondary | ICD-10-CM | POA: Insufficient documentation

## 2021-03-15 DIAGNOSIS — R609 Edema, unspecified: Secondary | ICD-10-CM

## 2021-03-15 DIAGNOSIS — M25512 Pain in left shoulder: Secondary | ICD-10-CM | POA: Diagnosis present

## 2021-03-15 DIAGNOSIS — Z7982 Long term (current) use of aspirin: Secondary | ICD-10-CM | POA: Diagnosis not present

## 2021-03-15 DIAGNOSIS — M25519 Pain in unspecified shoulder: Secondary | ICD-10-CM

## 2021-03-15 DIAGNOSIS — I10 Essential (primary) hypertension: Secondary | ICD-10-CM | POA: Diagnosis not present

## 2021-03-15 DIAGNOSIS — Z7901 Long term (current) use of anticoagulants: Secondary | ICD-10-CM | POA: Diagnosis not present

## 2021-03-15 DIAGNOSIS — I2510 Atherosclerotic heart disease of native coronary artery without angina pectoris: Secondary | ICD-10-CM | POA: Diagnosis not present

## 2021-03-15 LAB — CBC WITH DIFFERENTIAL/PLATELET
Abs Immature Granulocytes: 0.01 10*3/uL (ref 0.00–0.07)
Basophils Absolute: 0 10*3/uL (ref 0.0–0.1)
Basophils Relative: 1 %
Eosinophils Absolute: 0.3 10*3/uL (ref 0.0–0.5)
Eosinophils Relative: 7 %
HCT: 34.5 % — ABNORMAL LOW (ref 39.0–52.0)
Hemoglobin: 11.4 g/dL — ABNORMAL LOW (ref 13.0–17.0)
Immature Granulocytes: 0 %
Lymphocytes Relative: 19 %
Lymphs Abs: 0.8 10*3/uL (ref 0.7–4.0)
MCH: 32.1 pg (ref 26.0–34.0)
MCHC: 33 g/dL (ref 30.0–36.0)
MCV: 97.2 fL (ref 80.0–100.0)
Monocytes Absolute: 0.3 10*3/uL (ref 0.1–1.0)
Monocytes Relative: 8 %
Neutro Abs: 2.7 10*3/uL (ref 1.7–7.7)
Neutrophils Relative %: 65 %
Platelets: 202 10*3/uL (ref 150–400)
RBC: 3.55 MIL/uL — ABNORMAL LOW (ref 4.22–5.81)
RDW: 13.8 % (ref 11.5–15.5)
WBC: 4.1 10*3/uL (ref 4.0–10.5)
nRBC: 0 % (ref 0.0–0.2)

## 2021-03-15 LAB — BASIC METABOLIC PANEL
Anion gap: 6 (ref 5–15)
BUN: 12 mg/dL (ref 6–20)
CO2: 26 mmol/L (ref 22–32)
Calcium: 8.9 mg/dL (ref 8.9–10.3)
Chloride: 105 mmol/L (ref 98–111)
Creatinine, Ser: 0.81 mg/dL (ref 0.61–1.24)
GFR, Estimated: >60 mL/min (ref >60)
Glucose, Bld: 101 mg/dL — ABNORMAL HIGH (ref 70–99)
Potassium: 3.8 mmol/L (ref 3.5–5.1)
Sodium: 137 mmol/L (ref 135–145)

## 2021-03-15 LAB — TROPONIN I (HIGH SENSITIVITY): Troponin I (High Sensitivity): 4 ng/L (ref <18)

## 2021-03-15 MED ORDER — KETOROLAC TROMETHAMINE 15 MG/ML IJ SOLN
15.0000 mg | Freq: Once | INTRAMUSCULAR | Status: DC
  Filled 2021-03-15: qty 1

## 2021-03-15 MED ORDER — HYDROMORPHONE HCL 1 MG/ML IJ SOLN
1.0000 mg | Freq: Once | INTRAMUSCULAR | Status: AC
Start: 2021-03-15 — End: 2021-03-15
  Administered 2021-03-15: 1 mg via INTRAMUSCULAR
  Filled 2021-03-15: qty 1

## 2021-03-15 NOTE — ED Provider Notes (Signed)
Of both [place] DEPT Provider Note   CSN: [id number] Arrival date & time: 03/15/21  1235     History Chief Complaint  Patient presents with   Shoulder Pain    Alex Sandoval is a 35 y.o. male with PMH A. fib on Eliquis, HTN, pseudoseizure, polysubstance abuse, recurrent shoulder dislocation with potential for secondary gain presentations with self-induced shoulder dislocation who presents to the emergency department for evaluation of left shoulder pain, chest pain, right lower extremity swelling.  Patient states that he fell off of a roof yesterday and was seen at [facility] health MRN [id number] and had his shoulder relocated.  He states that today he had an episode of sudden onset chest pain, shortness of breath followed by syncope where he landed on outstretched left arm and redislocated his shoulder.  He also states that his right lower extremity has swollen up and is tender to palpation.  He states this is abnormal for him and he is confused as to why this is happening because he has been compliant with his Eliquis.  He currently denies chest pain, shortness of breath, abdominal pain, nausea, vomiting or other systemic symptoms.   Shoulder Pain Associated symptoms: no back pain and no fever       Past Medical History:  Diagnosis Date   Confabulation    Coronary artery disease    Drug abuse ([place])    History of pseudoseizure    Hypertension    Malingering    Personality disorder ([place])    Post traumatic stress disorder    Seizures ([place])    Shoulder dislocation, recurrent    SELF-INDUCED IN ATTEMPT TO OBTAIN OPIATES    Patient Active Problem List   Diagnosis Date Noted   Opiate abuse, continuous ([place]) 01/23/2012    Class: Acute   Benzodiazepine abuse, continuous ([place]) 01/23/2012    Class: Acute    Past Surgical History:  Procedure Laterality Date   APPENDECTOMY         No family history on file.  Social History   Tobacco Use    Smoking status: Former    Packs/day: 0.50    Years: 10.00    Pack years: 5.00    Types: Cigarettes    Quit date: 01/30/2016    Years since quitting: 5.1   Smokeless tobacco: Never  Substance Use Topics   Alcohol use: No   Drug use: Yes    Frequency: 3.0 times per week    Comment: hx of opiate and benzo addiction/abuse    Home Medications Prior to Admission medications   Medication Sig Start Date End Date Taking? Authorizing Provider  apixaban (ELIQUIS) 5 MG TABS tablet Take 5 mg by mouth in the morning and at bedtime.   Yes [provider]  aspirin EC 81 MG tablet Take 81 mg by mouth daily.   Yes [provider]  Metoprolol Tartrate 75 MG TABS Take 75 mg by mouth in the morning and at bedtime.   Yes [provider]    Allergies    Benadryl [diphenhydramine hcl], Penicillins, and Ketamine  Review of Systems   Review of Systems  Constitutional:  Negative for chills and fever.  HENT:  Negative for ear pain and sore throat.   Eyes:  Negative for pain and visual disturbance.  Respiratory:  Negative for cough and shortness of breath.   Cardiovascular:  Negative for chest pain and palpitations.  Gastrointestinal:  Negative for abdominal pain and vomiting.  Genitourinary:  Negative for dysuria and hematuria.  Musculoskeletal:  Negative for arthralgias and back pain.       Left shoulder pain  Skin:  Negative for color change and rash.  Neurological:  Negative for seizures and syncope.  All other systems reviewed and are negative.  Physical Exam Updated Vital Signs BP 137/87   Pulse 77   Temp 98.2 F (36.8 C) (Oral)   Resp 10   SpO2 100%   Physical Exam Vitals and nursing note reviewed.  Constitutional:      Appearance: He is well-developed.  HENT:     Head: Normocephalic and atraumatic.  Eyes:     Conjunctiva/sclera: Conjunctivae normal.  Cardiovascular:     Rate and Rhythm: Normal rate and regular rhythm.     Heart sounds: No murmur  heard. Pulmonary:     Effort: Pulmonary effort is normal. No respiratory distress.     Breath sounds: Normal breath sounds.  Abdominal:     Palpations: Abdomen is soft.     Tenderness: There is no abdominal tenderness.  Musculoskeletal:        General: Tenderness (Left upper extremity, right lower extremity) present.     Cervical back: Neck supple.  Skin:    General: Skin is warm and dry.  Neurological:     Mental Status: He is alert.    ED Results / Procedures / Treatments   Labs (all labs ordered are listed, but only abnormal results are displayed) Labs Reviewed  CBC WITH DIFFERENTIAL/PLATELET - Abnormal; Notable for the following components:      Result Value   RBC 3.55 (*)    Hemoglobin 11.4 (*)    HCT 34.5 (*)    All other components within normal limits  BASIC METABOLIC PANEL  TROPONIN I (HIGH SENSITIVITY)  TROPONIN I (HIGH SENSITIVITY)    EKG None  Radiology DG Shoulder Left  Result Date: 03/15/2021 CLINICAL DATA:  Dislocation. EXAM: LEFT SHOULDER - 2+ VIEW COMPARISON:  None. FINDINGS: Study limited by patient positioning. Within this limitation, no gross shoulder dislocation evident. No findings of acute fracture. No worrisome lytic or sclerotic osseous abnormality. IMPRESSION: Limited study due to patient positioning. Within this limitation, no gross shoulder dislocation evident. Electronically Signed   By: Misty Stanley M.D.   On: 03/15/2021 15:10   VAS [place] LOWER EXTREMITY VENOUS (DVT) (ONLY [facility] & [facility] 7a-7p)  Result Date: 03/15/2021  Lower Venous DVT Study Patient Name:  Alex Sandoval  Date of Exam:   03/15/2021 Medical Rec #: [id number]          Accession #:    [id number] Date of Birth: 1986-04-25           Patient Gender: M Patient Age:   37 years Exam Location:  [facility] Procedure:      VAS [place] LOWER EXTREMITY VENOUS (DVT) Referring Phys: Charmaine Downs --------------------------------------------------------------------------------  Indications: Edema.   Risk Factors: Trauma. Comparison Study: No prior studies. Performing Technologist: Oliver Hum RVT  Examination Guidelines: A complete evaluation includes B-mode imaging, spectral Doppler, color Doppler, and power Doppler as needed of all accessible portions of each vessel. Bilateral testing is considered an integral part of a complete examination. Limited examinations for reoccurring indications may be performed as noted. The reflux portion of the exam is performed with the patient in reverse Trendelenburg.  +---------+---------------+---------+-----------+----------+--------------+ RIGHT    CompressibilityPhasicitySpontaneityPropertiesThrombus Aging +---------+---------------+---------+-----------+----------+--------------+ CFV      Full           Yes  Yes                                 +---------+---------------+---------+-----------+----------+--------------+ SFJ      Full                                                        +---------+---------------+---------+-----------+----------+--------------+ FV Prox  Full                                                        +---------+---------------+---------+-----------+----------+--------------+ FV Mid   Full                                                        +---------+---------------+---------+-----------+----------+--------------+ FV DistalFull                                                        +---------+---------------+---------+-----------+----------+--------------+ PFV      Full                                                        +---------+---------------+---------+-----------+----------+--------------+ POP      Full           Yes      Yes                                 +---------+---------------+---------+-----------+----------+--------------+ PTV      Full                                                         +---------+---------------+---------+-----------+----------+--------------+ PERO     Full                                                        +---------+---------------+---------+-----------+----------+--------------+   +----+---------------+---------+-----------+----------+--------------+ LEFTCompressibilityPhasicitySpontaneityPropertiesThrombus Aging +----+---------------+---------+-----------+----------+--------------+ CFV Full           Yes      Yes                                 +----+---------------+---------+-----------+----------+--------------+     Summary: RIGHT: - There is no evidence of deep vein thrombosis in the lower extremity.  -  No cystic structure found in the popliteal fossa.  LEFT: - No evidence of common femoral vein obstruction.  *See table(s) above for measurements and observations.    Preliminary     Procedures Procedures   Medications Ordered in ED Medications  ketorolac (TORADOL) 15 MG/ML injection 15 mg (has no administration in time range)  HYDROmorphone (DILAUDID) injection 1 mg (1 mg Intramuscular Given 03/15/21 1411)    ED Course  I have reviewed the triage vital signs and the nursing notes.  Pertinent labs & imaging results that were available during my care of the patient were reviewed by me and considered in my medical decision making (see chart for details).    MDM Rules/Calculators/A&P                           Patient seen in the emergency department for evaluation of complaints including chest pain, syncope, left shoulder pain with concern for dislocation, and right lower extremity swelling with concern for DVT.  Physical exam reveals left shoulder tenderness but full range of motion of the shoulder.  Mild right lower extremity asymmetry swelling.  Cardiopulmonary exam unremarkable.  During my examination, patient experiencing behaviors of splitting and repeatedly asking for IV pain medication.  Initial x-ray with no shoulder  dislocation.  Right lower extremity DVT ultrasound is currently pending and patient was signed out to oncoming provider.  Please see provider signout note for continuation of work-up. Final Clinical Impression(s) / ED Diagnoses Final diagnoses:  None    Rx / DC Orders ED Discharge Orders     None        Trenden Hazelrigg, Debe Coder, MD 03/15/21 [phone]

## 2021-03-15 NOTE — Progress Notes (Signed)
Right lower extremity venous duplex has been completed. Preliminary results can be found in CV Proc through chart review.  Results were given to [place].  03/15/21 3:01 PM Carlos Levering RVT

## 2021-03-15 NOTE — ED Provider Notes (Signed)
Emergency Medicine Provider Triage Evaluation Note  Alex Sandoval , a 35 y.o. male  was evaluated in triage.  Pt complains of left shoulder pain.  Patient states he began having central chest pain 45 minutes prior to arrival that radiated down left lower extremity.  Patient states he felt acutely dizzy and fell to the ground causing left shoulder dislocation.  Patient states he was evaluated at [facility] yesterday after falling 25 feet.  Information unavailable in EMR. Unsure if chart was merged after patient was identified yesterday. Patient notes the provider was concerned about a possible RLE DVT; however he notes no [place] was performed. Patient is currently chest pain free. He has a history of a.fib and has been compliant with his medications. He endorses RLE edema.    Review of Systems  Positive: Chest pain, arthralgia Negative:   Physical Exam  BP (!) 144/77 (BP Location: Right Arm)   Pulse 93   Temp 98.2 F (36.8 C) (Oral)   Resp 18   SpO2 98%  Gen:   Awake, no distress   Resp:  Normal effort  MSK:   Moves extremities without difficulty  Other:  Left shoulder dislocated  Medical Decision Making  Medically screening exam initiated at 1:41 PM.  Appropriate orders placed.  Melene Muller was informed that the remainder of the evaluation will be completed by another provider, this initial triage assessment does not replace that evaluation, and the importance of remaining in the ED until their evaluation is complete.  X-ray Cardiac labs RLE [place] to rule out DVT   Karie Kirks 03/15/21 1345    Varney Biles, MD 03/16/21 1103

## 2021-03-15 NOTE — ED Notes (Signed)
Provider declines repeat trop at this time.

## 2021-03-15 NOTE — ED Triage Notes (Addendum)
Per EMS, pt from store had episode of chest pain. He fell, chest pain went away, now complains of left shoulder pain. Hx of afib, has not been taking medications, EKG sinus tach w/ EMS. Also reports right leg swelling  BP 140/90 HR 100 O2 99%

## 2021-03-15 NOTE — ED Notes (Signed)
ED provider at the bedside.  

## 2021-03-15 NOTE — ED Notes (Signed)
Vascular at the bedside. 

## 2021-03-15 NOTE — ED Provider Notes (Signed)
   3:19 PM Patient signed out to me by previous ED physician.  Patient is a 35 yo male presenting for self induced shoulder dislocation. Xray stable with no current dislocation or fractures. Patient able to move arm without difficulty. Remains neurovascularly intact.   Pt also had hx of chest pain, sob, and syncope, with right LE swelling. NSR on monitor. EKG demonstrates no STEMI or NSTEMI, no concerning intervals, no delta wave or evidence of WPW. Troponin and CXR stable. Lower extremity US demonstrates no DVT. Patient offered CT PE study and declined.  Patient in no distress and overall condition improved here in the ED. Detailed discussions were had with the patient regarding current findings, and need for close f/u with PCP or on call doctor. The patient has been instructed to return immediately if the symptoms worsen in any way for re-evaluation. Patient verbalized understanding and is in agreement with current care plan. All questions answered prior to discharge.       Lianne Cure, DO [id number]

## 2021-03-19 ENCOUNTER — Inpatient Hospital Stay (HOSPITAL_COMMUNITY)
Admission: AD | Admit: 2021-03-19 | Discharge: 2021-03-23 | DRG: 885 | Disposition: A | Discharge status: Home / Self Care | Payer: Federal, State, Local not specified - Other | Source: Intra-hospital | Attending: Emergency Medicine | Admitting: Emergency Medicine

## 2021-03-19 ENCOUNTER — Emergency Department (HOSPITAL_COMMUNITY)
Admission: EM | Admit: 2021-03-19 | Discharge: 2021-03-19 | Disposition: A | Discharge status: Other Acute Inpatient Hospital | Payer: Medicare Other | Attending: Emergency Medicine | Admitting: Emergency Medicine

## 2021-03-19 ENCOUNTER — Other Ambulatory Visit: Payer: Self-pay | Admitting: Psychiatry

## 2021-03-19 ENCOUNTER — Other Ambulatory Visit: Payer: Self-pay

## 2021-03-19 ENCOUNTER — Encounter (HOSPITAL_COMMUNITY): Payer: Self-pay | Admitting: Emergency Medicine

## 2021-03-19 ENCOUNTER — Encounter (HOSPITAL_COMMUNITY): Payer: Self-pay | Admitting: Psychiatry

## 2021-03-19 DIAGNOSIS — F332 Major depressive disorder, recurrent severe without psychotic features: Secondary | ICD-10-CM | POA: Diagnosis not present

## 2021-03-19 DIAGNOSIS — Z20822 Contact with and (suspected) exposure to covid-19: Secondary | ICD-10-CM | POA: Diagnosis present

## 2021-03-19 DIAGNOSIS — Z765 Malingerer [conscious simulation]: Secondary | ICD-10-CM

## 2021-03-19 DIAGNOSIS — Z79899 Other long term (current) drug therapy: Secondary | ICD-10-CM | POA: Insufficient documentation

## 2021-03-19 DIAGNOSIS — F4310 Post-traumatic stress disorder, unspecified: Secondary | ICD-10-CM | POA: Diagnosis present

## 2021-03-19 DIAGNOSIS — Z7982 Long term (current) use of aspirin: Secondary | ICD-10-CM | POA: Insufficient documentation

## 2021-03-19 DIAGNOSIS — I480 Paroxysmal atrial fibrillation: Secondary | ICD-10-CM | POA: Diagnosis present

## 2021-03-19 DIAGNOSIS — F17210 Nicotine dependence, cigarettes, uncomplicated: Secondary | ICD-10-CM | POA: Insufficient documentation

## 2021-03-19 DIAGNOSIS — Y900 Blood alcohol level of less than 20 mg/100 ml: Secondary | ICD-10-CM | POA: Insufficient documentation

## 2021-03-19 DIAGNOSIS — R45851 Suicidal ideations: Secondary | ICD-10-CM | POA: Diagnosis present

## 2021-03-19 DIAGNOSIS — I2510 Atherosclerotic heart disease of native coronary artery without angina pectoris: Secondary | ICD-10-CM | POA: Diagnosis present

## 2021-03-19 DIAGNOSIS — I10 Essential (primary) hypertension: Secondary | ICD-10-CM | POA: Insufficient documentation

## 2021-03-19 DIAGNOSIS — R7989 Other specified abnormal findings of blood chemistry: Secondary | ICD-10-CM | POA: Diagnosis not present

## 2021-03-19 DIAGNOSIS — Z888 Allergy status to other drugs, medicaments and biological substances status: Secondary | ICD-10-CM

## 2021-03-19 DIAGNOSIS — Z7901 Long term (current) use of anticoagulants: Secondary | ICD-10-CM | POA: Diagnosis not present

## 2021-03-19 DIAGNOSIS — R946 Abnormal results of thyroid function studies: Secondary | ICD-10-CM | POA: Diagnosis present

## 2021-03-19 DIAGNOSIS — Z880 Allergy status to penicillin: Secondary | ICD-10-CM

## 2021-03-19 DIAGNOSIS — Z9049 Acquired absence of other specified parts of digestive tract: Secondary | ICD-10-CM | POA: Diagnosis not present

## 2021-03-19 DIAGNOSIS — D72819 Decreased white blood cell count, unspecified: Secondary | ICD-10-CM | POA: Diagnosis present

## 2021-03-19 LAB — RESP PANEL BY RT-PCR (FLU A&B, COVID) ARPGX2
Influenza A by PCR: NEGATIVE
Influenza B by PCR: NEGATIVE
SARS Coronavirus 2 by RT PCR: NEGATIVE

## 2021-03-19 LAB — COMPREHENSIVE METABOLIC PANEL
ALT: 37 U/L (ref 0–44)
AST: 32 U/L (ref 15–41)
Albumin: 3.8 g/dL (ref 3.5–5.0)
Alkaline Phosphatase: 79 U/L (ref 38–126)
Anion gap: 8 (ref 5–15)
BUN: 21 mg/dL — ABNORMAL HIGH (ref 6–20)
CO2: 24 mmol/L (ref 22–32)
Calcium: 8.9 mg/dL (ref 8.9–10.3)
Chloride: 110 mmol/L (ref 98–111)
Creatinine, Ser: 0.96 mg/dL (ref 0.61–1.24)
GFR, Estimated: >60 mL/min (ref >60)
Glucose, Bld: 118 mg/dL — ABNORMAL HIGH (ref 70–99)
Potassium: 3.5 mmol/L (ref 3.5–5.1)
Sodium: 142 mmol/L (ref 135–145)
Total Bilirubin: 0.4 mg/dL (ref 0.3–1.2)
Total Protein: 6.7 g/dL (ref 6.5–8.1)

## 2021-03-19 LAB — ETHANOL: Alcohol, Ethyl (B): <10 mg/dL (ref <10)

## 2021-03-19 LAB — CBC WITH DIFFERENTIAL/PLATELET
Abs Immature Granulocytes: 0.01 10*3/uL (ref 0.00–0.07)
Basophils Absolute: 0 10*3/uL (ref 0.0–0.1)
Basophils Relative: 1 %
Eosinophils Absolute: 0.2 10*3/uL (ref 0.0–0.5)
Eosinophils Relative: 4 %
HCT: 33.1 % — ABNORMAL LOW (ref 39.0–52.0)
Hemoglobin: 11 g/dL — ABNORMAL LOW (ref 13.0–17.0)
Immature Granulocytes: 0 %
Lymphocytes Relative: 22 %
Lymphs Abs: 1 10*3/uL (ref 0.7–4.0)
MCH: 32.6 pg (ref 26.0–34.0)
MCHC: 33.2 g/dL (ref 30.0–36.0)
MCV: 98.2 fL (ref 80.0–100.0)
Monocytes Absolute: 0.4 10*3/uL (ref 0.1–1.0)
Monocytes Relative: 9 %
Neutro Abs: 2.9 10*3/uL (ref 1.7–7.7)
Neutrophils Relative %: 64 %
Platelets: 218 10*3/uL (ref 150–400)
RBC: 3.37 MIL/uL — ABNORMAL LOW (ref 4.22–5.81)
RDW: 14.1 % (ref 11.5–15.5)
WBC: 4.6 10*3/uL (ref 4.0–10.5)
nRBC: 0 % (ref 0.0–0.2)

## 2021-03-19 LAB — RAPID URINE DRUG SCREEN, HOSP PERFORMED
Amphetamines: NOT DETECTED
Barbiturates: NOT DETECTED
Benzodiazepines: NOT DETECTED
Cocaine: NOT DETECTED
Opiates: NOT DETECTED
Tetrahydrocannabinol: NOT DETECTED

## 2021-03-19 MED ORDER — APIXABAN 5 MG PO TABS
5.0000 mg | ORAL_TABLET | Freq: Two times a day (BID) | ORAL | Status: DC
  Administered 2021-03-19 – 2021-03-23 (×7): 5 mg via ORAL
  Filled 2021-03-19 (×13): qty 1

## 2021-03-19 MED ORDER — ASPIRIN EC 81 MG PO TBEC
81.0000 mg | DELAYED_RELEASE_TABLET | Freq: Every day | ORAL | Status: DC
  Administered 2021-03-20 – 2021-03-23 (×3): 81 mg via ORAL
  Filled 2021-03-19 (×6): qty 1

## 2021-03-19 MED ORDER — PRAZOSIN HCL 2 MG PO CAPS
2.0000 mg | ORAL_CAPSULE | Freq: Every day | ORAL | Status: DC
  Filled 2021-03-19 (×2): qty 1
  Filled 2021-03-19: qty 2
  Filled 2021-03-19: qty 1

## 2021-03-19 MED ORDER — METOPROLOL TARTRATE 25 MG PO TABS
75.0000 mg | ORAL_TABLET | Freq: Two times a day (BID) | ORAL | Status: DC
  Administered 2021-03-20: 75 mg via ORAL
  Filled 2021-03-19 (×9): qty 3

## 2021-03-19 MED ORDER — ESCITALOPRAM OXALATE 10 MG PO TABS
10.0000 mg | ORAL_TABLET | Freq: Every day | ORAL | Status: DC
  Administered 2021-03-19: 10 mg via ORAL
  Filled 2021-03-19 (×4): qty 1
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
## 2021-03-19 NOTE — ED Provider Notes (Signed)
Emergency Medicine Observation Re-evaluation Note  Alex Sandoval is a 35 y.o. male, seen on rounds today.  Pt initially presented to the ED for complaints of Mental Health Problem Currently, the patient is sleeping.  Physical Exam  BP 138/86 (BP Location: Left Arm)   Pulse 90   Temp 98.5 F (36.9 C) (Oral)   Resp 17   SpO2 98%  Physical Exam General: Resting Comfortably Lungs: Resp even and unlabored Psych: Sleeping, calm  ED Course / MDM  EKG:   I have reviewed the labs performed to date as well as medications administered while in observation.  Recent changes in the last 24 hours include none.  Plan  Current plan is for psych reassessment.  Alex Sandoval is not under involuntary commitment.     Truddie Hidden, MD 03/19/21 [phone]

## 2021-03-19 NOTE — ED Notes (Signed)
All belongings were placed into a pt belongings bag, placed in triage hold. Labeled.

## 2021-03-19 NOTE — ED Provider Notes (Signed)
[place] DEPT Provider Note   CSN: [id number] Arrival date & time: 03/19/21  0139     History Chief Complaint  Patient presents with   Mental Health Problem    Alex Sandoval is a 35 y.o. male.  Patient presents to the emergency department with a chief complaint of PTSD.  He states that he has been having severe flashbacks after having a fall a few days ago.  He states that his symptoms have been worsening.  He reports that he called the crisis hotline, and they told him to come to the emergency department for psychiatric evaluation.  He denies any drug or alcohol use.  Denies any SI or HI.  Denies any other associated symptoms.  The history is provided by the patient. No language interpreter was used.      Past Medical History:  Diagnosis Date   Confabulation    Coronary artery disease    Drug abuse ([place])    History of pseudoseizure    Hypertension    Malingering    Personality disorder ([place])    Post traumatic stress disorder    Seizures ([facility])    Shoulder dislocation, recurrent    SELF-INDUCED IN ATTEMPT TO OBTAIN OPIATES    Patient Active Problem List   Diagnosis Date Noted   Opiate abuse, continuous ([place]) 01/23/2012    Class: Acute   Benzodiazepine abuse, continuous ([place]) 01/23/2012    Class: Acute    Past Surgical History:  Procedure Laterality Date   APPENDECTOMY         No family history on file.  Social History   Tobacco Use   Smoking status: Every Day    Packs/day: 0.50    Years: 10.00    Pack years: 5.00    Types: Cigarettes    Last attempt to quit: 01/30/2016    Years since quitting: 5.1   Smokeless tobacco: Never  Substance Use Topics   Alcohol use: No   Drug use: Yes    Frequency: 3.0 times per week    Comment: hx of opiate and benzo addiction/abuse    Home Medications Prior to Admission medications   Medication Sig Start Date End Date Taking? Authorizing Provider  apixaban (ELIQUIS) 5 MG TABS  tablet Take 5 mg by mouth in the morning and at bedtime.    [provider]  aspirin EC 81 MG tablet Take 81 mg by mouth daily.    [provider]  Metoprolol Tartrate 75 MG TABS Take 75 mg by mouth in the morning and at bedtime.    [provider]    Allergies    Benadryl [diphenhydramine hcl], Penicillins, and Ketamine  Review of Systems   Review of Systems  All other systems reviewed and are negative.  Physical Exam Updated Vital Signs BP 138/86 (BP Location: Left Arm)   Pulse 90   Temp 98.5 F (36.9 C) (Oral)   Resp 17   SpO2 98%   Physical Exam Vitals and nursing note reviewed.  Constitutional:      General: He is not in acute distress.    Appearance: He is well-developed. He is not ill-appearing.  HENT:     Head: Normocephalic and atraumatic.  Eyes:     Conjunctiva/sclera: Conjunctivae normal.  Cardiovascular:     Rate and Rhythm: Normal rate.  Pulmonary:     Effort: Pulmonary effort is normal. No respiratory distress.  Abdominal:     General: There is no distension.  Musculoskeletal:  Cervical back: Neck supple.     Comments: Moves all extremities  Skin:    General: Skin is warm and dry.  Neurological:     Mental Status: He is alert and oriented to person, place, and time.  Psychiatric:        Mood and Affect: Mood normal.        Behavior: Behavior normal.    ED Results / Procedures / Treatments   Labs (all labs ordered are listed, but only abnormal results are displayed) Labs Reviewed  CBC WITH DIFFERENTIAL/PLATELET - Abnormal; Notable for the following components:      Result Value   RBC 3.37 (*)    Hemoglobin 11.0 (*)    HCT 33.1 (*)    All other components within normal limits  RESP PANEL BY RT-PCR (FLU A&B, COVID) ARPGX2  RAPID URINE DRUG SCREEN, HOSP PERFORMED  COMPREHENSIVE METABOLIC PANEL  ETHANOL    EKG None  Radiology No results found.  Procedures Procedures   Medications Ordered in ED Medications  - No data to display  ED Course  I have reviewed the triage vital signs and the nursing notes.  Pertinent labs & imaging results that were available during my care of the patient were reviewed by me and considered in my medical decision making (see chart for details).    MDM Rules/Calculators/A&P                           Patient here with PTSD symptoms.  Reports she is having flashbacks.  Crisis intervention hotline advised him to come to the ED for psychiatric evaluation.  Vital signs are stable.  Patient appears medically clear for TTS evaluation.  Dispo pending TTS evaluation. Final Clinical Impression(s) / ED Diagnoses Final diagnoses:  PTSD (post-traumatic stress disorder)    Rx / DC Orders ED Discharge Orders     None        Montine Circle, PA-C 03/19/21 0424    Ezequiel Essex, MD 03/19/21 [phone]

## 2021-03-19 NOTE — BH Assessment (Signed)
[place] Assessment Progress Note   Per Margorie John, NP, this pt requires psychiatric hospitalization at this time.  [place], RN, [facility] has assigned pt to [facility] Rm 402-2 to the service of Dr Nelda Marseille.  Pt has signed Voluntary Admission and Consent for Treatment and signed form has been faxed to [facility].  EDP Calvert Cantor, NP and charge nurse Rodman Key have been notified.  Please  send original paperwork along with pt via [employer], and call report to [phone].   Jalene Mullet, [place] [facility] Coordinator [phone]
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
## 2021-03-19 NOTE — Tx Team (Signed)
Initial Treatment Plan 03/19/2021 3:38 PM Alex Sandoval [id number]    PATIENT STRESSORS: Financial difficulties Health problems Traumatic event   PATIENT STRENGTHS: Ability for insight Capable of independent living Communication skills Motivation for treatment/growth   PATIENT IDENTIFIED PROBLEMS: "To put PTSD under control"  "Nightmares to be fixed"  Anxiety  Depression               DISCHARGE CRITERIA:  Ability to meet basic life and health needs Adequate post-discharge living arrangements Motivation to continue treatment in a less acute level of care  PRELIMINARY DISCHARGE PLAN: Attend aftercare/continuing care group Outpatient therapy Return to previous living arrangement  PATIENT/FAMILY INVOLVEMENT: This treatment plan has been presented to and reviewed with the patient, Alex Sandoval, and/or family member.  The patient and family have been given the opportunity to ask questions and make suggestions.  Vela Prose, RN 03/19/2021, 3:38 PM

## 2021-03-19 NOTE — Consult Note (Signed)
Patient was seen and assessed by this nurse practitioner. At this patient meets inpatient criteria. He is very high risk for suicide completion, and has access to means (death by firearm).   -NP has initiated contact with [facility], currently under review.  -Patient also meets criteria for IVC if he does not which to go in voluntarily.

## 2021-03-19 NOTE — ED Notes (Signed)
Safe transport called, they report there will be a delay in transport.

## 2021-03-19 NOTE — ED Notes (Signed)
Urine specimen/UA obtained, placed in triage urine specimen bin, holding for order.

## 2021-03-19 NOTE — ED Notes (Addendum)
Pt is very kind and cooperative with staff. Currently has no complaints and showing no aggressive behavior.

## 2021-03-19 NOTE — BHH Group Notes (Signed)
Type of Therapy and Topic: Group Therapy: Anger Management   Participation Level:  Did not attend   Description of Group: In this group, patients will learn helpful strategies and techniques to manage anger, express anger in alternative ways, change hostile attitudes, and prevent aggressive acts, such as verbal abuse and violence.This group will be process-oriented and eductional, with patients participating in exploration of their own experiences as well as giving and receiving support and challenge from other group members.  Therapeutic Goals: Patient will learn to manage anger. Patient will learn to stop violence or the threat of violence. Patient will learn to develop self control over thoughts and actions. Patient will receive support and feedback from others  Summary of Patient Progress: Did not attend

## 2021-03-19 NOTE — ED Triage Notes (Addendum)
Patient states that he suffers from PTSD after being in the [employer]. Patient states that after he took a fall the other day, he has had an increase in flashbacks and symptoms. Patient requesting help and evaluation. Denies SI/HI/AVH.
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
## 2021-03-20 DIAGNOSIS — F332 Major depressive disorder, recurrent severe without psychotic features: Secondary | ICD-10-CM | POA: Diagnosis not present

## 2021-03-20 DIAGNOSIS — F4310 Post-traumatic stress disorder, unspecified: Secondary | ICD-10-CM | POA: Diagnosis present

## 2021-03-20 LAB — CBC WITH DIFFERENTIAL/PLATELET
Abs Immature Granulocytes: 0 10*3/uL (ref 0.00–0.07)
Basophils Absolute: 0 10*3/uL (ref 0.0–0.1)
Basophils Relative: 1 %
Eosinophils Absolute: 0.2 10*3/uL (ref 0.0–0.5)
Eosinophils Relative: 6 %
HCT: 38.9 % — ABNORMAL LOW (ref 39.0–52.0)
Hemoglobin: 12.9 g/dL — ABNORMAL LOW (ref 13.0–17.0)
Immature Granulocytes: 0 %
Lymphocytes Relative: 20 %
Lymphs Abs: 0.8 10*3/uL (ref 0.7–4.0)
MCH: 32.3 pg (ref 26.0–34.0)
MCHC: 33.2 g/dL (ref 30.0–36.0)
MCV: 97.3 fL (ref 80.0–100.0)
Monocytes Absolute: 0.4 10*3/uL (ref 0.1–1.0)
Monocytes Relative: 9 %
Neutro Abs: 2.5 10*3/uL (ref 1.7–7.7)
Neutrophils Relative %: 64 %
Platelets: 279 10*3/uL (ref 150–400)
RBC: 4 MIL/uL — ABNORMAL LOW (ref 4.22–5.81)
RDW: 13.9 % (ref 11.5–15.5)
WBC: 3.9 10*3/uL — ABNORMAL LOW (ref 4.0–10.5)
nRBC: 0 % (ref 0.0–0.2)

## 2021-03-20 LAB — TSH: TSH: 10.401 u[IU]/mL — ABNORMAL HIGH (ref 0.350–4.500)

## 2021-03-20 LAB — LIPID PANEL
Cholesterol: 152 mg/dL (ref 0–200)
HDL: 44 mg/dL (ref >40)
LDL Cholesterol: 89 mg/dL (ref 0–99)
Total CHOL/HDL Ratio: 3.5 RATIO
Triglycerides: 97 mg/dL (ref <150)
VLDL: 19 mg/dL (ref 0–40)

## 2021-03-20 LAB — HEMOGLOBIN A1C
Hgb A1c MFr Bld: 5.6 % (ref 4.8–5.6)
Mean Plasma Glucose: 114.02 mg/dL

## 2021-03-20 MED ORDER — ALUM & MAG HYDROXIDE-SIMETH 200-200-20 MG/5ML PO SUSP: 15.0000 mL | Freq: Four times a day (QID) | ORAL | Status: DC | PRN

## 2021-03-20 MED ORDER — TRAZODONE HCL 50 MG PO TABS: 50.0000 mg | ORAL_TABLET | Freq: Every evening | ORAL | Status: DC | PRN

## 2021-03-20 MED ORDER — ACETAMINOPHEN 325 MG PO TABS: 650.0000 mg | ORAL_TABLET | Freq: Four times a day (QID) | ORAL | Status: DC | PRN

## 2021-03-20 MED ORDER — HYDROXYZINE HCL 25 MG PO TABS
25.0000 mg | ORAL_TABLET | Freq: Three times a day (TID) | ORAL | Status: DC | PRN
  Filled 2021-03-20: qty 1

## 2021-03-20 MED ORDER — MIRTAZAPINE 15 MG PO TABS
15.0000 mg | ORAL_TABLET | Freq: Every day | ORAL | Status: DC
  Administered 2021-03-21 – 2021-03-22 (×2): 15 mg via ORAL
  Filled 2021-03-20 (×6): qty 1

## 2021-03-20 MED ORDER — PRAZOSIN HCL 1 MG PO CAPS
1.0000 mg | ORAL_CAPSULE | Freq: Every day | ORAL | Status: DC
  Administered 2021-03-21: 1 mg via ORAL
  Filled 2021-03-20 (×5): qty 1

## 2021-03-20 MED ORDER — MAGNESIUM HYDROXIDE 400 MG/5ML PO SUSP: 5.0000 mL | Freq: Every day | ORAL | Status: DC | PRN

## 2021-03-20 NOTE — B[facility] Group Notes (Signed)
[facility] Group Notes:  (Nursing/MHT/Case Management/Adjunct)  Date:  03/20/2021  Time:  2:17 AM  Type of Therapy:  Group Therapy  Participation Level:  Minimal  Participation Quality:  Inattentive  Affect:  Anxious  Cognitive:  Appropriate  Insight:  Lacking  Engagement in Group:  Lacking  Modes of Intervention:  Discussion  Summary of Progress/Problems:  Alex Sandoval 03/20/2021, 2:17 AM

## 2021-03-20 NOTE — Progress Notes (Signed)
Adult Psychoeducational Group Note  Date:  03/20/2021 Time:  9:33 AM  Group Topic/Focus:  Goals Group:   The focus of this group is to help patients establish daily goals to achieve during treatment and discuss how the patient can incorporate goal setting into their daily lives to aide in recovery.  Participation Level:  Active  Participation Quality:  Appropriate  Affect:  Appropriate  Cognitive:  Alert  Insight: Appropriate  Engagement in Group:  Engaged  Modes of Intervention:  Discussion  Additional Comments:  Pt attended group and participated in discussion.  Jeriah Skufca R Solei Wubben 03/20/2021, 9:33 AM

## 2021-03-20 NOTE — BHH Group Notes (Signed)
Graduation from nursing school

## 2021-03-20 NOTE — Plan of Care (Signed)
  Problem: Education: Goal: Emotional status will improve Outcome: Not Progressing Goal: Mental status will improve Outcome: Not Progressing   Problem: Activity: Goal: Interest or engagement in leisure activities will improve Outcome: Not Progressing

## 2021-03-20 NOTE — Progress Notes (Signed)
Pt is A&Ox4, denies SI/HI/AVH, calm, cooperative, pleasant, visible in the dayroom. Will continue to monitor pt per Q15 minute face checks and monitor for safety and progress.

## 2021-03-20 NOTE — Progress Notes (Signed)
EKG completed

## 2021-03-20 NOTE — Progress Notes (Signed)
Pt presents with no complaints.  Pt appears depressed and sad.  Pt denies SI/HI, and verbally contracts for safety.  Pt denies AVH at this time, but admits to hearing voices previously.  Pt refuses scheduled BP medications.  Labs/vitals monitored.  Pt remains safe on the unit with Q 15 minute safety checks.    03/19/21 2236  Psych Admission Type (Psych Patients Only)  Admission Status Voluntary  Psychosocial Assessment  Patient Complaints None  Eye Contact Brief  Facial Expression Flat;Pensive  Affect Depressed  Speech Logical/coherent  Interaction Assertive  Motor Activity Other (Comment) (wdl)  Appearance/Hygiene Unremarkable  Behavior Characteristics Cooperative;Anxious  Mood Depressed;Anxious;Irritable  Thought Process  Coherency WDL  Content WDL  Delusions None reported or observed  Perception Hallucinations  Hallucination None reported or observed  Judgment Poor  Confusion None  Danger to Self  Current suicidal ideation? Denies  Danger to Others  Danger to Others None reported or observed

## 2021-03-20 NOTE — Progress Notes (Signed)
     03/20/21 2100  Psych Admission Type (Psych Patients Only)  Admission Status Voluntary  Psychosocial Assessment  Patient Complaints Depression;Anxiety  Eye Contact Brief  Facial Expression Flat;Pensive  Affect Depressed  Speech Logical/coherent  Interaction Assertive  Motor Activity Other (Comment) (wdl)  Appearance/Hygiene Unremarkable  Behavior Characteristics Restless;Anxious  Mood Depressed;Anxious;Irritable  Thought Process  Coherency WDL  Content WDL  Delusions None reported or observed  Perception WDL  Hallucination None reported or observed  Judgment Poor  Confusion None  Danger to Self  Current suicidal ideation? Denies  Danger to Others  Danger to Others None reported or observed

## 2021-03-20 NOTE — Plan of Care (Signed)
  Problem: Education: Goal: Emotional status will improve Outcome: Not Progressing Goal: Mental status will improve Outcome: Not Progressing   Problem: Coping: Goal: Coping ability will improve Outcome: Not Progressing

## 2021-03-20 NOTE — Progress Notes (Signed)
Psychoeducational Group Note  Date:  03/20/2021 Time:  2000  Group Topic/Focus:  Wrap up group  Participation Level: Did Not Attend  Participation Quality:  Not Applicable  Affect:  Not Applicable  Cognitive:  Not Applicable  Insight:  Not Applicable  Engagement in Group: Not Applicable  Additional Comments:  Did not attend.   Shellia Cleverly 03/20/2021, 9:21 PM

## 2021-03-20 NOTE — H&P (Addendum)
Psychiatric Admission Assessment Adult  Patient Identification: Alex Sandoval MRN:  [id number] Date of Evaluation:  03/20/2021 Chief Complaint:  MDD (major depressive disorder), recurrent episode, severe ([place]) [F33.2] Principal Diagnosis: MDD (major depressive disorder), recurrent severe, without psychosis ([place]) Diagnosis:  Principal Problem:   MDD (major depressive disorder), recurrent severe, without psychosis ([place]) Active Problems:   PTSD (post-traumatic stress disorder)  History of Present Illness:  Hashim Sermersheim is a 35 year old male with a past psychiatric history of PTSD polysubstance use in remission and depression and anxiety, presenting voluntarily with increasing depression in the context of the 1 year anniversary of his daughters murder.  Per chart review, patient presented to [facility] emergency department on 8/19 at 5 AM complaining of worsening depression, anxiety, and PTSD symptoms on the 1 year anniversary of his daughter's death.  Patient reports that he called the crisis line and was told to come to his nearest ED.  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  He reports being sober for the past 1 year.  Also per chart review, patient had a previous 3-day hospitalization at the behavioral health hospital primarily for substance abuse and depression.  He was prescribed Elavil during that period of time.  The charted past medical history also includes a history of a pseudoseizure malingering and intentional shoulder dislocation.  On interview with this provider patient is guarded and appears irritated. He confirms the above history and reports significant PTSD symptoms of flashbacks, nightmares, VH at nighttime, and hypervigilance. He reported never having worked with a trauma therapist, but says that "anything is worth a try at this point". He denies suicidal ideation and says that his last SI occurred 2 days ago.  He is almost pan positive for  depressive symptoms, with the only intact function being his ability to concentrate when reading novels. He reports a previous medication trial with Remeron that helped him sleep much better and he is interested in trying this medication again. He reports never having taken Lexapro previously.  The patient refuses collateral contact for this provider.    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 The direct patient care time included face-to-face time with the patient, reviewing the patient's chart, communicating with other professionals, and coordinating care. Greater than 50% of this time was spent in counseling or coordinating care with the patient regarding goals of hospitalization, psycho-education, and discharge planning needs.   Past Psychiatric History: PTSD, depression, anxiety, polysubstance use in remission  Is the patient at risk to self? Yes.    Has the patient been a risk to self in the past 6 months? Yes.    Has the patient been a risk to self within the distant past? Yes.    Is the patient a risk to others? No.  Has the patient been a risk to others in the past 6 months? No.  Has the patient been a risk to others within the distant past? No.   Prior Inpatient Therapy:  yes Prior Outpatient Therapy:  no  Alcohol Screening: Patient refused Alcohol Screening Tool: Yes 1. How often do  you have a drink containing alcohol?: Never 2. How many drinks containing alcohol do you have on a typical day when you are drinking?: 1 or 2 3. How often do you  have six or more drinks on one occasion?: Never AUDIT-C Score: 0 Substance Abuse History in the last 12 months:  No. Consequences of Substance Abuse: NA Previous Psychotropic Medications: Yes  Psychological Evaluations: Yes  Past Medical History:  Past Medical History:  Diagnosis Date   Confabulation    Coronary artery disease    Drug abuse ([place])    History of pseudoseizure    Hypertension    Malingering    Personality disorder ([place])    Post traumatic stress disorder    Seizures ([facility])    Shoulder dislocation, recurrent    SELF-INDUCED IN ATTEMPT TO OBTAIN OPIATES    Past Surgical History:  Procedure Laterality Date   APPENDECTOMY     Family History: History reviewed. No pertinent family history. Family Psychiatric  History: denies Hx of schizophrenia, BPAD, suicide, or substance abuse Tobacco Screening:  10 py Hx Social History:  Patient lives with family in a strained relationship Hx of BNZ, alcohol, and narcotic abuse     Allergies:   Allergies  Allergen Reactions   Benadryl [Diphenhydramine Hcl] Anaphylaxis   Penicillins Anaphylaxis    Has patient had a PCN reaction causing immediate rash, facial/tongue/throat swelling, SOB or lightheadedness with hypotension: Yes Has patient had a PCN reaction causing severe rash involving mucus membranes or skin necrosis: No Has patient had a PCN reaction that required hospitalization No Has patient had a PCN reaction occurring within the last 10 years: Yes If all of the above answers are "NO", then may proceed with Cephalosporin use.   Ketamine Hypertension   Lab Results:  Results for orders placed or performed during the hospital encounter of 03/19/21 (from the past 48 hour(s))  TSH     Status: Abnormal   Collection Time: 03/20/21  6:55 AM  Result Value Ref Range   TSH 10.401 (H) 0.350 - 4.500 uIU/mL    Comment: Performed by a 3rd Generation assay with a functional sensitivity of <=0.01 uIU/mL. Performed at [facility], [place] [address], [place], [place] [address]   Hemoglobin A1c     Status: None   Collection Time: 03/20/21  6:55 AM  Result Value Ref Range   Hgb A1c MFr Bld 5.6 4.8 - 5.6 %    Comment: (NOTE) Pre diabetes:          5.7%-6.4%  Diabetes:              >6.4%  Glycemic control for   <7.0% adults with diabetes    Mean Plasma Glucose 114.02 mg/dL    Comment: Performed at [facility] [address] [place], [place] [address]  Lipid panel     Status: None   Collection Time: 03/20/21  6:55 AM  Result Value Ref Range   Cholesterol 152 0 - 200 mg/dL   Triglycerides 97 <150 mg/dL   HDL 44 >40 mg/dL   Total CHOL/HDL Ratio 3.5 RATIO   VLDL 19 0 - 40 mg/dL   LDL Cholesterol 89 0 - 99 mg/dL    Comment:        Total Cholesterol/HDL:CHD Risk Coronary Heart Disease Risk Table                     Men   Women  1/2 Average Risk  3.4   3.3  Average Risk       5.0   4.4  2 X Average Risk   9.6   7.1  3 X Average Risk  23.4   11.0        Use the calculated Patient Ratio above and the CHD Risk Table to determine the patient's CHD Risk.        ATP III CLASSIFICATION (LDL):  <100     mg/dL   Optimal  100-129  mg/dL   Near or Above                    Optimal  130-159  mg/dL   Borderline  160-189  mg/dL   High  >190     mg/dL   Very High Performed at [facility] [address], [place], [place] [address]   CBC with Differential/Platelet     Status: Abnormal   Collection Time: 03/20/21  6:55 AM  Result Value Ref Range   WBC 3.9 (L) 4.0 - 10.5 K/uL   RBC 4.00 (L) 4.22 - 5.81 MIL/uL   Hemoglobin 12.9 (L) 13.0 - 17.0 g/dL   HCT 38.9 (L) 39.0 - 52.0 %   MCV 97.3 80.0 - 100.0 fL   MCH 32.3 26.0 - 34.0 pg   MCHC 33.2 30.0 - 36.0 g/dL   RDW 13.9 11.5 - 15.5 %   Platelets 279 150 - 400 K/uL   nRBC 0.0 0.0 - 0.2 %   Neutrophils Relative % 64 %   Neutro Abs 2.5 1.7 - 7.7 K/uL   Lymphocytes Relative 20 %   Lymphs Abs 0.8 0.7 - 4.0 K/uL    Monocytes Relative 9 %   Monocytes Absolute 0.4 0.1 - 1.0 K/uL   Eosinophils Relative 6 %   Eosinophils Absolute 0.2 0.0 - 0.5 K/uL   Basophils Relative 1 %   Basophils Absolute 0.0 0.0 - 0.1 K/uL   Immature Granulocytes 0 %   Abs Immature Granulocytes 0.00 0.00 - 0.07 K/uL    Comment: Performed at [facility], [address], [place], [place] [address]    Blood Alcohol level:  Lab Results  Component Value Date   [facility] <10 03/19/2021   ETH <11 [id number]    Metabolic Disorder Labs:  Lab Results  Component Value Date   HGBA1C 5.6 03/20/2021   MPG 114.02 03/20/2021   No results found for: PROLACTIN Lab Results  Component Value Date   CHOL 152 03/20/2021   TRIG 97 03/20/2021   HDL 44 03/20/2021   CHOLHDL 3.5 03/20/2021   VLDL 19 03/20/2021   LDLCALC 89 03/20/2021    Current Medications: Current Facility-Administered Medications  Medication Dose Route Frequency Provider Last Rate Last Admin   acetaminophen (TYLENOL) tablet 650 mg  650 mg Oral Q6H PRN Corky Sox, MD       alum & mag hydroxide-simeth (MAALOX/MYLANTA) 200-200-20 MG/5ML suspension 15 mL  15 mL Oral Q6H PRN Corky Sox, MD       apixaban Arne Cleveland) tablet 5 mg  5 mg Oral BID Bobbitt, Shalon E, NP   5 mg at 03/20/21 1703   aspirin EC tablet 81 mg  81 mg Oral Daily Bobbitt, Shalon E, NP   81 mg at 03/20/21 [id number]   magnesium hydroxide (MILK OF MAGNESIA) suspension 5 mL  5 mL Oral Daily PRN Corky Sox, MD       metoprolol tartrate (LOPRESSOR) tablet 75 mg  75 mg  Oral BID Bobbitt, Shalon E, NP   75 mg at 03/20/21 [id number]   mirtazapine (REMERON) tablet 15 mg  15 mg Oral QHS Nelda Marseille, Naythan Douthit E, MD       prazosin (MINIPRESS) capsule 1 mg  1 mg Oral QHS Nelda Marseille, Mohini Heathcock E, MD       PTA Medications: Medications Prior to Admission  Medication Sig Dispense Refill Last Dose   apixaban (ELIQUIS) 5 MG TABS tablet Take 5 mg by mouth in the morning and at bedtime.      aspirin EC 81 MG tablet Take 81  mg by mouth daily.      Metoprolol Tartrate 75 MG TABS Take 75 mg by mouth in the morning and at bedtime.       Musculoskeletal: Strength & Muscle Tone: within normal limits Gait & Station: normal Patient leans: N/A    Psychiatric Specialty Exam:  Presentation  General Appearance:  Appropriate for Environment Eye Contact: Minimal Speech: Clear and Coherent Speech Volume: Decreased Handedness: No data recorded  Mood and Affect  Mood: Dysphoric; Depressed Affect: Congruent; Depressed  Thought Process  Thought Processes: Coherent Duration of Psychotic Symptoms: No data recorded Past Diagnosis of Schizophrenia or Psychoactive disorder: No  Descriptions of Associations:Intact Orientation:Full (Time, Place and Person) Thought Content:Logical Hallucinations:Hallucinations: None Ideas of Reference:None Suicidal Thoughts:Suicidal Thoughts: No Homicidal Thoughts:Homicidal Thoughts: No  Sensorium  Memory: Immediate Good; Recent Good; Remote Good Judgment: Fair Insight: Fair  Community education officer  Concentration: Good Attention Span: Good Recall: Good Fund of Knowledge: Good Language: Good  Psychomotor Activity  Psychomotor Activity: Psychomotor Activity: Normal  Assets  Assets: Housing; Social Support  Sleep  Sleep: Sleep: Fair   Physical Exam: Physical Exam Vitals and nursing note reviewed.  HENT:     Head: Normocephalic and atraumatic.  Pulmonary:     Effort: Pulmonary effort is normal.  Neurological:     General: No focal deficit present.     Mental Status: He is alert and oriented to person, place, and time.   Review of Systems  Respiratory:  Negative for shortness of breath.   Cardiovascular:  Negative for chest pain.  Gastrointestinal:  Negative for nausea and vomiting.  Neurological:  Negative for headaches.  Blood pressure 127/78, pulse 72, temperature 98.4 F (36.9 C), temperature source Oral, resp. rate 18, height 6' (1.829  m), weight 104.3 kg, SpO2 99 %. Body mass index is 31.19 kg/m.  Treatment Plan Summary: Daily contact with patient to assess and evaluate symptoms and progress in treatment and Medication management  Observation Level/Precautions:  15 minute checks  Laboratory:  as below  Psychotherapy:  supportive  Medications:  as below  Consultations:  NA  Discharge Concerns: NA    Estimated LOS: 3-5 days  Other:  NA   Physician Treatment Plan for Primary Diagnosis: MDD (major depressive disorder), recurrent severe, without psychosis ([place]) Long Term Goal(s): Improvement in symptoms so as ready for discharge  Short Term Goals: Ability to demonstrate self-control will improve and Ability to identify and develop effective coping behaviors will improve  Physician Treatment Plan for Secondary Diagnosis: Principal Problem:   MDD (major depressive disorder), recurrent severe, without psychosis ([place]) Active Problems:   PTSD (post-traumatic stress disorder)  Long Term Goal(s): Improvement in symptoms so as ready for discharge  Short Term Goals: Ability to identify triggers associated with substance abuse/mental health issues will improve    Safety and Monitoring -- VOLUNTARY admission to inpatient psychiatric unit for safety, stabilization and treatment -- Daily contact with  patient to assess and evaluate symptoms and progress in treatment -- Patient's case to be discussed in multi-disciplinary team meeting -- Observation Level : q15 minute checks -- Vital signs:  q12 hours -- Precautions: suicide   MDD Severe, in the context of PTSD Patient meets criteria for MDD and has significant PTSD symptoms. He reports a previous successful trial with Remeron and feel positively about his Prazosin. -Start Remeron 15 mg QHS  -Patient counseled on r/b/se of medication -Continue Prazosin, but reduce to 1 mg give concurrent use of Metoprolol  -Monitor BP and consider increase - Lexapro  discontinued -Supportive psychotherapy  Medical Management Covid negative CMP:  unremarkable CBC: WBC 3.9 with ANC 2500; H/H 12.9/38.9 and platelets 279 EtOH: <10 UDS: neg TSH: 10.4 (see below) A1C: 5.6 Lipids: WNL  Afib Self reported Hx of Afib EKG NSR with Qtc 393 (8/19) -Continue Eliquis 5 mg BID -Metoprolol 75 mg BID (watch BP) -ASA 81 mg  Elevated TSH TSH of 10.4. Patient reports previous treatment with Synthroid -T3/T4 and repeat TSH pending -Consider starting Synthroid  Leukopenia -- Recheck CBC for trending  Continue PRN's: Tylenol, Maalox, Atarax, Milk of Magnesia, and trazodone  I certify that inpatient services furnished can reasonably be expected to improve the patient's condition.    Corky Sox PGY-1, Psychiatry

## 2021-03-21 ENCOUNTER — Encounter (HOSPITAL_COMMUNITY): Payer: Self-pay | Admitting: Psychiatry

## 2021-03-21 DIAGNOSIS — R7989 Other specified abnormal findings of blood chemistry: Secondary | ICD-10-CM

## 2021-03-21 DIAGNOSIS — I480 Paroxysmal atrial fibrillation: Secondary | ICD-10-CM

## 2021-03-21 LAB — CBC WITH DIFFERENTIAL/PLATELET
Abs Immature Granulocytes: 0.01 10*3/uL (ref 0.00–0.07)
Basophils Absolute: 0 10*3/uL (ref 0.0–0.1)
Basophils Relative: 1 %
Eosinophils Absolute: 0.2 10*3/uL (ref 0.0–0.5)
Eosinophils Relative: 6 %
HCT: 38.3 % — ABNORMAL LOW (ref 39.0–52.0)
Hemoglobin: 12.6 g/dL — ABNORMAL LOW (ref 13.0–17.0)
Immature Granulocytes: 0 %
Lymphocytes Relative: 21 %
Lymphs Abs: 0.8 10*3/uL (ref 0.7–4.0)
MCH: 31.7 pg (ref 26.0–34.0)
MCHC: 32.9 g/dL (ref 30.0–36.0)
MCV: 96.2 fL (ref 80.0–100.0)
Monocytes Absolute: 0.3 10*3/uL (ref 0.1–1.0)
Monocytes Relative: 9 %
Neutro Abs: 2.3 10*3/uL (ref 1.7–7.7)
Neutrophils Relative %: 63 %
Platelets: 251 10*3/uL (ref 150–400)
RBC: 3.98 MIL/uL — ABNORMAL LOW (ref 4.22–5.81)
RDW: 13.7 % (ref 11.5–15.5)
WBC: 3.7 10*3/uL — ABNORMAL LOW (ref 4.0–10.5)
nRBC: 0 % (ref 0.0–0.2)

## 2021-03-21 LAB — MAGNESIUM: Magnesium: 2.4 mg/dL (ref 1.7–2.4)

## 2021-03-21 LAB — T4, FREE: Free T4: 0.73 ng/dL (ref 0.61–1.12)

## 2021-03-21 LAB — TSH: TSH: 18.043 u[IU]/mL — ABNORMAL HIGH (ref 0.350–4.500)

## 2021-03-21 MED ORDER — ENSURE ENLIVE PO LIQD
237.0000 mL | Freq: Two times a day (BID) | ORAL | Status: DC
  Administered 2021-03-21 – 2021-03-23 (×2): 237 mL via ORAL
  Filled 2021-03-21 (×8): qty 237

## 2021-03-21 MED ORDER — MAGNESIUM OXIDE -MG SUPPLEMENT 400 (240 MG) MG PO TABS
400.0000 mg | ORAL_TABLET | Freq: Every day | ORAL | Status: DC
  Filled 2021-03-21 (×2): qty 1

## 2021-03-21 MED ORDER — METOPROLOL TARTRATE 25 MG PO TABS
25.0000 mg | ORAL_TABLET | Freq: Two times a day (BID) | ORAL | Status: DC
  Filled 2021-03-21 (×3): qty 1

## 2021-03-21 MED ORDER — GABAPENTIN 100 MG PO CAPS
100.0000 mg | ORAL_CAPSULE | Freq: Three times a day (TID) | ORAL | Status: DC
  Administered 2021-03-21 – 2021-03-23 (×6): 100 mg via ORAL
  Filled 2021-03-21 (×14): qty 1

## 2021-03-21 MED ORDER — MELATONIN 5 MG PO TABS
5.0000 mg | ORAL_TABLET | Freq: Every day | ORAL | Status: DC
  Administered 2021-03-21 – 2021-03-22 (×2): 5 mg via ORAL
  Filled 2021-03-21 (×5): qty 1

## 2021-03-21 MED ORDER — POTASSIUM CHLORIDE ER 20 MEQ PO TBCR
20.0000 meq | EXTENDED_RELEASE_TABLET | Freq: Every day | ORAL | Status: DC
  Filled 2021-03-21 (×2): qty 1

## 2021-03-21 NOTE — Progress Notes (Addendum)
[facility] MD Progress Note  03/21/2021 3:02 PM Alex Sandoval  MRN:  [id number] Subjective:  Alex Sandoval is a 35 year old male with a past psychiatric history of PTSD polysubstance use in remission and depression and anxiety, presenting voluntarily with increasing depression in the context of the 1 year anniversary of his daughters murder. [place] stay day 2.   24hr events:  The patient's chart was reviewed and nursing notes were reviewed. The patient's case was discussed in multidisciplinary team meeting.  Per MAR: - Patient is compliant with scheduled meds except for Remeron, and held metoprolol and prazosin because of low blood pressure. - PRNs: none. None for agitation. Per RN notes, no documented behavioral issues, patient has been attending groups. Patient slept Number of Hours: 5.   Today (03/21/2021): The patient was seen and evaluated on the unit.  On assessment today the patient reported that his depression is getting better, rated his depression a 5/10 (0 out of 10 being no depression and anxiety as 7/10. Stated that sleep was "not good", he admitted to still having nightmares and flashback about his time in [place].  Patient stated appetite was "not good", stated that he really does not have an appetite. Requested Ensure. Discussed with patient his abnormal lab findings, with TSH of 18.043.  Patient stated that he has been on Synthroid in the past, but they have discontinued it because his TSH normalized. Patient denied SI/HI/AVH, delusions, paranoia, first rank symptoms.   Consulted medicine (Dr. Kristopher Oppenheim, DO) for advice regarding patient's TSH and medical management of A. Fib.  Principal Problem: MDD (major depressive disorder), recurrent severe, without psychosis ([place]) Diagnosis: Principal Problem:   MDD (major depressive disorder), recurrent severe, without psychosis ([place]) Active Problems:   PTSD (post-traumatic stress disorder)  Total Time spent with patient: 30  minutes  Past Psychiatric History: PTSD, depression, anxiety, polysubstance use in remission  Past Medical History:  Past Medical History:  Diagnosis Date   Confabulation    Coronary artery disease    Drug abuse ([place])    History of pseudoseizure    Hypertension    Malingering    Personality disorder ([place])    Post traumatic stress disorder    Seizures (HCC)    Shoulder dislocation, recurrent    SELF-INDUCED IN ATTEMPT TO OBTAIN OPIATES    Past Surgical History:  Procedure Laterality Date   APPENDECTOMY     Family History: History reviewed. No pertinent family history. Family Psychiatric  History: denies Hx of schizophrenia, BPAD, suicide, or substance abuse Social History:  Social History   Substance and Sexual Activity  Alcohol Use No     Social History   Substance and Sexual Activity  Drug Use Yes   Frequency: 3.0 times per week   Comment: hx of opiate and benzo addiction/abuse    Social History   Socioeconomic History   Marital status: Single    Spouse name: Not on file   Number of children: Not on file   Years of education: Not on file   Highest education level: Not on file  Occupational History   Not on file  Tobacco Use   Smoking status: Every Day    Packs/day: 0.50    Years: 10.00    Pack years: 5.00    Types: Cigarettes    Last attempt to quit: 01/30/2016    Years since quitting: 5.1   Smokeless tobacco: Never  Substance and Sexual Activity   Alcohol use: No   Drug use: Yes  Frequency: 3.0 times per week    Comment: hx of opiate and benzo addiction/abuse   Sexual activity: Yes    Birth control/protection: None  Other Topics Concern   Not on file  Social History Narrative   ** Merged History Encounter **       Social Determinants of Health   Financial Resource Strain: Not on file  Food Insecurity: Not on file  Transportation Needs: Not on file  Physical Activity: Not on file  Stress: Not on file  Social Connections: Not on file    Additional Social History:  Patient lives with family in a strained relationship Hx of BNZ, alcohol, and narcotic abuse     Sleep: Poor  Appetite:  Poor  Current Medications: Current Facility-Administered Medications  Medication Dose Route Frequency Provider Last Rate Last Admin   acetaminophen (TYLENOL) tablet 650 mg  650 mg Oral Q6H PRN Corky Sox, MD       alum & mag hydroxide-simeth (MAALOX/MYLANTA) 200-200-20 MG/5ML suspension 15 mL  15 mL Oral Q6H PRN Corky Sox, MD       apixaban Arne Cleveland) tablet 5 mg  5 mg Oral BID Bobbitt, Shalon E, NP   5 mg at 03/21/21 [id number]   aspirin EC tablet 81 mg  81 mg Oral Daily Bobbitt, Shalon E, NP   81 mg at 03/21/21 [id number]   gabapentin (NEURONTIN) capsule 100 mg  100 mg Oral TID Harlow Asa, MD   100 mg at 03/21/21 1150   hydrOXYzine (ATARAX/VISTARIL) tablet 25 mg  25 mg Oral TID PRN Corky Sox, MD       magnesium hydroxide (MILK OF MAGNESIA) suspension 5 mL  5 mL Oral Daily PRN Corky Sox, MD       magnesium oxide (MAG-OX) tablet 400 mg  400 mg Oral Daily Harlow Asa, MD       [START ON 03/22/2021] metoprolol tartrate (LOPRESSOR) tablet 25 mg  25 mg Oral BID Kristopher Oppenheim, DO       mirtazapine (REMERON) tablet 15 mg  15 mg Oral QHS Ravindra Baranek E, MD       Potassium Chloride ER TBCR 20 mEq  20 mEq Oral Daily Nelda Marseille, Maisley Hainsworth E, MD       prazosin (MINIPRESS) capsule 1 mg  1 mg Oral QHS Elijah Michaelis E, MD       traZODone (DESYREL) tablet 50 mg  50 mg Oral QHS PRN Corky Sox, MD        Lab Results:  Results for orders placed or performed during the hospital encounter of 03/19/21 (from the past 48 hour(s))  TSH     Status: Abnormal   Collection Time: 03/20/21  6:55 AM  Result Value Ref Range   TSH 10.401 (H) 0.350 - 4.500 uIU/mL    Comment: Performed by a 3rd Generation assay with a functional sensitivity of <=0.01 uIU/mL. Performed at [facility], [place] [address], [place], [place] [address]    Hemoglobin A1c     Status: None   Collection Time: 03/20/21  6:55 AM  Result Value Ref Range   Hgb A1c MFr Bld 5.6 4.8 - 5.6 %    Comment: (NOTE) Pre diabetes:          5.7%-6.4%  Diabetes:              >6.4%  Glycemic control for   <7.0% adults with diabetes    Mean Plasma Glucose 114.02 mg/dL    Comment: Performed at [facility], [place] [address], [place], [place] [address]  Lipid panel     Status: None   Collection Time: 03/20/21  6:55 AM  Result Value Ref Range   Cholesterol 152 0 - 200 mg/dL   Triglycerides 97 <150 mg/dL   HDL 44 >40 mg/dL   Total CHOL/HDL Ratio 3.5 RATIO   VLDL 19 0 - 40 mg/dL   LDL Cholesterol 89 0 - 99 mg/dL    Comment:        Total Cholesterol/HDL:CHD Risk Coronary Heart Disease Risk Table                     Men   Women  1/2 Average Risk   3.4   3.3  Average Risk       5.0   4.4  2 X Average Risk   9.6   7.1  3 X Average Risk  23.4   11.0        Use the calculated Patient Ratio above and the CHD Risk Table to determine the patient's CHD Risk.        ATP III CLASSIFICATION (LDL):  <100     mg/dL   Optimal  100-129  mg/dL   Near or Above                    Optimal  130-159  mg/dL   Borderline  160-189  mg/dL   High  >190     mg/dL   Very High Performed at [place] [address], [place], [place] [address]   CBC with Differential/Platelet     Status: Abnormal   Collection Time: 03/20/21  6:55 AM  Result Value Ref Range   WBC 3.9 (L) 4.0 - 10.5 K/uL   RBC 4.00 (L) 4.22 - 5.81 MIL/uL   Hemoglobin 12.9 (L) 13.0 - 17.0 g/dL   HCT 38.9 (L) 39.0 - 52.0 %   MCV 97.3 80.0 - 100.0 fL   MCH 32.3 26.0 - 34.0 pg   MCHC 33.2 30.0 - 36.0 g/dL   RDW 13.9 11.5 - 15.5 %   Platelets 279 150 - 400 K/uL   nRBC 0.0 0.0 - 0.2 %   Neutrophils Relative % 64 %   Neutro Abs 2.5 1.7 - 7.7 K/uL   Lymphocytes Relative 20 %   Lymphs Abs 0.8 0.7 - 4.0 K/uL   Monocytes Relative 9 %   Monocytes Absolute 0.4 0.1 - 1.0 K/uL    Eosinophils Relative 6 %   Eosinophils Absolute 0.2 0.0 - 0.5 K/uL   Basophils Relative 1 %   Basophils Absolute 0.0 0.0 - 0.1 K/uL   Immature Granulocytes 0 %   Abs Immature Granulocytes 0.00 0.00 - 0.07 K/uL    Comment: Performed at [facility], [place] [address] [place], [place] [address]  CBC with Differential/Platelet     Status: Abnormal   Collection Time: 03/21/21  6:11 AM  Result Value Ref Range   WBC 3.7 (L) 4.0 - 10.5 K/uL   RBC 3.98 (L) 4.22 - 5.81 MIL/uL   Hemoglobin 12.6 (L) 13.0 - 17.0 g/dL   HCT 38.3 (L) 39.0 - 52.0 %   MCV 96.2 80.0 - 100.0 fL   MCH 31.7 26.0 - 34.0 pg   MCHC 32.9 30.0 - 36.0 g/dL   RDW 13.7 11.5 - 15.5 %   Platelets 251 150 - 400 K/uL   nRBC 0.0 0.0 - 0.2 %  Neutrophils Relative % 63 %   Neutro Abs 2.3 1.7 - 7.7 K/uL   Lymphocytes Relative 21 %   Lymphs Abs 0.8 0.7 - 4.0 K/uL   Monocytes Relative 9 %   Monocytes Absolute 0.3 0.1 - 1.0 K/uL   Eosinophils Relative 6 %   Eosinophils Absolute 0.2 0.0 - 0.5 K/uL   Basophils Relative 1 %   Basophils Absolute 0.0 0.0 - 0.1 K/uL   Immature Granulocytes 0 %   Abs Immature Granulocytes 0.01 0.00 - 0.07 K/uL    Comment: Performed at [facility], [place] [address], [place], [place] [address]  T4, free     Status: None   Collection Time: 03/21/21  6:11 AM  Result Value Ref Range   Free T4 0.73 0.61 - 1.12 ng/dL    Comment: (NOTE) Biotin ingestion may interfere with free T4 tests. If the results are inconsistent with the TSH level, previous test results, or the clinical presentation, then consider biotin interference. If needed, order repeat testing after stopping biotin. Performed at [facility], [place] [address], [place], [place] [address]   TSH     Status: Abnormal   Collection Time: 03/21/21  6:11 AM  Result Value Ref Range   TSH 18.043 (H) 0.350 - 4.500 uIU/mL    Comment: Performed by a 3rd Generation assay with a functional sensitivity of <=0.01  uIU/mL. Performed at [facility], [place] [address], [place], [place] [address]     Blood Alcohol level:  Lab Results  Component Value Date   [facility] <10 03/19/2021   ETH <11 [id number]    Metabolic Disorder Labs: Lab Results  Component Value Date   HGBA1C 5.6 03/20/2021   MPG 114.02 03/20/2021   No results found for: PROLACTIN Lab Results  Component Value Date   CHOL 152 03/20/2021   TRIG 97 03/20/2021   HDL 44 03/20/2021   CHOLHDL 3.5 03/20/2021   VLDL 19 03/20/2021   LDLCALC 89 03/20/2021    Physical Findings:   Musculoskeletal: Strength & Muscle Tone: within normal limits Gait & Station: normal, steady Patient leans: N/A  Psychiatric Specialty Exam:  Presentation  General Appearance: Casual; Fairly Groomed, adequate hygiene  Eye Contact:Good  Speech:Clear and Coherent; Normal Rate  Speech Volume:Decreased  Handedness: Right  Mood and Affect  Mood:Anxious; Depressed  Affect:Congruent   Thought Process  Thought Processes:Coherent; Goal Directed; Linear  Descriptions of Associations:Intact  Orientation:Full (Time, Place and Person)  Thought Content:Logical, Patient denied SI/HI/AVH, delusions, paranoia, first rank symptoms. Patient is not grossly responding to internal/external stimuli on exam and did not make delusional statements.  History of Schizophrenia/Schizoaffective disorder:No  Duration of Psychotic Symptoms:No data recorded Hallucinations:Hallucinations: None  Ideas of Reference:None  Suicidal Thoughts:Suicidal Thoughts: No  Homicidal Thoughts:Homicidal Thoughts: No   Sensorium  Memory:Immediate Good; Recent Good; Remote Good  Judgment:Fair  Insight:Fair   Executive Functions  Concentration:Good  Attention Span:Good  [place] of Knowledge:Good  Language:Good   Psychomotor Activity  Psychomotor Activity:Psychomotor Activity: Normal   Assets  Assets:Desire for Improvement; Housing;  Social Support   Sleep  Sleep:Sleep: Poor Number of Hours of Sleep: 5    Physical Exam: Physical Exam Vitals and nursing note reviewed.  Constitutional:      Appearance: Normal appearance.  HENT:     Head: Normocephalic and atraumatic.  Pulmonary:     Effort: Pulmonary effort is normal.  Neurological:     General: No focal deficit present.  Mental Status: He is alert and oriented to person, place, and time.   Review of Systems  Respiratory:  Negative for shortness of breath.   Cardiovascular:  Negative for chest pain.  Gastrointestinal:  Negative for abdominal pain.  Neurological:  Negative for dizziness and headaches.  Blood pressure 125/82, pulse 79, temperature 97.9 F (36.6 C), temperature source Oral, resp. rate 18, height 6' (1.829 m), weight 104.3 kg, SpO2 95 %. Body mass index is 31.19 kg/m.   Treatment Plan Summary: Daily contact with patient to assess and evaluate symptoms and progress in treatment and Medication management  Akshith Agyeman is a 34 year old male with a past psychiatric history of PTSD polysubstance use in remission and depression and anxiety, presenting voluntarily with increasing depression in the context of the 1 year anniversary of his daughters murder. [facility] stay day 2.     ASSESSMENT: Diagnosis:  MDD Severe, in the context of PTSD Self-reported hx of afib Elevated TSH Leukopenia  TODAY (03/21/2021): Consulted medicine, will make med changes based on his recommendation.  Dr. Kristopher Oppenheim, DO note on 8/21 for additional details.   PSYCHIATRIC DIAGNOSIS & TREATMENT:  MDD recurrent Severe,without psychotic features PTSD Patient meets criteria for MDD and has significant PTSD symptoms. He reports a previous successful trial with Remeron and feel positively about his Prazosin. -Continue Remeron 15 mg QHS - compliance encouraged             -Patient counseled on r/b/se of medication -Restarted Prazosin at 1 mg, but hold if systolic BP is  less than 100 (patient refused med last night)             -Monitor BP  - Start Neurontin 100 mg p.o. 3 times daily to help with anxiety and reported neuropathic pain - Start Ensure -Lexapro discontinued -Supportive psychotherapy - Would benefit from trauma focused therapy after discharge    MEDICAL MANAGEMENT: Covid negative CMP:  unremarkable CBC: WBC 3.9 with ANC 2500; H/H 12.9/38.9 and platelets 279 (see below) EtOH: <10 UDS: neg TSH: 10.4 (see below) A1C: 5.6 Lipids: WNL   Afib Self reported Hx of Afib EKG NSR with Qtc 393 (8/19) -Continue Eliquis 5 mg BID per hospitalist recommentations -Decrease to metoprolol 25 mg twice daily due to low BP, we will continue to monitor blood pressure - (per hospitalist recommendations) -Continue ASA 81 mg per hospitalist recommendations  - Per hospitalist recommendations, Start Kcl 28mq daily to keep serum potassium >4.0 and rechecking BMP Tuesday morning (patient educated on this information and refused K+) - Per hospitalist recommendations, Start Mag oxide '400mg'$  daily to keep serum magnesium >2.0 and rechecking BMP Tuesday (patient educated on this infomration and refused mag+) - Mag level today 2.4  Elevated TSH Patient reports previous treatment with Synthroid. Per medicine consult, unsure if true hypothyroid vs. Euthyroid sick syndrome TSH of 10.4 (8/20), 18.0 (8/21).  Free T4 0.73 (8/21) Free T3 in process  - Repeat TSH in 4 weeks and no indication to start synthroid at this time per hospitalist recommendations  Leukopenia - Recheck CBC for trending on Tuesday   WBC 3.7 with ANA 2300 (8/21)  PRN's Trazadone '50mg'$  PO qHS PRN for insomnia Hydroxyzine '25mg'$  PO TID PRN for anxiety Alum & mag hydroxide-simeth 330mPO qHS PRN for GERD Magnesium hydroxide 3068mO daily PRN for constipation Acetaminophen tablet '650mg'$  PO PRN q6hrs for mild pain   Safety and Monitoring: Voluntary admission to inpatient psychiatric unit for safety,  stabilization and treatment Daily contact with patient  to assess and evaluate symptoms and progress in treatment Patient's case to be discussed in multi-disciplinary team meeting Observation Level : q15 minute checks Vital signs: q12 hours Precautions: suicide, elopement, and assault  Discharge Planning: Social work and case management to assist with discharge planning and identification of hospital follow-up needs prior to discharge Estimated LOS: 3-4 days Discharge Concerns: Need to establish a safety plan; Medication compliance and effectiveness Discharge Goals: Return home with outpatient referrals for mental health follow-up including medication management/psychotherapy   Merrily Brittle, DO, PGY-1 03/21/2021, 3:02 PM

## 2021-03-21 NOTE — Progress Notes (Signed)
     03/20/21 2022  Vital Signs  Pulse Rate 68  BP 127/79  BP Location Right Arm  BP Method Automatic  Patient Position (if appropriate) Sitting  Oxygen Therapy  SpO2 95 %   Pt refused all scheduled medications to include Lopressor, Remeron, and Minipress.

## 2021-03-21 NOTE — Progress Notes (Addendum)
Asked by psych service to provide medication management for patient.  Psych did not feel that pt need to be evaluated in person. Only to make medication management changes as needed.  Pt's chart reviewed.  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 Appears that pt does to various ERs across the states of [place], [place], [place]. Pt usually reports falling down and had radiographic shoulder dislocations.  Pt with hx of opiate and benzo abuse.  Pt admitted recently due to SI and depression along with reported PTSD symptoms.  Per his extensive EMR records, pt has consistently reported hx of afib, CAD, DVT/PE. Eliquis 2.5 and 5 mg bid are listed on multiple encounters.  Today, pt's BP noted to be 97/61 after receiving 75 mg of po lopressor.  This is what prompted hospitalist consult for medication management.  EKG shows NSR   Recent Results (from the past 2160 hour(s))  CBC with Differential     Status: Abnormal   Collection Time: 03/15/21  2:23 PM  Result Value Ref Range   WBC 4.1 4.0 - 10.5 K/uL   RBC 3.55 (L) 4.22 - 5.81 MIL/uL   Hemoglobin 11.4 (L) 13.0 - 17.0 g/dL   HCT 34.5 (L) 39.0 - 52.0 %   MCV 97.2 80.0 - 100.0 fL   MCH 32.1 26.0 - 34.0 pg   MCHC 33.0 30.0 - 36.0 g/dL   RDW 13.8 11.5 - 15.5 %   Platelets 202 150 - 400 K/uL   nRBC 0.0 0.0 - 0.2 %   Neutrophils Relative % 65 %   Neutro Abs 2.7 1.7 - 7.7 K/uL   Lymphocytes Relative 19 %   Lymphs Abs 0.8 0.7 - 4.0 K/uL   Monocytes Relative 8 %   Monocytes Absolute 0.3 0.1 - 1.0 K/uL   Eosinophils Relative 7 %   Eosinophils Absolute 0.3 0.0 - 0.5 K/uL   Basophils Relative 1 %   Basophils Absolute 0.0 0.0 - 0.1 K/uL   Immature Granulocytes 0 %   Abs Immature Granulocytes 0.01 0.00 - 0.07 K/uL    Comment: Performed at [facility], [place] [address] [place], [place] [id number]  Basic metabolic panel     Status: Abnormal   Collection Time: 03/15/21  2:23 PM  Result Value Ref Range   Sodium 137 135 - 145  mmol/L   Potassium 3.8 3.5 - 5.1 mmol/L   Chloride 105 98 - 111 mmol/L   CO2 26 22 - 32 mmol/L   Glucose, Bld 101 (H) 70 - 99 mg/dL    Comment: Glucose reference range applies only to samples taken after fasting for at least 8 hours.   BUN 12 6 - 20 mg/dL   Creatinine, Ser 0.81 0.61 - 1.24 mg/dL   Calcium 8.9 8.9 - 10.3 mg/dL   GFR, Estimated >60 >60 mL/min    Comment: (NOTE) Calculated using the CKD-EPI Creatinine Equation (2021)    Anion gap 6 5 - 15    Comment: Performed at [facility], [place] [address] [place], [place] [address]  Troponin I (High Sensitivity)     Status: None   Collection Time: 03/15/21  2:23 PM  Result Value Ref Range   Troponin I (High Sensitivity) 4 <18 ng/L    Comment: (NOTE) Elevated high sensitivity troponin I (hsTnI) values and significant  changes across serial measurements may suggest ACS but many other  chronic and acute conditions are known to elevate hsTnI results.  Refer to the "Links" section  for chest pain algorithms and additional  guidance. Performed at [facility], [place] [address] [place], [place] [address]   Resp Panel by RT-PCR (Flu A&B, Covid) Nasopharyngeal Swab     Status: None   Collection Time: 03/19/21  3:04 AM   Specimen: Nasopharyngeal Swab; Nasopharyngeal(NP) swabs in vial transport medium  Result Value Ref Range   SARS Coronavirus 2 by RT PCR NEGATIVE NEGATIVE    Comment: (NOTE) SARS-CoV-2 target nucleic acids are NOT DETECTED.  The SARS-CoV-2 RNA is generally detectable in upper respiratory specimens during the acute phase of infection. The lowest concentration of SARS-CoV-2 viral copies this assay can detect is 138 copies/mL. A negative result does not preclude SARS-Cov-2 infection and should not be used as the sole basis for treatment or other patient management decisions. A negative result may occur with  improper specimen collection/handling, submission of specimen other than  nasopharyngeal swab, presence of viral mutation(s) within the areas targeted by this assay, and inadequate number of viral copies(<138 copies/mL). A negative result must be combined with clinical observations, patient history, and epidemiological information. The expected result is Negative.  Fact Sheet for Patients:  EntrepreneurPulse.com.au  Fact Sheet for Healthcare Providers:  IncredibleEmployment.be  This test is no t yet approved or cleared by the [place] FDA and  has been authorized for detection and/or diagnosis of SARS-CoV-2 by FDA under an Emergency Use Authorization (EUA). This EUA will remain  in effect (meaning this test can be used) for the duration of the COVID-19 declaration under Section 564(b)(1) of the Act, 21 U.S.C.section 360bbb-3(b)(1), unless the authorization is terminated  or revoked sooner.       Influenza A by PCR NEGATIVE NEGATIVE   Influenza B by PCR NEGATIVE NEGATIVE    Comment: (NOTE) The Xpert Xpress SARS-CoV-2/FLU/RSV plus assay is intended as an aid in the diagnosis of influenza from Nasopharyngeal swab specimens and should not be used as a sole basis for treatment. Nasal washings and aspirates are unacceptable for Xpert Xpress SARS-CoV-2/FLU/RSV testing.  Fact Sheet for Patients: EntrepreneurPulse.com.au  Fact Sheet for Healthcare Providers: IncredibleEmployment.be  This test is not yet approved or cleared by the [place] FDA and has been authorized for detection and/or diagnosis of SARS-CoV-2 by FDA under an Emergency Use Authorization (EUA). This EUA will remain in effect (meaning this test can be used) for the duration of the COVID-19 declaration under Section 564(b)(1) of the Act, 21 U.S.C. section 360bbb-3(b)(1), unless the authorization is terminated or revoked.  Performed at [facility], [place] [address], [place], [place]  [address]   Comprehensive metabolic panel     Status: Abnormal   Collection Time: 03/19/21  3:04 AM  Result Value Ref Range   Sodium 142 135 - 145 mmol/L   Potassium 3.5 3.5 - 5.1 mmol/L   Chloride 110 98 - 111 mmol/L   CO2 24 22 - 32 mmol/L   Glucose, Bld 118 (H) 70 - 99 mg/dL    Comment: Glucose reference range applies only to samples taken after fasting for at least 8 hours.   BUN 21 (H) 6 - 20 mg/dL   Creatinine, Ser 0.96 0.61 - 1.24 mg/dL   Calcium 8.9 8.9 - 10.3 mg/dL   Total Protein 6.7 6.5 - 8.1 g/dL   Albumin 3.8 3.5 - 5.0 g/dL   AST 32 15 - 41 U/L   ALT 37 0 - 44 U/L   Alkaline Phosphatase 79 38 - 126 U/L   Total  Bilirubin 0.4 0.3 - 1.2 mg/dL   GFR, Estimated >60 >60 mL/min    Comment: (NOTE) Calculated using the CKD-EPI Creatinine Equation (2021)    Anion gap 8 5 - 15    Comment: Performed at [facility], [place] [address], [place], [place] [address]  Ethanol     Status: None   Collection Time: 03/19/21  3:04 AM  Result Value Ref Range   Alcohol, Ethyl (B) <10 <10 mg/dL    Comment: (NOTE) Lowest detectable limit for serum alcohol is 10 mg/dL.  For medical purposes only. Performed at [facility], [place] [address] [place], [place] [address]   Urine rapid drug screen (hosp performed)     Status: None   Collection Time: 03/19/21  3:04 AM  Result Value Ref Range   Opiates NONE DETECTED NONE DETECTED   Cocaine NONE DETECTED NONE DETECTED   Benzodiazepines NONE DETECTED NONE DETECTED   Amphetamines NONE DETECTED NONE DETECTED   Tetrahydrocannabinol NONE DETECTED NONE DETECTED   Barbiturates NONE DETECTED NONE DETECTED    Comment: (NOTE) DRUG SCREEN FOR MEDICAL PURPOSES ONLY.  IF CONFIRMATION IS NEEDED FOR ANY PURPOSE, NOTIFY LAB WITHIN 5 DAYS.  LOWEST DETECTABLE LIMITS FOR URINE DRUG SCREEN Drug Class                     Cutoff (ng/mL) Amphetamine and metabolites    1000 Barbiturate and metabolites    200 Benzodiazepine                  [id number] Tricyclics and metabolites     300 Opiates and metabolites        300 Cocaine and metabolites        300 THC                            50 Performed at [facility], [place] [address], [place], [place] [address]   CBC with Diff     Status: Abnormal   Collection Time: 03/19/21  3:04 AM  Result Value Ref Range   WBC 4.6 4.0 - 10.5 K/uL   RBC 3.37 (L) 4.22 - 5.81 MIL/uL   Hemoglobin 11.0 (L) 13.0 - 17.0 g/dL   HCT 33.1 (L) 39.0 - 52.0 %   MCV 98.2 80.0 - 100.0 fL   MCH 32.6 26.0 - 34.0 pg   MCHC 33.2 30.0 - 36.0 g/dL   RDW 14.1 11.5 - 15.5 %   Platelets 218 150 - 400 K/uL   nRBC 0.0 0.0 - 0.2 %   Neutrophils Relative % 64 %   Neutro Abs 2.9 1.7 - 7.7 K/uL   Lymphocytes Relative 22 %   Lymphs Abs 1.0 0.7 - 4.0 K/uL   Monocytes Relative 9 %   Monocytes Absolute 0.4 0.1 - 1.0 K/uL   Eosinophils Relative 4 %   Eosinophils Absolute 0.2 0.0 - 0.5 K/uL   Basophils Relative 1 %   Basophils Absolute 0.0 0.0 - 0.1 K/uL   Immature Granulocytes 0 %   Abs Immature Granulocytes 0.01 0.00 - 0.07 K/uL    Comment: Performed at [facility], [place] [address], [place], [place] [address]  TSH     Status: Abnormal   Collection Time: 03/20/21  6:55 AM  Result Value Ref Range   TSH 10.401 (H) 0.350 - 4.500 uIU/mL    Comment: Performed by a 3rd Generation assay with a  functional sensitivity of <=0.01 uIU/mL. Performed at [facility], [place] [address] [place], [place] [address]   Hemoglobin A1c     Status: None   Collection Time: 03/20/21  6:55 AM  Result Value Ref Range   Hgb A1c MFr Bld 5.6 4.8 - 5.6 %    Comment: (NOTE) Pre diabetes:          5.7%-6.4%  Diabetes:              >6.4%  Glycemic control for   <7.0% adults with diabetes    Mean Plasma Glucose 114.02 mg/dL    Comment: Performed at [place] [address], [place], [place] [address]  Lipid panel     Status: None   Collection Time: 03/20/21  6:55 AM   Result Value Ref Range   Cholesterol 152 0 - 200 mg/dL   Triglycerides 97 <150 mg/dL   HDL 44 >40 mg/dL   Total CHOL/HDL Ratio 3.5 RATIO   VLDL 19 0 - 40 mg/dL   LDL Cholesterol 89 0 - 99 mg/dL    Comment:        Total Cholesterol/HDL:CHD Risk Coronary Heart Disease Risk Table                     Men   Women  1/2 Average Risk   3.4   3.3  Average Risk       5.0   4.4  2 X Average Risk   9.6   7.1  3 X Average Risk  23.4   11.0        Use the calculated Patient Ratio above and the CHD Risk Table to determine the patient's CHD Risk.        ATP III CLASSIFICATION (LDL):  <100     mg/dL   Optimal  100-129  mg/dL   Near or Above                    Optimal  130-159  mg/dL   Borderline  160-189  mg/dL   High  >190     mg/dL   Very High Performed at [place] [address], [place], [place] [address]   CBC with Differential/Platelet     Status: Abnormal   Collection Time: 03/20/21  6:55 AM  Result Value Ref Range   WBC 3.9 (L) 4.0 - 10.5 K/uL   RBC 4.00 (L) 4.22 - 5.81 MIL/uL   Hemoglobin 12.9 (L) 13.0 - 17.0 g/dL   HCT 38.9 (L) 39.0 - 52.0 %   MCV 97.3 80.0 - 100.0 fL   MCH 32.3 26.0 - 34.0 pg   MCHC 33.2 30.0 - 36.0 g/dL   RDW 13.9 11.5 - 15.5 %   Platelets 279 150 - 400 K/uL   nRBC 0.0 0.0 - 0.2 %   Neutrophils Relative % 64 %   Neutro Abs 2.5 1.7 - 7.7 K/uL   Lymphocytes Relative 20 %   Lymphs Abs 0.8 0.7 - 4.0 K/uL   Monocytes Relative 9 %   Monocytes Absolute 0.4 0.1 - 1.0 K/uL   Eosinophils Relative 6 %   Eosinophils Absolute 0.2 0.0 - 0.5 K/uL   Basophils Relative 1 %   Basophils Absolute 0.0 0.0 - 0.1 K/uL   Immature Granulocytes 0 %   Abs Immature Granulocytes 0.00 0.00 - 0.07 K/uL    Comment: Performed at [facility], [place] [address]  [place], [place] [address]  CBC with Differential/Platelet     Status: Abnormal   Collection Time: 03/21/21  6:11 AM  Result Value Ref Range   WBC 3.7 (L) 4.0 - 10.5 K/uL   RBC  3.98 (L) 4.22 - 5.81 MIL/uL   Hemoglobin 12.6 (L) 13.0 - 17.0 g/dL   HCT 38.3 (L) 39.0 - 52.0 %   MCV 96.2 80.0 - 100.0 fL   MCH 31.7 26.0 - 34.0 pg   MCHC 32.9 30.0 - 36.0 g/dL   RDW 13.7 11.5 - 15.5 %   Platelets 251 150 - 400 K/uL   nRBC 0.0 0.0 - 0.2 %   Neutrophils Relative % 63 %   Neutro Abs 2.3 1.7 - 7.7 K/uL   Lymphocytes Relative 21 %   Lymphs Abs 0.8 0.7 - 4.0 K/uL   Monocytes Relative 9 %   Monocytes Absolute 0.3 0.1 - 1.0 K/uL   Eosinophils Relative 6 %   Eosinophils Absolute 0.2 0.0 - 0.5 K/uL   Basophils Relative 1 %   Basophils Absolute 0.0 0.0 - 0.1 K/uL   Immature Granulocytes 0 %   Abs Immature Granulocytes 0.01 0.00 - 0.07 K/uL    Comment: Performed at [facility], [place] [address], [place], [place] [address]  T4, free     Status: None   Collection Time: 03/21/21  6:11 AM  Result Value Ref Range   Free T4 0.73 0.61 - 1.12 ng/dL    Comment: (NOTE) Biotin ingestion may interfere with free T4 tests. If the results are inconsistent with the TSH level, previous test results, or the clinical presentation, then consider biotin interference. If needed, order repeat testing after stopping biotin. Performed at [facility], [place] [address] [place], [place] [address]   TSH     Status: Abnormal   Collection Time: 03/21/21  6:11 AM  Result Value Ref Range   TSH 18.043 (H) 0.350 - 4.500 uIU/mL    Comment: Performed by a 3rd Generation assay with a functional sensitivity of <=0.01 uIU/mL. Performed at [facility], [place] [address], [place],  [address]     Repeat TSH are both elevated at 18.0 and 10.4, with normal free T4 and free T3.  Assessment: PAF Elevated TSH. Unclear if truly hypothryoid vs euthyroid sick syndrome.  Recommendations: Reduced lopressor to 25 mg bid due to low BP Continue Eliquis 5 mg bid. Keep serum potassium >4.0 and Magnesium >2.0. with normal Scr, can just supplement with 20 meq daily of  Kcl and 400 mg daily of Mag oxide. Repeat BMP with magnesium in 2-3 days. Pt will need repeat TSH in 4 weeks when out of psychiatric crisis. For now, would not treat with synthroid given normal FT4 and FT3.

## 2021-03-21 NOTE — BHH Group Notes (Signed)
Adult Psychoeducational Group Note  Date:  03/21/2021 Time:  11:37 AM  Group Topic/Focus:  Healthy Communication:   The focus of this group is to discuss communication, barriers to communication, as well as healthy ways to communicate with others.  Participation Level:  Active  Participation Quality:  Appropriate  Affect:  Appropriate  Cognitive:  Alert and Appropriate  Insight: Appropriate and Good  Engagement in Group:  Engaged and Supportive  Modes of Intervention:  Discussion, Socialization, and Support  Additional Comments:    Chase Picket 03/21/2021, 11:37 AM

## 2021-03-21 NOTE — Progress Notes (Signed)
   03/21/21 0614  Vital Signs  Pulse Rate 93  BP 97/61  BP Location Right Arm  BP Method Automatic  Patient Position (if appropriate) Standing  Oxygen Therapy  SpO2 95 %   Pt standing BP was slightly low.  Hydrated patient with 2 cups of Gatorade and advised patient To continue to hydrate often throughout the day.  Day shift RN advised in report.
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
## 2021-03-21 NOTE — Progress Notes (Signed)
Patient agitated pacing in hall way after Nurse offered him prescribed Magnesium and Potassium stating that " I want to know what is going on with me I need to be across the street "fuck yull' I need to speak to my Doctor now. The Provided was notified Provider came and spoke to Patient. Patient kept pacing  and yelling on to of his voice. The Nurse went and tried to deescalate Patient. At the time the Provider came and Spoke to  Patient and explained further about his labs.   Support and encouragement offered. Patient seems calm is standing at the dayroom. Will continue to monitor.

## 2021-03-21 NOTE — Progress Notes (Signed)
[place] Group Notes:  (Nursing/MHT/Case Management/Adjunct)  Date:  03/21/2021  Time:  2000  Type of Therapy:   wrap up group  Participation Level:  Active  Participation Quality:  Appropriate, Attentive, Sharing, and Supportive  Affect:  Irritable  Cognitive:  Alert  Insight:  Improving  Engagement in Group:  Engaged  Modes of Intervention:  Clarification, Education, and Support  Summary of Progress/Problems: Positive thinking and self-care were discussed.   Winfield Rast S 03/21/2021, 11:08 PM

## 2021-03-21 NOTE — BHH Suicide Risk Assessment (Signed)
[name] INPATIENT:  Family/Significant Other Suicide Prevention Education  Suicide Prevention Education:  Patient Refusal for Family/Significant Other Suicide Prevention Education: The patient Alex Sandoval has refused to provide written consent for family/significant other to be provided Family/Significant Other Suicide Prevention Education during admission and/or prior to discharge.  Physician notified.  [name] 03/21/2021, 11:44 AM

## 2021-03-21 NOTE — BHH Group Notes (Signed)
Adult Psychoeducational Group Note  Date:  03/21/2021 Time:  9:41 AM  Group Topic/Focus:  Goals Group:   The focus of this group is to help patients establish daily goals to achieve during treatment and discuss how the patient can incorporate goal setting into their daily lives to aide in recovery.  Participation Level:  Active  Participation Quality:  Appropriate and Attentive  Affect:  Appropriate  Cognitive:  Alert and Appropriate  Insight: Appropriate  Engagement in Group:  Engaged and Supportive  Modes of Intervention:  Orientation  Additional Comments:  Stay positive  Chase Picket 03/21/2021, 9:41 AM

## 2021-03-22 DIAGNOSIS — F332 Major depressive disorder, recurrent severe without psychotic features: Secondary | ICD-10-CM | POA: Diagnosis not present

## 2021-03-22 MED ORDER — ZIPRASIDONE MESYLATE 20 MG IM SOLR: 20.0000 mg | INTRAMUSCULAR | Status: DC | PRN

## 2021-03-22 MED ORDER — METOPROLOL TARTRATE 12.5 MG HALF TABLET
12.5000 mg | ORAL_TABLET | Freq: Two times a day (BID) | ORAL | Status: DC
  Administered 2021-03-22 – 2021-03-23 (×2): 12.5 mg via ORAL
  Filled 2021-03-22 (×7): qty 1

## 2021-03-22 MED ORDER — OLANZAPINE 10 MG PO TBDP
10.0000 mg | ORAL_TABLET | Freq: Three times a day (TID) | ORAL | Status: DC | PRN
Start: 2021-03-22 — End: 2021-03-23

## 2021-03-22 MED ORDER — LORAZEPAM 1 MG PO TABS: 1.0000 mg | ORAL_TABLET | ORAL | Status: DC | PRN

## 2021-03-22 NOTE — Progress Notes (Signed)
Patient has not taken any medicatons today.

## 2021-03-22 NOTE — Progress Notes (Signed)
Patient rescinded his 72 hr request for discharge this morning at 1023

## 2021-03-22 NOTE — [facility] Suicide Risk Assessment (Signed)
[place] INPATIENT:  Family/Significant Other Suicide Prevention Education  Suicide Prevention Education:  Contact Attempts: step-mother Jamy Schnitzer [phone]), has been identified by the patient as the family member/significant other with whom the patient will be residing, and identified as the person(s) who will aid the patient in the event of a mental health crisis.  With written consent from the patient, two attempts were made to provide suicide prevention education, prior to and/or following the patient's discharge.  We were unsuccessful in providing suicide prevention education.  A suicide education pamphlet was given to the patient to share with family/significant other.  Date and time of first attempt:03/22/2021 / 2:25pm CSW attempted to reach pt's step mother. CSW left HIPAA compliant voicemail requesting a return call.  Date and time of second attempt: Second attempt still needs to be made.   Illene Labrador Antionetta Ator 03/22/2021, 2:29 PM

## 2021-03-22 NOTE — Progress Notes (Addendum)
[facility] MD Progress Note  03/22/2021 1:04 PM Alex Sandoval  MRN:  [id number] Subjective:  Alex Sandoval is a 35 year old male with a past psychiatric history of PTSD polysubstance use in remission and depression and anxiety, presenting voluntarily with increasing depression in the context of the 1 year anniversary of his daughters murder.  24hr events:  The patient's chart was reviewed and nursing notes were reviewed. The patient's case was discussed in multidisciplinary team meeting.  Per MAR: - PRNs: none. None for agitation. Per RN notes, no documented behavioral issues. Patient slept Number of Hours: 5.5.   Today (03/22/2021): The patient was seen and evaluated on the unit.  When asked how his mood is he says "Well, I'm here".  When asked about the fact that he signed a 72-hour form, patient says that he will resend the form (this is confirmed by nursing staff later on in the day).  He also asks about his thyroid labs and about being placed on Synthroid.  It is explained to him that he does not currently require treatment with Synthroid.  When asked about taking supplemental magnesium and potassium the patient refuses.  He simply says "I do not want it".  He is able to repeat back the risks of not taking the medication, namely the risk of arrhythmia or conversion into A. fib.  When asked about suicidal ideation the patient denies having any suicidal thoughts.  He reports his last suicidal thought was 2 days ago.  He denies HI. He is fully alert and oriented. He denies auditory-visual hallucinations, paranoia, or delusions.  Review of systems is as below.  Principal Problem: MDD (major depressive disorder), recurrent severe, without psychosis ([place]) Diagnosis: Principal Problem:   MDD (major depressive disorder), recurrent severe, without psychosis ([place]) Active Problems:   PTSD (post-traumatic stress disorder)  Total Time spent with patient: 30 minutes  Past Psychiatric History: PTSD,  depression, anxiety, polysubstance use in remission  Past Medical History:  Past Medical History:  Diagnosis Date   Confabulation    Coronary artery disease    Drug abuse ([place])    History of pseudoseizure    Hypertension    Malingering    Personality disorder ([place])    Post traumatic stress disorder    Seizures ([facility])    Shoulder dislocation, recurrent    SELF-INDUCED IN ATTEMPT TO OBTAIN OPIATES    Past Surgical History:  Procedure Laterality Date   APPENDECTOMY     Family History: History reviewed. No pertinent family history. Family Psychiatric  History: denies Hx of schizophrenia, BPAD, suicide, or substance abuse Social History:  Social History   Substance and Sexual Activity  Alcohol Use No     Social History   Substance and Sexual Activity  Drug Use Yes   Frequency: 3.0 times per week   Comment: hx of opiate and benzo addiction/abuse    Social History   Socioeconomic History   Marital status: Single    Spouse name: Not on file   Number of children: Not on file   Years of education: Not on file   Highest education level: Not on file  Occupational History   Not on file  Tobacco Use   Smoking status: Every Day    Packs/day: 0.50    Years: 10.00    Pack years: 5.00    Types: Cigarettes    Last attempt to quit: 01/30/2016    Years since quitting: 5.1   Smokeless tobacco: Never  Substance and Sexual Activity  Alcohol use: No   Drug use: Yes    Frequency: 3.0 times per week    Comment: hx of opiate and benzo addiction/abuse   Sexual activity: Yes    Birth control/protection: None  Other Topics Concern   Not on file  Social History Narrative   ** Merged History Encounter **       Social Determinants of Health   Financial Resource Strain: Not on file  Food Insecurity: Not on file  Transportation Needs: Not on file  Physical Activity: Not on file  Stress: Not on file  Social Connections: Not on file   Additional Social History:  Patient lives  with family in a strained relationship Hx of BNZ, alcohol, and narcotic abuse     Sleep: Poor  Appetite:  fair  Current Medications: Current Facility-Administered Medications  Medication Dose Route Frequency Provider Last Rate Last Admin   acetaminophen (TYLENOL) tablet 650 mg  650 mg Oral Q6H PRN Corky Sox, MD       alum & mag hydroxide-simeth (MAALOX/MYLANTA) 200-200-20 MG/5ML suspension 15 mL  15 mL Oral Q6H PRN Corky Sox, MD       apixaban Arne Cleveland) tablet 5 mg  5 mg Oral BID Bobbitt, Shalon E, NP   5 mg at 03/21/21 1613   aspirin EC tablet 81 mg  81 mg Oral Daily Bobbitt, Shalon E, NP   81 mg at 03/21/21 0829   feeding supplement (ENSURE ENLIVE / ENSURE PLUS) liquid 237 mL  237 mL Oral BID BM Merrily Brittle, DO   237 mL at 03/21/21 1613   gabapentin (NEURONTIN) capsule 100 mg  100 mg Oral TID Harlow Asa, MD   100 mg at 03/21/21 1613   hydrOXYzine (ATARAX/VISTARIL) tablet 25 mg  25 mg Oral TID PRN Corky Sox, MD       OLANZapine zydis (ZYPREXA) disintegrating tablet 10 mg  10 mg Oral Q8H PRN Briant Cedar, MD       And   LORazepam (ATIVAN) tablet 1 mg  1 mg Oral PRN Briant Cedar, MD       And   ziprasidone (GEODON) injection 20 mg  20 mg Intramuscular PRN Briant Cedar, MD       magnesium hydroxide (MILK OF MAGNESIA) suspension 5 mL  5 mL Oral Daily PRN Corky Sox, MD       melatonin tablet 5 mg  5 mg Oral QHS Nelda Marseille, Darrek Leasure E, MD   5 mg at 03/21/21 2116   metoprolol tartrate (LOPRESSOR) tablet 12.5 mg  12.5 mg Oral BID Antonieta Pert, MD       mirtazapine (REMERON) tablet 15 mg  15 mg Oral QHS Nelda Marseille, Keyshun Elpers E, MD   15 mg at 03/21/21 2116   prazosin (MINIPRESS) capsule 1 mg  1 mg Oral QHS Harlow Asa, MD   1 mg at 03/21/21 2117    Lab Results:  Results for orders placed or performed during the hospital encounter of 03/19/21 (from the past 48 hour(s))  CBC with Differential/Platelet     Status: Abnormal   Collection Time:  03/21/21  6:11 AM  Result Value Ref Range   WBC 3.7 (L) 4.0 - 10.5 K/uL   RBC 3.98 (L) 4.22 - 5.81 MIL/uL   Hemoglobin 12.6 (L) 13.0 - 17.0 g/dL   HCT 38.3 (L) 39.0 - 52.0 %   MCV 96.2 80.0 - 100.0 fL   MCH 31.7 26.0 - 34.0 pg   MCHC 32.9 30.0 - 36.0  g/dL   RDW 13.7 11.5 - 15.5 %   Platelets 251 150 - 400 K/uL   nRBC 0.0 0.0 - 0.2 %   Neutrophils Relative % 63 %   Neutro Abs 2.3 1.7 - 7.7 K/uL   Lymphocytes Relative 21 %   Lymphs Abs 0.8 0.7 - 4.0 K/uL   Monocytes Relative 9 %   Monocytes Absolute 0.3 0.1 - 1.0 K/uL   Eosinophils Relative 6 %   Eosinophils Absolute 0.2 0.0 - 0.5 K/uL   Basophils Relative 1 %   Basophils Absolute 0.0 0.0 - 0.1 K/uL   Immature Granulocytes 0 %   Abs Immature Granulocytes 0.01 0.00 - 0.07 K/uL    Comment: Performed at [facility], [place] [address] [place], [place] [address]  T4, free     Status: None   Collection Time: 03/21/21  6:11 AM  Result Value Ref Range   Free T4 0.73 0.61 - 1.12 ng/dL    Comment: (NOTE) Biotin ingestion may interfere with free T4 tests. If the results are inconsistent with the TSH level, previous test results, or the clinical presentation, then consider biotin interference. If needed, order repeat testing after stopping biotin. Performed at [facility], [place] [address], [place], [place] [address]   TSH     Status: Abnormal   Collection Time: 03/21/21  6:11 AM  Result Value Ref Range   TSH 18.043 (H) 0.350 - 4.500 uIU/mL    Comment: Performed by a 3rd Generation assay with a functional sensitivity of <=0.01 uIU/mL. Performed at [facility], [place] [address] [place], [place] [address]   Magnesium     Status: None   Collection Time: 03/21/21  6:31 PM  Result Value Ref Range   Magnesium 2.4 1.7 - 2.4 mg/dL    Comment: Performed at [facility], [place] [address], [place],  [address]    Blood Alcohol level:  Lab Results  Component Value Date   [facility]  <10 03/19/2021   ETH <11 [id number]    Metabolic Disorder Labs: Lab Results  Component Value Date   HGBA1C 5.6 03/20/2021   MPG 114.02 03/20/2021   No results found for: PROLACTIN Lab Results  Component Value Date   CHOL 152 03/20/2021   TRIG 97 03/20/2021   HDL 44 03/20/2021   CHOLHDL 3.5 03/20/2021   VLDL 19 03/20/2021   LDLCALC 89 03/20/2021    Physical Findings:   Musculoskeletal: Strength & Muscle Tone: within normal limits Gait & Station: normal, steady Patient leans: N/A  Psychiatric Specialty Exam:  Presentation  General Appearance: Casual; Fairly Groomed , adequate hygiene  Eye Contact:Good  Speech:Clear and Coherent; Normal Rate  Speech Volume:Decreased  Handedness: Right  Mood and Affect  Mood:Dysphoric, irritable  Affect:constricted, irritable   Thought Process  Thought Processes:superficially goal directed but evasive and vague  Descriptions of Associations:Intact  Orientation:Full (Time, Place and Person)  Thought Content:Logical Patient denied SI/HI/AVH, delusions, paranoia, first rank symptoms. Patient is not grossly responding to internal/external stimuli on exam and did not make delusional statements.  History of Schizophrenia/Schizoaffective disorder:No  Duration of Psychotic Symptoms: NA Hallucinations:Hallucinations: None  Ideas of Reference:None  Suicidal Thoughts:Suicidal Thoughts: No  Homicidal Thoughts:Homicidal Thoughts: No   Sensorium  Memory:Immediate Good; Recent Good; Remote Good  Judgment:Fair  Insight:Fair   Executive Functions  Concentration:Good  Attention Span:Good  [place] of Knowledge:Good  Language:Good   Psychomotor Activity  Psychomotor Activity:Psychomotor Activity: Normal   Assets  Assets:Desire for Improvement; Housing; Social Support   Sleep  Sleep:Sleep: Poor Number of Hours of Sleep: 5    Physical Exam: Physical Exam Vitals and nursing note reviewed.   Constitutional:      Appearance: Normal appearance.  HENT:     Head: Normocephalic and atraumatic.  Pulmonary:     Effort: Pulmonary effort is normal.  Neurological:     General: No focal deficit present.     Mental Status: He is alert and oriented to person, place, and time.   Review of Systems  Respiratory:  Negative for shortness of breath.   Cardiovascular:  Negative for chest pain.  Gastrointestinal:  Negative for abdominal pain.  Neurological:  Negative for dizziness and headaches.  Blood pressure (!) 87/56, pulse (!) 117, temperature 97.9 F (36.6 C), temperature source Oral, resp. rate 16, height 6' (1.829 m), weight 104.3 kg, SpO2 98 %. Body mass index is 31.19 kg/m.   Treatment Plan Summary: Daily contact with patient to assess and evaluate symptoms and progress in treatment and Medication management  Owynn Thwaites is a 35 year old male with a past psychiatric history of PTSD polysubstance use in remission and depression and anxiety, presenting voluntarily with increasing depression in the context of the 1 year anniversary of his daughters murder. [facility] stay day 3.     ASSESSMENT: Diagnosis:  MDD Severe, in the context of PTSD Self-reported hx of afib Elevated TSH Leukopenia  TODAY (03/22/2021): Hospitalist contacted and spoke with Dr. Nelda Marseille and recommends reduction in Metoprolol to 12.'5mg'$  bid and holding of Prazosin given BP trends.   PSYCHIATRIC DIAGNOSIS & TREATMENT:  MDD recurrent Severe,without psychotic features PTSD -Continue Remeron 15 mg QHS - compliance encouraged             -Patient counseled on r/b/se of medication -Hold Prazosin given low BP  -Continue Neurontin 100 mg p.o. 3 times daily to help with anxiety and reported neuropathic pain -Lexapro discontinued -Supportive psychotherapy - Would benefit from trauma focused therapy after discharge -Continue Ensure  - SW to assist with safety planning and patient encouraged to allow team to talk  with someone for discharge/safety planning   MEDICAL MANAGEMENT: Covid negative CMP:  unremarkable CBC: WBC 3.9 with ANC 2500; H/H 12.9/38.9 and platelets 279 (see below) EtOH: <10 UDS: neg TSH: 10.4 (see below) A1C: 5.6 Lipids: WNL Mag 2.4  Afib Self reported Hx of Afib EKG NSR with Qtc 393 (8/19) Medicine consulted, recommendations as below: -Continue Eliquis 5 mg BID -Decrease to metoprolol 12.5 mg BID due to low BP, we will continue to monitor blood pressure -Continue ASA 81 mg -Recommend starting Kcl 20 meq daily and Mg ox 400 mg daily to keep K>4, Mg>2. But patient refusing Mag or K supplementation at this time -Recheck BMP and Mag on Tuesday for trending  Leukopenia WBCs 4.1 on admission -> 3.7 (8/21) ANC stable -Recheck CBC 8/23 AM for trending and will need outpatient f/u after discharge  Elevated TSH Patient reports previous treatment with Synthroid. Per medicine consult, unsure if true hypothyroid vs. Euthyroid sick syndrome TSH of 10.4 (8/20), 18.0 (8/21).  Free T4 0.73 (8/21) Free T3 in process  - Repeat TSH in 4 weeks and no indication to start synthroid at this time per hospitalist recommendations  PRN's Trazadone '50mg'$  PO qHS PRN for insomnia - D/C'd given low BP Hydroxyzine '25mg'$  PO TID PRN for anxiety Alum & mag hydroxide-simeth 41m PO qHS PRN for GERD Magnesium hydroxide 329mPO daily PRN for constipation Acetaminophen  tablet '650mg'$  PO PRN q6hrs for mild pain   Safety and Monitoring: Voluntary admission to inpatient psychiatric unit for safety, stabilization and treatment Daily contact with patient to assess and evaluate symptoms and progress in treatment Patient's case to be discussed in multi-disciplinary team meeting Observation Level : q15 minute checks Vital signs: q12 hours Precautions: suicide, elopement, and assault  Discharge Planning: Social work and case management to assist with discharge planning and identification of hospital follow-up  needs prior to discharge Estimated LOS: 1-2 days Discharge Concerns: Need to establish a safety plan; Medication compliance and effectiveness Discharge Goals: Return home with outpatient referrals for mental health follow-up including medication management/psychotherapy   Corky Sox PGY-1, Psychiatry

## 2021-03-22 NOTE — Progress Notes (Signed)
Nurse discussed anxiety, depression and coping skills with patient.  

## 2021-03-22 NOTE — [facility] Group Notes (Signed)
LCSW Group Therapy Note  '@TD'$ @    11:00am-12:00pm  Type of Therapy and Topic:  Group Therapy: Anger and Coping Skills  Participation Level:  Active   Description of Group:   In this group, patients learned how to recognize the physical, cognitive, emotional, and behavioral responses they have to anger-provoking situations.  They identified how they usually or often react when angered, and learned how healthy and unhealthy coping skills work initially, but the unhealthy ones stop working.   They analyzed how their frequently-chosen coping skill is possibly beneficial and how it is possibly unhelpful.  The group discussed a variety of healthier coping skills that could help in resolving the actual issues, as well as how to go about planning for the the possibility of future similar situations.  Therapeutic Goals: Patients will identify one thing that makes them angry and how they feel emotionally and physically, what their thoughts are or tend to be in those situations, and what healthy or unhealthy coping mechanism they typically use Patients will identify how their coping technique works for them, as well as how it works against them. Patients will explore possible new behaviors to use in future anger situations. Patients will learn that anger itself is normal and cannot be eliminated, and that healthier coping skills can assist with resolving conflict rather than worsening situations.  Summary of Patient Progress:  Patient engaged appropriately in group.  Patient discussed that when he is angry sometimes he can lash out. Patient discussed different ways to cope with anger including venting to others and exercise.  Patient participated in discussion about the positive aspects of anger including developing passions and creating motivation but that lashing out almost always causes more stress.  Patient participated in alternative ways to cope with anger and was in agreement of working on coping skills  to cope with anger.   Therapeutic Modalities:   Cognitive Behavioral Therapy Motivation Interviewing  Kalvyn Desa, LCSW, [place] Social Worker  [facility]

## 2021-03-22 NOTE — [facility] Group Notes (Signed)
Patient did not attend group.    Spiritual care group on grief and loss facilitated by chaplain Katy Mariena Meares, [facility]   Group Goal:   Support / Education around grief and loss   Members engage in facilitated group support and psycho-social education.   Group Description:   Following introductions and group rules, group members engaged in facilitated group dialog and support around topic of loss, with particular support around experiences of loss in their lives. Group Identified types of loss (relationships / self / things) and identified patterns, circumstances, and changes that precipitate losses. Reflected on thoughts / feelings around loss, normalized grief responses, and recognized variety in grief experience. Group noted Worden's four tasks of grief in discussion.   Group drew on Adlerian / Rogerian, narrative, MI,    

## 2021-03-22 NOTE — Tx Team (Signed)
Interdisciplinary Treatment and Diagnostic Plan Update  03/22/2021 Time of Session: 11:35am HULAN SZUMSKI MRN: [id number]  Principal Diagnosis: MDD (major depressive disorder), recurrent severe, without psychosis ([place])  Secondary Diagnoses: Principal Problem:   MDD (major depressive disorder), recurrent severe, without psychosis ([place]) Active Problems:   PTSD (post-traumatic stress disorder)   Current Medications:  Current Facility-Administered Medications  Medication Dose Route Frequency Provider Last Rate Last Admin   acetaminophen (TYLENOL) tablet 650 mg  650 mg Oral Q6H PRN Corky Sox, MD       alum & mag hydroxide-simeth (MAALOX/MYLANTA) 200-200-20 MG/5ML suspension 15 mL  15 mL Oral Q6H PRN Corky Sox, MD       apixaban Arne Cleveland) tablet 5 mg  5 mg Oral BID Bobbitt, Shalon E, NP   5 mg at 03/21/21 1613   aspirin EC tablet 81 mg  81 mg Oral Daily Bobbitt, Shalon E, NP   81 mg at 03/21/21 0829   feeding supplement (ENSURE ENLIVE / ENSURE PLUS) liquid 237 mL  237 mL Oral BID BM Merrily Brittle, DO   237 mL at 03/21/21 1613   gabapentin (NEURONTIN) capsule 100 mg  100 mg Oral TID Harlow Asa, MD   100 mg at 03/21/21 1613   hydrOXYzine (ATARAX/VISTARIL) tablet 25 mg  25 mg Oral TID PRN Corky Sox, MD       magnesium hydroxide (MILK OF MAGNESIA) suspension 5 mL  5 mL Oral Daily PRN Corky Sox, MD       melatonin tablet 5 mg  5 mg Oral QHS Nelda Marseille, Amy E, MD   5 mg at 03/21/21 2116   metoprolol tartrate (LOPRESSOR) tablet 12.5 mg  12.5 mg Oral BID Kc, Maren Beach, MD       mirtazapine (REMERON) tablet 15 mg  15 mg Oral QHS Nelda Marseille, Amy E, MD   15 mg at 03/21/21 2116   prazosin (MINIPRESS) capsule 1 mg  1 mg Oral QHS Nelda Marseille, Amy E, MD   1 mg at 03/21/21 2117   PTA Medications: Medications Prior to Admission  Medication Sig Dispense Refill Last Dose   apixaban (ELIQUIS) 5 MG TABS tablet Take 5 mg by mouth in the morning and at bedtime.      aspirin EC 81 MG  tablet Take 81 mg by mouth daily.      Metoprolol Tartrate 75 MG TABS Take 75 mg by mouth in the morning and at bedtime.       Patient Stressors: Financial difficulties Health problems Traumatic event  Patient Strengths: Ability for insight Capable of independent living Communication skills Motivation for treatment/growth  Treatment Modalities: Medication Management, Group therapy, Case management,  1 to 1 session with clinician, Psychoeducation, Recreational therapy.   Physician Treatment Plan for Primary Diagnosis: MDD (major depressive disorder), recurrent severe, without psychosis ([place]) Long Term Goal(s): Improvement in symptoms so as ready for discharge   Short Term Goals: Ability to identify triggers associated with substance abuse/mental health issues will improve Ability to demonstrate self-control will improve Ability to identify and develop effective coping behaviors will improve  Medication Management: Evaluate patient's response, side effects, and tolerance of medication regimen.  Therapeutic Interventions: 1 to 1 sessions, Unit Group sessions and Medication administration.  Evaluation of Outcomes: Progressing  Physician Treatment Plan for Secondary Diagnosis: Principal Problem:   MDD (major depressive disorder), recurrent severe, without psychosis ([place]) Active Problems:   PTSD (post-traumatic stress disorder)  Long Term Goal(s): Improvement in symptoms so as ready for discharge   Short Term  Goals: Ability to identify triggers associated with substance abuse/mental health issues will improve Ability to demonstrate self-control will improve Ability to identify and develop effective coping behaviors will improve     Medication Management: Evaluate patient's response, side effects, and tolerance of medication regimen.  Therapeutic Interventions: 1 to 1 sessions, Unit Group sessions and Medication administration.  Evaluation of Outcomes: Progressing   RN  Treatment Plan for Primary Diagnosis: MDD (major depressive disorder), recurrent severe, without psychosis ([place]) Long Term Goal(s): Knowledge of disease and therapeutic regimen to maintain health will improve  Short Term Goals: Ability to remain free from injury will improve, Ability to verbalize frustration and anger appropriately will improve, Ability to demonstrate self-control, Ability to participate in decision making will improve, Ability to identify and develop effective coping behaviors will improve, and Compliance with prescribed medications will improve  Medication Management: RN will administer medications as ordered by provider, will assess and evaluate patient's response and provide education to patient for prescribed medication. RN will report any adverse and/or side effects to prescribing provider.  Therapeutic Interventions: 1 on 1 counseling sessions, Psychoeducation, Medication administration, Evaluate responses to treatment, Monitor vital signs and CBGs as ordered, Perform/monitor CIWA, COWS, AIMS and Fall Risk screenings as ordered, Perform wound care treatments as ordered.  Evaluation of Outcomes: Not Met   LCSW Treatment Plan for Primary Diagnosis: MDD (major depressive disorder), recurrent severe, without psychosis ([place]) Long Term Goal(s): Safe transition to appropriate next level of care at discharge, Engage patient in therapeutic group addressing interpersonal concerns.  Short Term Goals: Engage patient in aftercare planning with referrals and resources, Increase social support, Increase ability to appropriately verbalize feelings, Facilitate acceptance of mental health diagnosis and concerns, Identify triggers associated with mental health/substance abuse issues, and Increase skills for wellness and recovery  Therapeutic Interventions: Assess for all discharge needs, 1 to 1 time with Social worker, Explore available resources and support systems, Assess for adequacy in  community support network, Educate family and significant other(s) on suicide prevention, Complete Psychosocial Assessment, Interpersonal group therapy.  Evaluation of Outcomes: Not Met   Progress in Treatment: Attending groups: Yes. Participating in groups: Yes. Taking medication as prescribed: Yes. Toleration medication: Yes. Family/Significant other contact made: No, will contact:  if consent is provided Patient understands diagnosis: Yes. Discussing patient identified problems/goals with staff: Yes. Medical problems stabilized or resolved: Yes. Denies suicidal/homicidal ideation: Yes. Issues/concerns per patient self-inventory: No.   New problem(s) identified: No, Describe:  none  New Short Term/Long Term Goal(s): medication stabilization, elimination of SI thoughts, development of comprehensive mental wellness plan.    Patient Goals:  "To go home. I wanted treatment for PTSD, but I'm tired of the negativity here."  Discharge Plan or Barriers: Pt is to return home at discharge. Pt is to be set up with therapy and medication management at discharge.   Reason for Continuation of Hospitalization: Depression Medication stabilization Suicidal ideation  Estimated Length of Stay: 3-5 days  Attendees: Patient: Alex Sandoval 03/22/2021 11:09 AM  Physician: Fatima Sanger, MD 03/22/2021 11:09 AM  Nursing: Tory Emerald, RN 03/22/2021 11:09 AM  RN Care Manager: 03/22/2021 11:09 AM  Social Worker: Darletta Moll, LCSW 03/22/2021 11:09 AM  Recreational Therapist: Victorino Sparrow, NT 03/22/2021 11:09 AM  Other:  03/22/2021 11:09 AM  Other:  03/22/2021 11:09 AM  Other: 03/22/2021 11:09 AM    Scribe for Treatment Team: Vassie Moselle, LCSW 03/22/2021 11:19 AM

## 2021-03-22 NOTE — Progress Notes (Signed)
Recreation Therapy Notes  Date: 8.22.22 Time: 0930 Location: [address] Dayroom   Group Topic: Stress Management    Goal Area(s) Addresses:  Patient will actively participate in stress management techniques presented during session.  Patient will successfully identify benefit of practicing stress management post d/c.    Intervention: Relaxation exercise with ambient sound and script   Activity: Guided Imagery. LRT provided education, instruction, and demonstration on practice of visualization via guided imagery. Patient was asked to participate in the technique introduced during session.  Patients were given suggestions of ways to access scripts post d/c and encouraged to explore Youtube and other apps available on smartphones, tablets, and computers.   Education:  Stress Management, Discharge Planning.    Education Outcome:  Acknowledges education   Clinical Observations/Feedback: Patient did not attend group session.         Victorino Sparrow, LRT/CTRS         Victorino Sparrow A 03/22/2021 12:23 PM

## 2021-03-22 NOTE — Progress Notes (Signed)
Patient refused all medications. Low BP 87/56 P117, metropolol not given.

## 2021-03-22 NOTE — [facility] Group Notes (Addendum)
Pt attended AA meeting this evening.  

## 2021-03-22 NOTE — Progress Notes (Signed)
Pt concerned about his low blood pressure at times , pt visible on the unit much of the evening    03/22/21 2300  Psych Admission Type (Psych Patients Only)  Admission Status Voluntary  Psychosocial Assessment  Patient Complaints Anxiety  Eye Contact Brief  Facial Expression Flat;Pensive  Affect Anxious  Speech Logical/coherent  Interaction Assertive  Motor Activity Other (Comment) (wdl)  Appearance/Hygiene Unremarkable  Behavior Characteristics Anxious  Mood Anxious  Thought Process  Coherency WDL  Content WDL  Delusions None reported or observed  Perception WDL  Hallucination None reported or observed  Judgment Poor  Confusion None  Danger to Self  Current suicidal ideation? Denies  Danger to Others  Danger to Others None reported or observed

## 2021-03-22 NOTE — Progress Notes (Signed)
D"  Patient denied SI and HI, contracts for safety.  Denied A/V hallucinations.   A:  Patient did take some medications at 1700.  Patient went to dining room for noon and dinner meals. Patient stayed in bed all morning. R:  Safety maintained with 15 minute checks.

## 2021-03-23 DIAGNOSIS — F4310 Post-traumatic stress disorder, unspecified: Secondary | ICD-10-CM

## 2021-03-23 DIAGNOSIS — F332 Major depressive disorder, recurrent severe without psychotic features: Secondary | ICD-10-CM | POA: Diagnosis not present

## 2021-03-23 LAB — T3, FREE: T3, Free: 2.8 pg/mL (ref 2.0–4.4)

## 2021-03-23 MED ORDER — ZIPRASIDONE MESYLATE 20 MG IM SOLR: 20.0000 mg | Freq: Two times a day (BID) | INTRAMUSCULAR | Status: DC | PRN

## 2021-03-23 MED ORDER — MELATONIN 5 MG PO TABS: 5.0000 mg | ORAL_TABLET | Freq: Every day | ORAL | 0 refills | Status: AC

## 2021-03-23 MED ORDER — METOPROLOL TARTRATE 25 MG PO TABS: 12.5000 mg | ORAL_TABLET | Freq: Two times a day (BID) | ORAL | 0 refills | Status: AC

## 2021-03-23 MED ORDER — MIRTAZAPINE 15 MG PO TABS: 15.0000 mg | ORAL_TABLET | Freq: Every day | ORAL | 0 refills | Status: AC

## 2021-03-23 MED ORDER — ASPIRIN 81 MG PO TBEC: 81.0000 mg | DELAYED_RELEASE_TABLET | Freq: Every day | ORAL | 0 refills | Status: AC

## 2021-03-23 MED ORDER — OLANZAPINE 10 MG PO TBDP: 10.0000 mg | ORAL_TABLET | Freq: Three times a day (TID) | ORAL | Status: DC | PRN

## 2021-03-23 MED ORDER — APIXABAN 5 MG PO TABS: 5.0000 mg | ORAL_TABLET | Freq: Two times a day (BID) | ORAL | 0 refills | Status: AC

## 2021-03-23 MED ORDER — LORAZEPAM 1 MG PO TABS: 1.0000 mg | ORAL_TABLET | Freq: Four times a day (QID) | ORAL | Status: DC | PRN

## 2021-03-23 MED ORDER — GABAPENTIN 100 MG PO CAPS: 100.0000 mg | ORAL_CAPSULE | Freq: Three times a day (TID) | ORAL | 0 refills | Status: AC

## 2021-03-23 NOTE — Progress Notes (Signed)
Pt up irritable , pt refused to get his vital signs and blood work. Pt stated "I woke up and didn't know where I was because ya'll chose not to give me my medicine" "everyone want to get loud with me , I'm see about getting ya'll some days off"

## 2021-03-23 NOTE — Progress Notes (Signed)
D:  Patient's self inventory sheet, patient has poor sleep, poor appetite, normal energy level, good concentration.  Depression #2, denied hopeless, anxiety 2.  Denied withdrawals.  Denied SI.  Denied physical problems.  Denied physical pain.  Goal is discharge.  Plans to talk to MDs.  No discharge plans. A:  Medications administered per MD orders.  Emotional support and encouragement given patient. R:  Denied SI and HI, contracts for safety.  Denied A/V hallucinations.  Safety maintained per MD orders.

## 2021-03-23 NOTE — [facility] Counselor (Signed)
CSW spoke with Alex Sandoval [phone] [facility]) who states that her Nephew can come live with her after discharge.  She states that she lives at [address] [place], [place] [address].  [name] states that she has no weapons or firearms in the home.

## 2021-03-23 NOTE — Discharge Summary (Signed)
Physician Discharge Summary Note  Patient:  Alex Sandoval is an 35 y.o., male MRN:  [id number] DOB:  02-Apr-1986 Patient phone:  [phone] (home)  Patient address:   [address] [place] [address],  Total Time spent with patient: 30 minutes  Date of Admission:  03/19/2021 Date of Discharge: 03/23/2021  Reason for Admission:  (From MD's admission note): Alex Sandoval is a 35 year old male with a past psychiatric history of PTSD polysubstance use in remission and depression and anxiety, presenting voluntarily with increasing depression in the context of the 1 year anniversary of his daughters murder.   Per chart review, patient presented to [facility] emergency department on 8/19 at 5 AM complaining of worsening depression, anxiety, and PTSD symptoms on the 1 year anniversary of his daughter's death.  Patient reports that he called the crisis line and was told to come to his nearest ED.  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  He reports being sober for the past 1 year.  Also per chart review, patient had a previous 3-day hospitalization at the behavioral health hospital primarily for substance abuse and depression.  He was prescribed Elavil during that period of time.  The charted past medical history also includes a history of a pseudoseizure malingering and intentional shoulder dislocation.   On interview with this provider patient is guarded and appears irritated. He confirms the above history and reports significant PTSD symptoms of flashbacks, nightmares, VH at nighttime, and hypervigilance. He reported never having worked with a trauma therapist, but says that "anything is worth a try at this point". He denies suicidal ideation and says that his last SI occurred 2 days ago.  He is almost pan positive for depressive symptoms, with the only intact function being his ability to concentrate when reading novels. He reports a previous medication trial with  Remeron that helped him sleep much better and he is interested in trying this medication again. He reports never having taken Lexapro previously.   The patient refuses collateral contact for this provider.    Principal Problem: MDD (major depressive disorder), recurrent severe, without psychosis ([name]) Discharge Diagnoses: Principal Problem:   MDD (major depressive disorder), recurrent severe, without psychosis ([name]) Active Problems:   PTSD (post-traumatic stress disorder)   Past Psychiatric History: See H&P  Past Medical History:  Past Medical History:  Diagnosis Date   Confabulation    Coronary artery disease    Drug abuse ([place])    History of pseudoseizure    Hypertension    Malingering    Personality disorder ([place])    Post traumatic stress disorder    Seizures ([facility])    Shoulder dislocation, recurrent    SELF-INDUCED IN ATTEMPT TO OBTAIN OPIATES    Past Surgical History:  Procedure Laterality Date   APPENDECTOMY     Family History: History reviewed. No pertinent family history. Family Psychiatric  History: See H&P Social History:  Social History   Substance and Sexual Activity  Alcohol Use No     Social History   Substance and Sexual Activity  Drug Use Yes   Frequency: 3.0 times per week   Comment: hx of opiate and benzo addiction/abuse    Social History   Socioeconomic History   Marital status: Single    Spouse name: Not on file   Number of children: Not on file   Years of education: Not on file   Highest education level: Not on file  Occupational History   Not  on file  Tobacco Use   Smoking status: Every Day    Packs/day: 0.50    Years: 10.00    Pack years: 5.00    Types: Cigarettes    Last attempt to quit: 01/30/2016    Years since quitting: 5.1   Smokeless tobacco: Never  Substance and Sexual Activity   Alcohol use: No   Drug use: Yes    Frequency: 3.0 times per week    Comment: hx of opiate and benzo addiction/abuse   Sexual activity: Yes     Birth control/protection: None  Other Topics Concern   Not on file  Social History Narrative   ** Merged History Encounter **       Social Determinants of Health   Financial Resource Strain: Not on file  Food Insecurity: Not on file  Transportation Needs: Not on file  Physical Activity: Not on file  Stress: Not on file  Social Connections: Not on file    Hospital Course:  After the above admission evaluation, Alex Sandoval's presenting symptoms were noted. He was recommended for mood stabilization treatments. The medication regimen targeting those presenting symptoms were discussed with him & initiated with his consent. His UDS on arrival to the ED was, negative, BAL was negative.He was however medicated, stabilized & discharged on the medications as listed on his discharge medication list below. Besides the mood stabilization treatments, Alex Sandoval was also enrolled & participated in the group counseling sessions being offered & held on this unit. He learned coping skills. He/She presented no other significant pre-existing medical issues that required treatment. He tolerated his treatment regimen without any adverse effects or reactions reported.   During the course of his hospitalization, the 15-minute checks were adequate to ensure patient's safety. Alex Sandoval did not display any dangerous, violent or suicidal behavior on the unit.  He interacted with patients & staff appropriately, participated appropriately in the group sessions/therapies. His medications were addressed & adjusted to meet his needs. He was recommended for outpatient follow-up care & medication management upon discharge to assure continuity of care & mood stability.  At the time of discharge patient is not reporting any acute suicidal/homicidal ideations. He feels more confident about his self-care & in managing his mental health. He currently denies any new issues or concerns. Education and supportive counseling provided throughout  his hospital stay & upon discharge.   Today upon his discharge evaluation with the attending psychiatrist, Alex Sandoval shares he is doing well. He denies any other specific concerns. He is sleeping well. His appetite is good. He denies other physical complaints. He denies AH/VH, delusional thoughts or paranoia. He does not appear to be responding to any internal stimuli. He feels that his medications have been helpful & is in agreement to continue his current treatment regimen as recommended. He was able to engage in safety planning including plan to return to [facility] or contact emergency services if he feels unable to maintain his own safety or the safety of others. Pt had no further questions, comments, or concerns. He left [facility] with all personal belongings in no apparent distress. Transportation to his aunt's per [employer].    Physical Findings: AIMS: Facial and Oral Movements Muscles of Facial Expression: None, normal Lips and Perioral Area: None, normal Jaw: None, normal Tongue: None, normal,Extremity Movements Upper (arms, wrists, hands, fingers): None, normal Lower (legs, knees, ankles, toes): None, normal, Trunk Movements Neck, shoulders, hips: None, normal, Overall Severity Severity of abnormal movements (highest score from questions above): None, normal Incapacitation  due to abnormal movements: None, normal Patient's awareness of abnormal movements (rate only patient's report): No Awareness, Dental Status Current problems with teeth and/or dentures?: No Does patient usually wear dentures?: No  CIWA:    COWS:     Musculoskeletal: Strength & Muscle Tone: within normal limits Gait & Station: normal Patient leans: N/A   Psychiatric Specialty Exam:  Presentation  General Appearance: Appropriate for Environment; Casual  Eye Contact:Good  Speech:Clear and Coherent; Normal Rate  Speech Volume:Normal  Handedness:Right   Mood and Affect   Mood:Euthymic  Affect:Appropriate; Congruent   Thought Process  Thought Processes:Coherent; Linear  Descriptions of Associations:Intact  Orientation:Full (Time, Place and Person)  Thought Content:Logical  History of Schizophrenia/Schizoaffective disorder:No  Duration of Psychotic Symptoms:No data recorded Hallucinations:Hallucinations: None  Ideas of Reference:None  Suicidal Thoughts:Suicidal Thoughts: No  Homicidal Thoughts:Homicidal Thoughts: No   Sensorium  Memory:Immediate Good; Recent Good; Remote Good  Judgment:Fair  Insight:Shallow   Executive Functions  Concentration:Good  Attention Span:Good  [place] of Knowledge:Good  Language:Good   Psychomotor Activity  Psychomotor Activity:Psychomotor Activity: Normal   Assets  Assets:Communication Skills; Desire for Improvement; Housing; Leisure Time; Physical Health; Resilience; Social Support   Sleep  Sleep:Sleep: Good Number of Hours of Sleep: 6.5    Physical Exam: Physical Exam Vitals and nursing note reviewed.  Constitutional:      Appearance: Normal appearance.  HENT:     Head: Normocephalic and atraumatic.  Pulmonary:     Effort: Pulmonary effort is normal.  Musculoskeletal:        General: Normal range of motion.     Cervical back: Normal range of motion.  Neurological:     General: No focal deficit present.     Mental Status: He is alert and oriented to person, place, and time.  Psychiatric:        Attention and Perception: Attention and perception normal.        Mood and Affect: Mood normal.        Speech: Speech normal.        Behavior: Behavior normal. Behavior is cooperative.        Thought Content: Thought content normal. Thought content is not paranoid or delusional. Thought content does not include homicidal or suicidal ideation. Thought content does not include homicidal or suicidal plan.        Cognition and Memory: Cognition normal.   Review of Systems   Constitutional:  Negative for fever.  HENT:  Negative for congestion, sinus pain and sore throat.   Respiratory:  Negative for cough and shortness of breath.   Cardiovascular:  Negative for chest pain.  Gastrointestinal: Negative.   Genitourinary: Negative.   Musculoskeletal: Negative.   Neurological: Negative.    Blood pressure 100/79, pulse 77, temperature 98.1 F (36.7 C), temperature source Oral, resp. rate 18, height 6' (1.829 m), weight 104.3 kg, SpO2 98 %. Body mass index is 31.19 kg/m.   Social History   Tobacco Use  Smoking Status Every Day   Packs/day: 0.50   Years: 10.00   Pack years: 5.00   Types: Cigarettes   Last attempt to quit: 01/30/2016   Years since quitting: 5.1  Smokeless Tobacco Never   Tobacco Cessation:  N/A, patient does not currently use tobacco products   Blood Alcohol level:  Lab Results  Component Value Date   [facility] <10 03/19/2021   ETH <11 [id number]    Metabolic Disorder Labs:  Lab Results  Component Value Date   HGBA1C 5.6 03/20/2021  MPG 114.02 03/20/2021   No results found for: PROLACTIN Lab Results  Component Value Date   CHOL 152 03/20/2021   TRIG 97 03/20/2021   HDL 44 03/20/2021   CHOLHDL 3.5 03/20/2021   VLDL 19 03/20/2021   LDLCALC 89 03/20/2021    See Psychiatric Specialty Exam and Suicide Risk Assessment completed by Attending Physician prior to discharge.  Discharge destination:  Home  Is patient on multiple antipsychotic therapies at discharge:  No   Has Patient had three or more failed trials of antipsychotic monotherapy by history:  No  Recommended Plan for Multiple Antipsychotic Therapies: NA  Discharge Instructions     Diet - low sodium heart healthy   Complete by: As directed    Increase activity slowly   Complete by: As directed       Allergies as of 03/23/2021       Reactions   Benadryl [diphenhydramine Hcl] Anaphylaxis   Penicillins Anaphylaxis   Has patient had a PCN reaction causing  immediate rash, facial/tongue/throat swelling, SOB or lightheadedness with hypotension: Yes Has patient had a PCN reaction causing severe rash involving mucus membranes or skin necrosis: No Has patient had a PCN reaction that required hospitalization No Has patient had a PCN reaction occurring within the last 10 years: Yes If all of the above answers are "NO", then may proceed with Cephalosporin use.   Ketamine Hypertension        Medication List     TAKE these medications      Indication  apixaban 5 MG Tabs tablet Commonly known as: ELIQUIS Take 1 tablet (5 mg total) by mouth 2 (two) times daily. What changed: when to take this  Indication: Thromboembolism secondary to Atrial Fibrillation   aspirin 81 MG EC tablet Take 1 tablet (81 mg total) by mouth daily. Swallow whole. Start taking on: March 24, 2021 What changed: additional instructions  Indication: Acute Heart Attack   gabapentin 100 MG capsule Commonly known as: NEURONTIN Take 1 capsule (100 mg total) by mouth 3 (three) times daily.  Indication: Abuse or Misuse of Alcohol   melatonin 5 MG Tabs Take 1 tablet (5 mg total) by mouth at bedtime.  Indication: Trouble Sleeping   metoprolol tartrate 25 MG tablet Commonly known as: LOPRESSOR Take 0.5 tablets (12.5 mg total) by mouth 2 (two) times daily. What changed:  medication strength how much to take when to take this  Indication: Atrial Fibrillation, High Blood Pressure Disorder   mirtazapine 15 MG tablet Commonly known as: REMERON Take 1 tablet (15 mg total) by mouth at bedtime.  Indication: Major Depressive Disorder        Follow-up [facility]. Go on 03/25/2021.   Why: You have a hospital follow up appointment on 03/25/21 at 11:00 am for therapy and medication management services.  This appointment will be held in person.  Please bring photo ID, proof of any income and current address. Contact  information: [address] [place], [place] [address]  Phone: [phone] Fax: [phone]        [facility]. Call.   Why: Please call this office to schedule an appointment for primary care services.  They do accept medicaid. Contact information: [address], [place] [address]  Tel: [phone]                Follow-up recommendations:  Activity:  as tolerated Diet:  Heart  healthy  Comments:  Prescriptions were given at discharge.  Patient is agreeable with the discharge plan.  He was given an opportunity to ask questions.  He appears to feel comfortable with discharge and denies any current suicidal or homicidal thoughts.   Patient is instructed prior to discharge to: Take all medications as prescribed by his mental healthcare provider. Report any adverse effects and or reactions from the medicines to his outpatient provider promptly. Patient has been instructed & cautioned: To not engage in alcohol and or illegal drug use while on prescription medicines. In the event of worsening symptoms, patient is instructed to call the crisis hotline, 911 and or go to the nearest ED for appropriate evaluation and treatment of symptoms. To follow-up with his primary care provider for your other medical issues, concerns and or health care needs.   Signed: Ethelene Hal, NP 03/23/2021, 6:03 PM

## 2021-03-23 NOTE — Discharge Summary (Addendum)
Physician Discharge Summary Note  Patient:  Alex Sandoval is an 35 y.o., male MRN:  [id number] DOB:  [birth year] Patient phone:  [phone] (home)  Patient address:   [address] [place] [address],  Total Time spent with patient: 45 minutes  Date of Admission:  03/19/2021 Date of Discharge: 03/23/2021  Reason for Admission:  Alex Sandoval is a 35 year old male with a past psychiatric history of PTSD polysubstance use in remission and depression and anxiety, presenting voluntarily with increasing depression in the context of the 1 year anniversary of his daughters murder. Per H&P: " Per chart review, patient presented to [facility] emergency department on 8/19 at 5 AM complaining of worsening depression, anxiety, and PTSD symptoms on the 1 year anniversary of his daughter's death.  Patient reports that he called the crisis line and was told to come to his nearest ED.  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  He reports being sober for the past 1 year.  Also per chart review, patient had a previous 3-day hospitalization at the behavioral health hospital primarily for substance abuse and depression.  He was prescribed Elavil during that period of time.  The charted past medical history also includes a history of a pseudoseizure malingering and intentional shoulder dislocation.   On interview with this provider patient is guarded and appears irritated. He confirms the above history and reports significant PTSD symptoms of flashbacks, nightmares, VH at nighttime, and hypervigilance. He reported never having worked with a trauma therapist, but says that "anything is worth a try at this point". He denies suicidal ideation and says that his last SI occurred 2 days ago.  He is almost pan positive for depressive symptoms, with the only intact function being his ability to concentrate when reading novels. He reports a previous medication trial with Remeron that helped  him sleep much better and he is interested in trying this medication again. He reports never having taken Lexapro previously.   The patient refuses collateral contact for this provider. "   Principal Problem: MDD (major depressive disorder), recurrent severe, without psychosis ([place]) Discharge Diagnoses: Principal Problem:   MDD (major depressive disorder), recurrent severe, without psychosis ([place]) Active Problems:   PTSD (post-traumatic stress disorder)   Past Psychiatric History: PTSD, depression, anxiety, polysubstance use in remission  Past Medical History:  Past Medical History:  Diagnosis Date   Confabulation    Coronary artery disease    Drug abuse ([place])    History of pseudoseizure    Hypertension    Malingering    Personality disorder ([place])    Post traumatic stress disorder    Seizures ([place])    Shoulder dislocation, recurrent    SELF-INDUCED IN ATTEMPT TO OBTAIN OPIATES    Past Surgical History:  Procedure Laterality Date   APPENDECTOMY     Family History: History reviewed. No pertinent family history. Family Psychiatric  History: denies Hx of schizophrenia, BPAD, suicide, or substance abuse Social History:  Social History   Substance and Sexual Activity  Alcohol Use No     Social History   Substance and Sexual Activity  Drug Use Yes   Frequency: 3.0 times per week   Comment: hx of opiate and benzo addiction/abuse    Social History   Socioeconomic History   Marital status: Single    Spouse name: Not on file   Number of children: Not on file   Years of education: Not on file   Highest education level:  Not on file  Occupational History   Not on file  Tobacco Use   Smoking status: Every Day    Packs/day: 0.50    Years: 10.00    Pack years: 5.00    Types: Cigarettes    Last attempt to quit: 01/30/2016    Years since quitting: 5.1   Smokeless tobacco: Never  Substance and Sexual Activity   Alcohol use: No   Drug use: Yes    Frequency: 3.0 times  per week    Comment: hx of opiate and benzo addiction/abuse   Sexual activity: Yes    Birth control/protection: None  Other Topics Concern   Not on file  Social History Narrative   ** Merged History Encounter **       Social Determinants of Health   Financial Resource Strain: Not on file  Food Insecurity: Not on file  Transportation Needs: Not on file  Physical Activity: Not on file  Stress: Not on file  Social Connections: Not on file    Hospital Course:   Alex Sandoval is a 35 year old male with a past psychiatric history of PTSD polysubstance use in remission and depression and anxiety, presenting voluntarily with increasing depression in the context of the 1 year anniversary of his daughters murder.  Admitting UDS negative. BAL <10.   INITIALLY, patient was affect was depressed and dysphoric.  MDD recurrent Severe,without psychotic features PTSD -Continued home Remeron 15 mg QHS - compliance encouraged             -Patient counseled on r/b/se of medication -Initially continue home prazosin 2 mg p.o., later decreased to 1 mg p.o. and discontinued given low BP  -Started Neurontin 100 mg p.o. 3 times daily to help with anxiety and for reported neuropathic pain -Supportive psychotherapy -Would benefit from trauma focused therapy after discharge -Continue Ensure                MEDICAL MANAGEMENT: Covid negative CMP:  unremarkable CBC: WBC 3.9 with ANC 2500; H/H 12.9/38.9 and platelets 279 (see below) EtOH: <10 UDS: neg TSH: 10.4 (see below) A1C: 5.6 Lipids: WNL Mag 2.4   Afib Self reported Hx of Afib EKG NSR with Qtc 393 (8/19) Medicine consulted, recommendations as below: -Continue Eliquis 5 mg BID -Initially continued home metoprolol 25 mg p.o. twice daily, later decrease to metoprolol 12.5 mg BID due to low BP, we will continue to monitor blood pressure -Continue home ASA 81 mg -Recommend starting Kcl 20 meq daily and Mg ox 400 mg daily to keep K>4, Mg>2.  But patient refusing Mag or K supplementation at this time   Leukopenia WBCs 4.1 on admission -> 3.7 (8/21) ANC stable   Elevated TSH Patient reports previous treatment with Synthroid. Per medicine consult, unsure if true hypothyroid vs. Euthyroid sick syndrome TSH of 10.4 (8/20), 18.0 (8/21).  Free T4 0.73 (8/21) Free T3 in process  - Repeat TSH in 4 weeks and no indication to start synthroid at this time per hospitalist recommendations  Patient reported histories that were inconsistent.  Throughout hospitalization, patient became agitated about medication changes due to his blood pressure.  Started to refuse vital signs and lab work.  Also displayed splitting personality to various staff members.  Requested medicine consult for assistance in med management in regards to his reported history of A. fib, elevated TSH, low blood pressure. Patient did not display any dangerous, violent or suicidal behavior on the unit.   The medication regimen targeting those presenting symptoms were  discussed with patient & initiated with patient's consent. Besides medical management, patient was also enrolled & participated in the group counseling sessions being offered & held on this unit. Patient learned coping skills. Patient presented no other significant pre-existing medical issues that required treatment.  Patient tolerated this treatment regimen without any adverse effects or reactions reported.   During the course of patient's hospitalization, the 15-minute checks were adequate to ensure patient's safety.  Patient interacted with patients & staff appropriately, participated appropriately in the group sessions/therapies. Patient's medications were addressed & adjusted to meet his/her needs. Patient was recommended for outpatient follow-up care & medication management upon discharge to assure continuity of care & mood stability.    At the time of discharge patient is not reporting any acute  suicidal/homicidal ideations. Patient feels more confident about his/her self-care & in managing his mental health. Patient currently denies any new issues or concerns.  Education and supportive counseling provided throughout his/her hospital stay & upon discharge.   Today upon discharge evaluation. Patient is doing well and denies any other specific concerns. Patient is sleeping well and appetite is good. Patient denies other physical complaints. Patient denies AH/VH, delusional thoughts or paranoia, and does not appear to be responding to any internal stimuli. Patient feels that their medications have been helpful & is in agreement to continue this current treatment regimen as recommended. Patient was able to engage in safety planning including plan to return to [facility] or contact emergency services if patient feels unable to maintain their own safety or the safety of others. Patient had no further questions, comments, or concerns. Patient left [facility] with all personal belongings in no apparent distress.  Transportation per [employer]*.    Physical Findings:  Musculoskeletal: Strength & Muscle Tone: within normal limits Gait & Station: normal, steady Patient leans: N/A   Psychiatric Specialty Exam:  Presentation  General Appearance: Appropriate for Environment; Casual  Eye Contact:Good  Speech:Clear and Coherent; Normal Rate  Speech Volume:Normal  Handedness:Right   Mood and Affect  Mood:Euthymic  Affect:Appropriate; Congruent   Thought Process  Thought Processes:Coherent; Linear  Descriptions of Associations:Intact  Orientation:Full (Time, Place and Person)  Thought Content:Logical  History of Schizophrenia/Schizoaffective disorder:No  Duration of Psychotic Symptoms:No data recorded Hallucinations:Hallucinations: None  Ideas of Reference:None  Suicidal Thoughts:Suicidal Thoughts: No  Homicidal Thoughts:Homicidal Thoughts: No   Sensorium  Memory:Immediate  Good; Recent Good; Remote Good  Judgment:Fair  Insight:Shallow   Executive Functions  Concentration:Good  Attention Span:Good  [place] of Knowledge:Good  Language:Good   Psychomotor Activity  Psychomotor Activity:Psychomotor Activity: Normal   Assets  Assets:Communication Skills; Desire for Improvement; Housing; Leisure Time; Physical Health; Resilience; Social Support   Sleep  Sleep:Sleep: Good Number of Hours of Sleep: 6.5    Physical Exam: Physical Exam Vitals and nursing note reviewed. Exam conducted with a chaperone present.  Constitutional:      Appearance: Normal appearance. He is normal weight.  HENT:     Head: Normocephalic and atraumatic.     Nose: Nose normal.  Eyes:     Extraocular Movements: Extraocular movements intact.  Cardiovascular:     Rate and Rhythm: Normal rate.  Pulmonary:     Effort: Pulmonary effort is normal. No respiratory distress.  Musculoskeletal:        General: Normal range of motion.     Cervical back: Normal range of motion.  Neurological:     General: No focal deficit present.     Mental Status: He is alert  and oriented to person, place, and time.   Review of Systems  Constitutional: Negative.   Respiratory: Negative.    Cardiovascular: Negative.   Gastrointestinal: Negative.   Musculoskeletal: Negative.   Neurological: Negative.   Psychiatric/Behavioral:  Negative for depression, hallucinations, memory loss, substance abuse and suicidal ideas. The patient has insomnia. The patient is not nervous/anxious.   Blood pressure 100/79, pulse 77, temperature 98.1 F (36.7 C), temperature source Oral, resp. rate 18, height 6' (1.829 m), weight 104.3 kg, SpO2 98 %. Body mass index is 31.19 kg/m.   Social History   Tobacco Use  Smoking Status Every Day   Packs/day: 0.50   Years: 10.00   Pack years: 5.00   Types: Cigarettes   Last attempt to quit: 01/30/2016   Years since quitting: 5.1  Smokeless Tobacco  Never   Tobacco Cessation:  N/A, patient does not currently use tobacco products   Blood Alcohol level:  Lab Results  Component Value Date   [facility] <10 03/19/2021   ETH <11 [id number]    Metabolic Disorder Labs:  Lab Results  Component Value Date   HGBA1C 5.6 03/20/2021   MPG 114.02 03/20/2021   No results found for: PROLACTIN Lab Results  Component Value Date   CHOL 152 03/20/2021   TRIG 97 03/20/2021   HDL 44 03/20/2021   CHOLHDL 3.5 03/20/2021   VLDL 19 03/20/2021   LDLCALC 89 03/20/2021    See Psychiatric Specialty Exam and Suicide Risk Assessment completed by Attending Physician prior to discharge.  Discharge destination:  Home  Is patient on multiple antipsychotic therapies at discharge:  No   Has Patient had three or more failed trials of antipsychotic monotherapy by history:  No  Recommended Plan for Multiple Antipsychotic Therapies: NA  Discharge Instructions     Diet - low sodium heart healthy   Complete by: As directed    Increase activity slowly   Complete by: As directed       Allergies as of 03/23/2021       Reactions   Benadryl [diphenhydramine Hcl] Anaphylaxis   Penicillins Anaphylaxis   Has patient had a PCN reaction causing immediate rash, facial/tongue/throat swelling, SOB or lightheadedness with hypotension: Yes Has patient had a PCN reaction causing severe rash involving mucus membranes or skin necrosis: No Has patient had a PCN reaction that required hospitalization No Has patient had a PCN reaction occurring within the last 10 years: Yes If all of the above answers are "NO", then may proceed with Cephalosporin use.   Ketamine Hypertension        Medication List     TAKE these medications      Indication  apixaban 5 MG Tabs tablet Commonly known as: ELIQUIS Take 1 tablet (5 mg total) by mouth 2 (two) times daily. What changed: when to take this  Indication: Thromboembolism secondary to Atrial Fibrillation   aspirin 81 MG EC  tablet Take 1 tablet (81 mg total) by mouth daily. Swallow whole. Start taking on: March 24, 2021 What changed: additional instructions  Indication: Acute Heart Attack   gabapentin 100 MG capsule Commonly known as: NEURONTIN Take 1 capsule (100 mg total) by mouth 3 (three) times daily.  Indication: Abuse or Misuse of Alcohol   melatonin 5 MG Tabs Take 1 tablet (5 mg total) by mouth at bedtime.  Indication: Trouble Sleeping   metoprolol tartrate 25 MG tablet Commonly known as: LOPRESSOR Take 0.5 tablets (12.5 mg total) by mouth 2 (two) times daily. What  changed:  medication strength how much to take when to take this  Indication: Atrial Fibrillation, High Blood Pressure Disorder   mirtazapine 15 MG tablet Commonly known as: REMERON Take 1 tablet (15 mg total) by mouth at bedtime.  Indication: Major Depressive Disorder        Follow-up [place] Go on 03/25/2021.   Why: You have a hospital follow up appointment on 03/25/21 at 11:00 am for therapy and medication management services.  This appointment will be held in person.  Please bring photo ID, proof of any income and current address. Contact information: [address] [place], [place] [address]  Phone: [phone] Fax: [phone]        [facility] Call.   Why: Please call this office to schedule an appointment for primary care services.  They do accept medicaid. Contact information: [address] [place],  [address]  Tel: [phone]                Follow-up recommendations:   - Activity as tolerated. - Diet as recommended by PCP. - Keep all scheduled follow-up appointments as recommended.  Patient is instructed to take all prescribed medications as recommended. Report any side effects or adverse reactions to your outpatient psychiatrist. Patient is instructed to abstain from alcohol and illegal drugs while on  prescription medications. In the event of worsening symptoms, patient is instructed to call the crisis hotline, 911, or go to the nearest emergency department for evaluation and treatment.  Comments:   Prescriptions given at discharge. Patient agreeable to plan. Given opportunity to ask questions. Appears to feel comfortable with discharge denies any current suicidal or homicidal thought.  Patient is also instructed prior to discharge to: Take all medications as prescribed by mental healthcare provider. Report any adverse effects and or reactions from the medicines to outpatient provider promptly. Patient has been instructed & cautioned: To not engage in alcohol and or illegal drug use while on prescription medicines. In the event of worsening symptoms,  patient is instructed to call the crisis hotline, 911 and or go to the nearest ED for appropriate evaluation and treatment of symptoms. To follow-up with primary care provider for other medical issues, concerns and or health care needs  The patient was evaluated each day by a clinical provider to ascertain response to treatment. Improvement was noted by the patient's report of decreasing symptoms, improved sleep and appetite, affect, medication tolerance, behavior, and participation in unit programming.  Patient was asked each day to complete a self inventory noting mood, mental status, pain, new symptoms, anxiety and concerns.  Patient responded well to medication and being in a therapeutic and supportive environment. Positive and appropriate behavior was noted and the patient was motivated for recovery. The patient worked closely with the treatment team and case manager to develop a discharge plan with appropriate goals. Coping skills, problem solving as well as relaxation therapies were also part of the unit programming.  By the day of discharge patient was in much improved condition than upon admission.  Symptoms were reported as significantly  decreased or resolved completely. The patient denied SI/HI and voiced no AVH. The patient was motivated to continue taking medication with a goal of continued improvement in mental health.   Patient was discharged home with a plan to follow up as noted.   Signed: Merrily Brittle, DO, PGY-1  I have reviewed the note by  Dr. Alfonse Spruce, and agree with discharge summary.  I have independently assessed patient and completed a separate suicide risk assessment for discharge.    Lavella Hammock, MD   Merrily Brittle, DO, PGY-1   03/23/2021, 8:10 PM

## 2021-03-23 NOTE — [facility] Suicide Risk Assessment (Signed)
[place] INPATIENT:  Family/Significant Other Suicide Prevention Education  Suicide Prevention Education:  Education Completed; Ralph Hermoso [phone] [facility]) has been identified by the patient as the family member/significant other with whom the patient will be residing, and identified as the person(s) who will aid the patient in the event of a mental health crisis (suicidal ideations/suicide attempt).  With written consent from the patient, the family member/significant other has been provided the following suicide prevention education, prior to the and/or following the discharge of the patient.  The suicide prevention education provided includes the following: Suicide risk factors Suicide prevention and interventions National Suicide Hotline telephone number [facility] assessment telephone number [facility] Emergency Assistance [place] and/or Residential Mobile Crisis Unit telephone number  Request made of family/significant other to: Remove weapons (e.g., guns, rifles, knives), all items previously/currently identified as safety concern.   Remove drugs/medications (over-the-counter, prescriptions, illicit drugs), all items previously/currently identified as a safety concern.  The family member/significant other verbalizes understanding of the suicide prevention education information provided.  The family member/significant other agrees to remove the items of safety concern listed above.  CSW spoke with Mrs. Tworek who states that her nephew has been in and out of jail and "moves around a lot".  She state that there are weeks and months where the family does not hear from him and is not sure where he is.  She states that his father passed away in 2020-09-09 and she believes that this has been heavy on his mind. Mrs. Westgate states that her nephew has never been in [employer] but tells everyone that he has been.  She states that he often tells people things  that are not true or did not happen and states that his mother was the same way.  Mrs. Gunsch states that she is not financially able to take her nephew into her home.  She states that she is not aware of any pre-existing diagnoses, substance use, or weapons and firearms that would be in his possession.  CSW completed SPE with Mrs. Huard.   Darleen Crocker 03/23/2021, 12:55 PM

## 2021-03-23 NOTE — Plan of Care (Signed)
Nurse discussed coping skills with patient.  
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
## 2021-03-23 NOTE — BHH Counselor (Signed)
CSW contacted [employer] for the Pt.  A form had already been filled out to take the Pt more then 75 miles to his Aunt's home.  CSW contacted the transportation at 1:35pm.

## 2021-03-23 NOTE — Progress Notes (Signed)
Discharge Note:  Patient discharged via uber to go to his aunt's home.  Suicide prevention information given and discussed with patient who stated he understood and had no questions.  Patient denied SI and HI.  Denied A/V hallucinations.  Patient stated he received all his belongings, clothing, toiletries, misc items, etc.  Patient stated he appreciated all assistance received from [facility] staff.  All required discharge information given.

## 2023-06-14 IMAGING — DX DG SHOULDER 2+V*L*
3 series · 3 of 3 positions shown · non-contrast
Comparison: None.

CLINICAL DATA: Dislocation.

EXAM:
LEFT SHOULDER - 2+ VIEW

[shoulder ap]
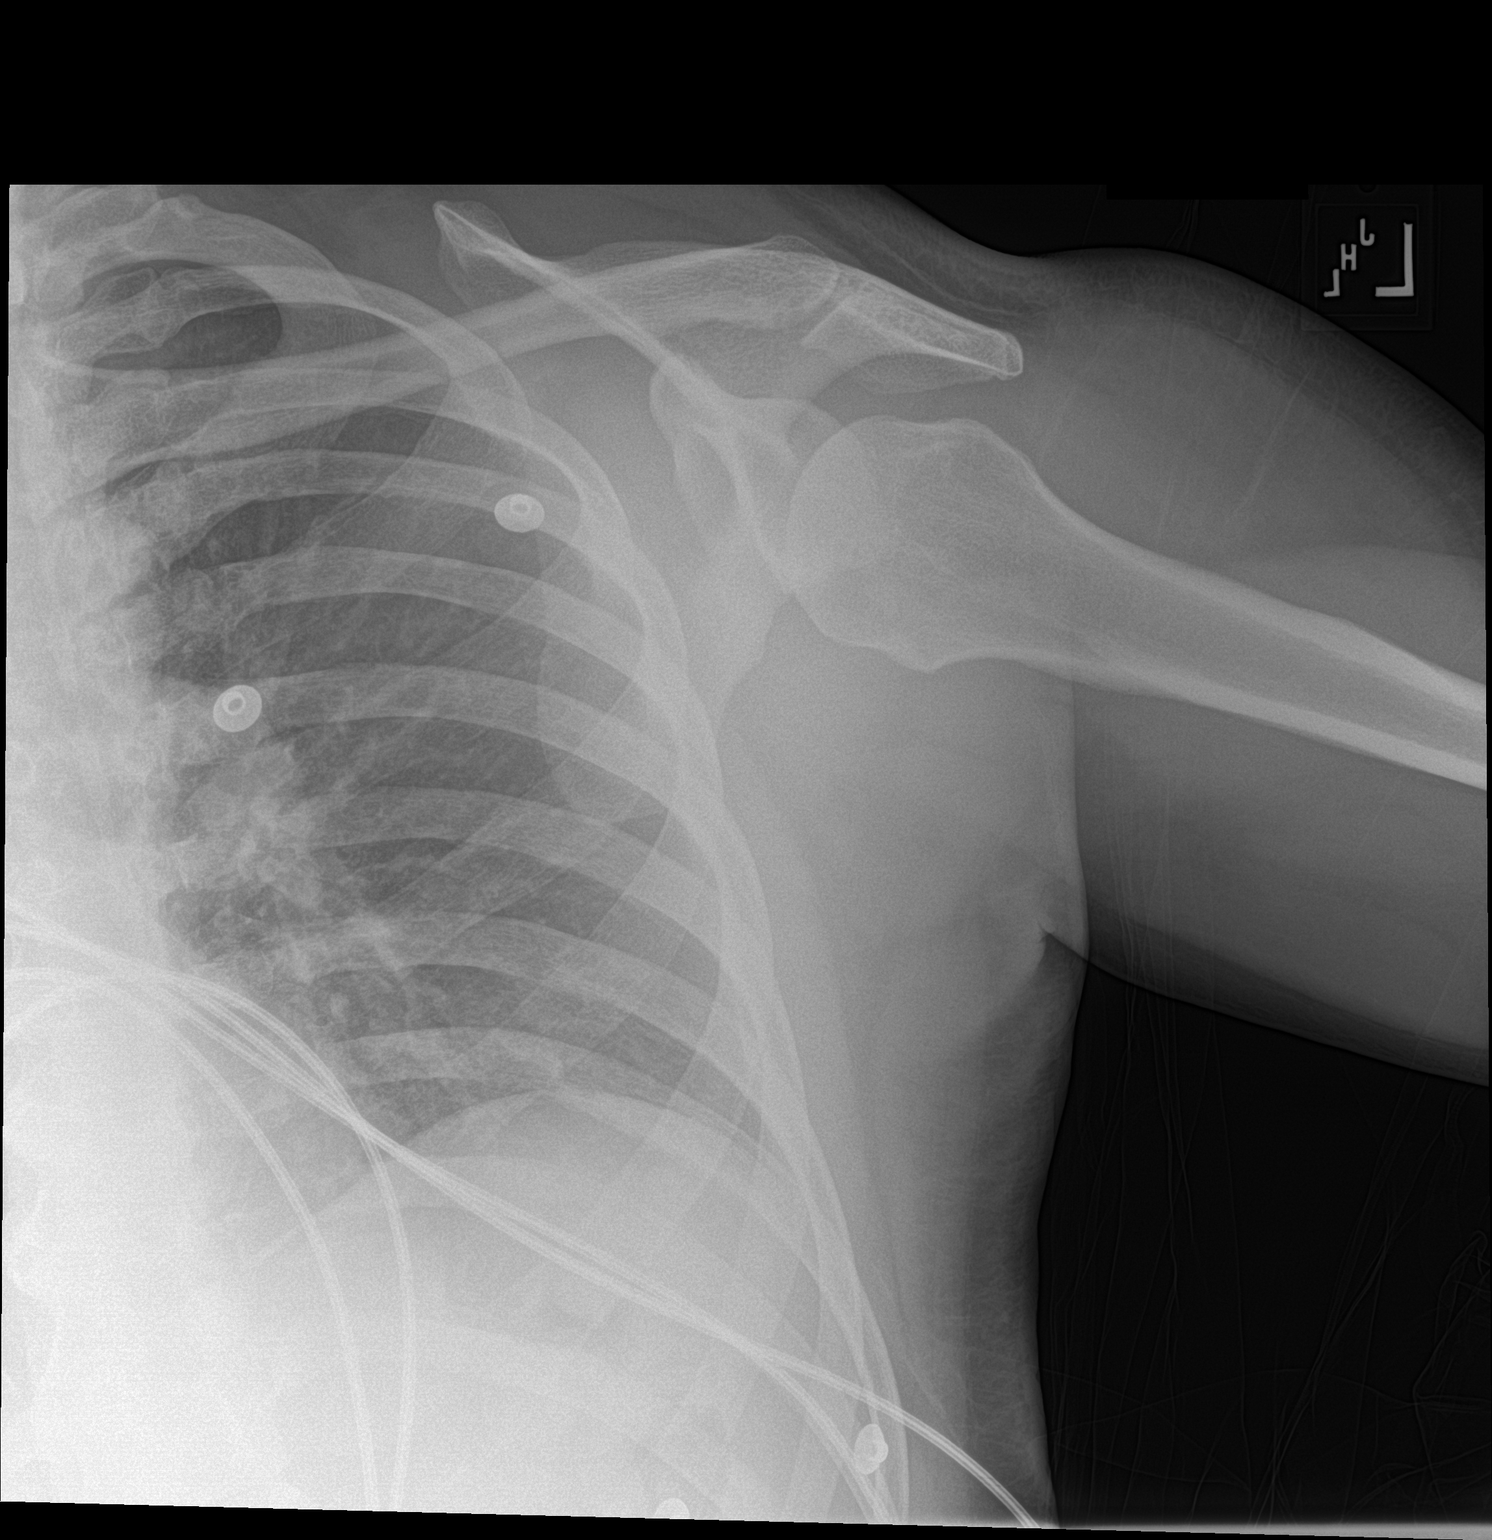

[shoulder obl (1 of 2)]
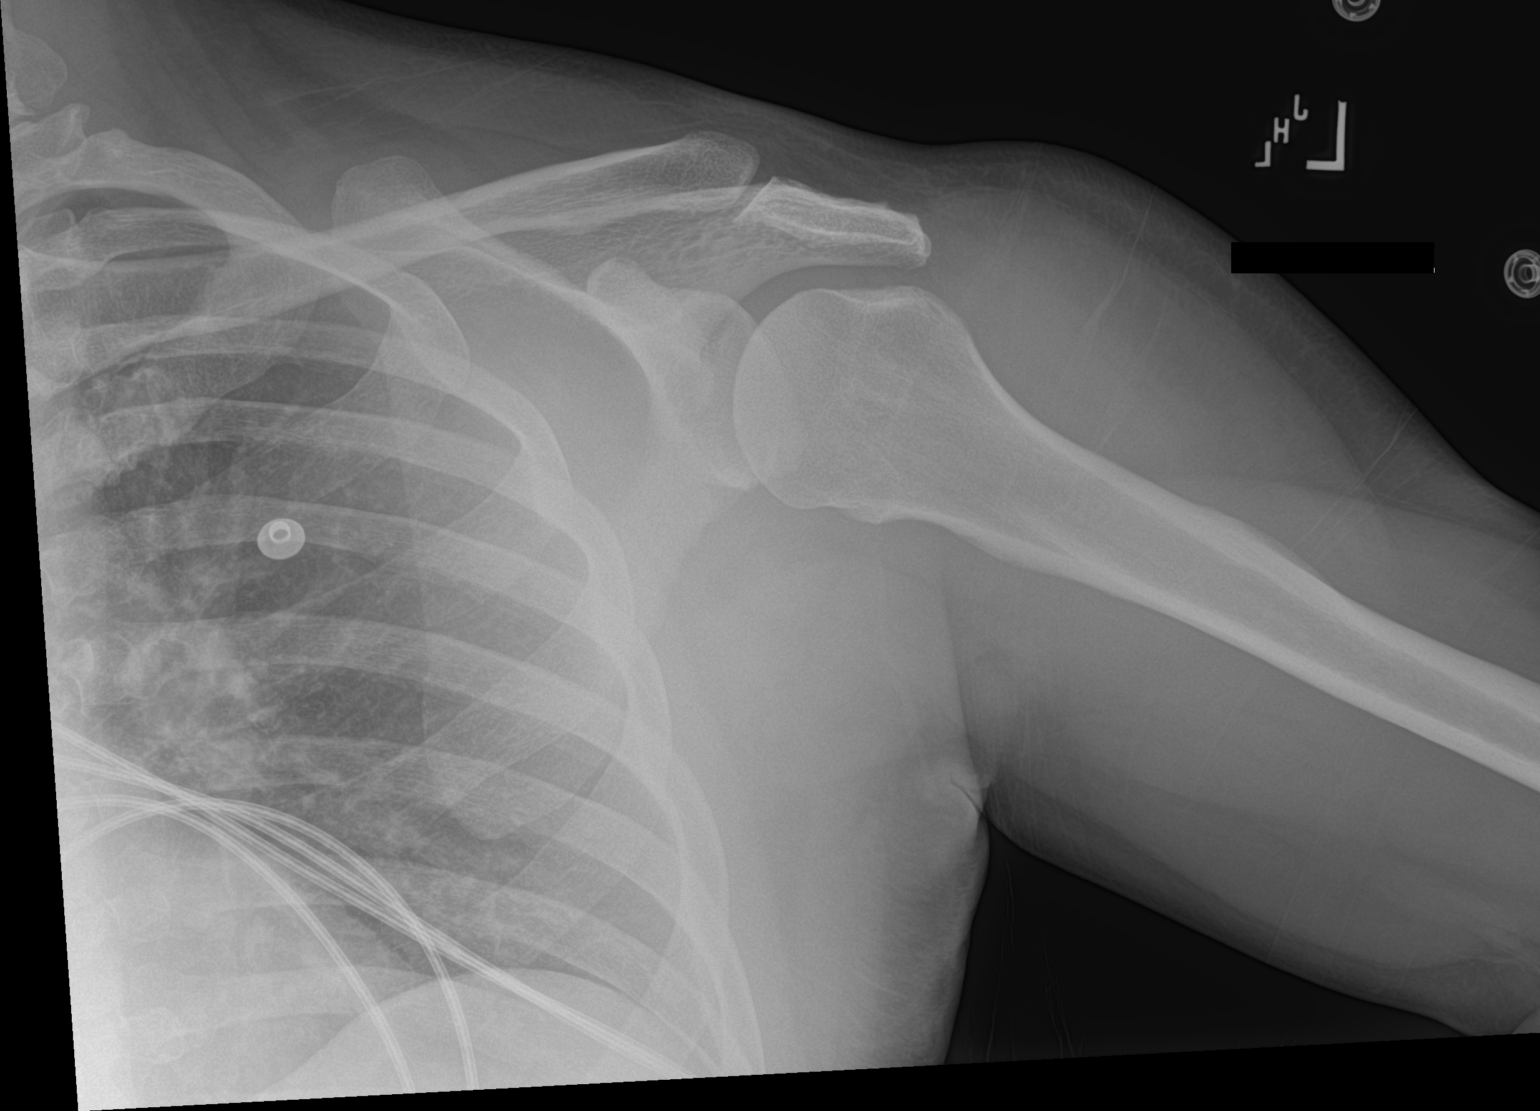

[shoulder obl (2 of 2)]
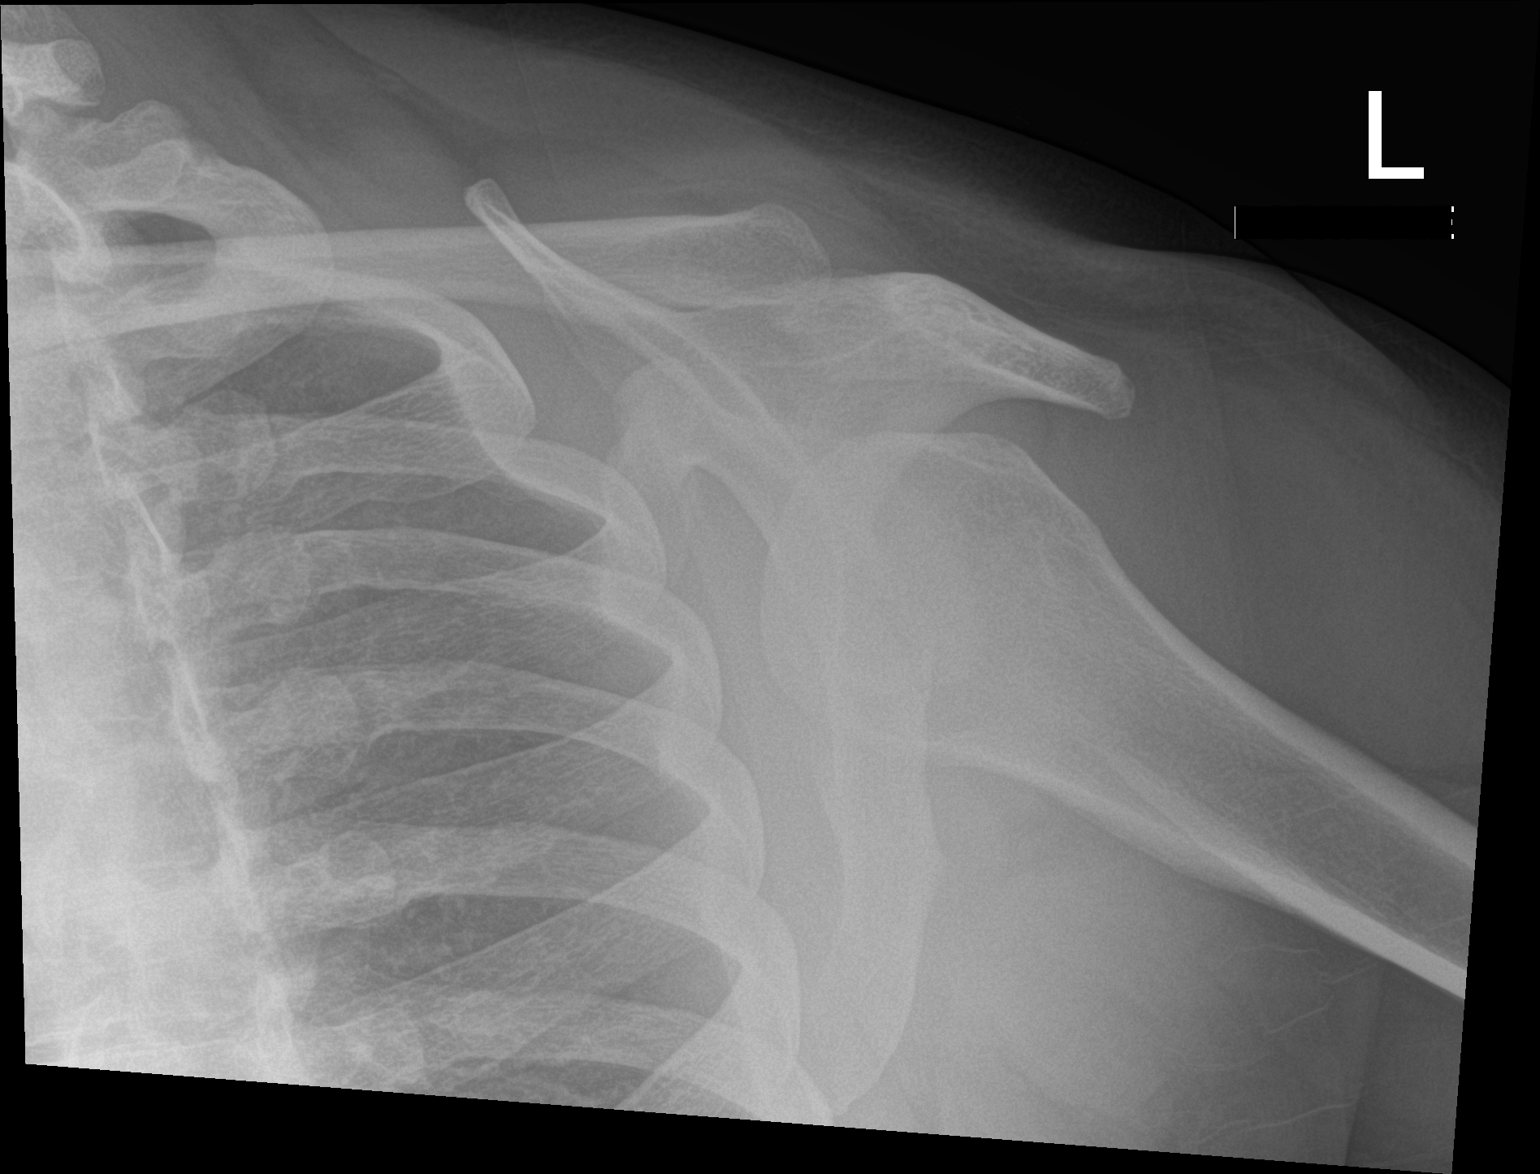

[3 of 3 positions shown; findings below may reference images not displayed]

FINDINGS: Study limited by patient positioning. Within this limitation, no
gross shoulder dislocation evident. No findings of acute fracture.
No worrisome lytic or sclerotic osseous abnormality.
IMPRESSION: Limited study due to patient positioning. Within this limitation, no
gross shoulder dislocation evident.

## 2024-05-31 ENCOUNTER — Inpatient Hospital Stay
Admit: 2024-05-31 | Discharge: 2024-06-01 | Discharge status: Against Medical Advice | Payer: MEDICAID | Arrived: WI | Attending: Emergency Medicine

## 2024-05-31 ENCOUNTER — Emergency Department: Admit: 2024-05-31 | Payer: MEDICAID

## 2024-05-31 ENCOUNTER — Emergency Department: Payer: MEDICAID

## 2024-05-31 DIAGNOSIS — S43004A Unspecified dislocation of right shoulder joint, initial encounter: Principal | ICD-10-CM

## 2024-05-31 MED ORDER — PROPOFOL BOLUS 10 MG/ML SOLN (WRAPPER)
10 | Freq: Once | INTRAVENOUS | Status: DC
Start: 2024-05-31 — End: 2024-05-31

## 2024-05-31 MED ORDER — KETOROLAC TROMETHAMINE 30 MG/ML IJ SOLN
30 | INTRAMUSCULAR | Status: DC
Start: 2024-05-31 — End: 2024-05-31

## 2024-05-31 MED ORDER — ONDANSETRON HCL 4 MG/2ML IJ SOLN
4 | Freq: Once | INTRAMUSCULAR | Status: DC
Start: 2024-05-31 — End: 2024-05-31

## 2024-05-31 MED ORDER — SODIUM CHLORIDE 0.9 % IV BOLUS
0.9 | Freq: Once | INTRAVENOUS | Status: DC
Start: 2024-05-31 — End: 2024-05-31

## 2024-05-31 MED FILL — KETOROLAC TROMETHAMINE 30 MG/ML IJ SOLN: 30 mg/mL | INTRAMUSCULAR | Qty: 1 | Fill #0

## 2024-05-31 MED FILL — SODIUM CHLORIDE 0.9 % IV SOLN: 0.9 % | INTRAVENOUS | Qty: 1000 | Fill #0

## 2024-05-31 MED FILL — ONDANSETRON HCL 4 MG/2ML IJ SOLN: 4 MG/2ML | INTRAMUSCULAR | Qty: 2 | Fill #0

## 2024-05-31 NOTE — ED Notes (Signed)
"  Patient signed ama form.   Patient lefted in a c-collar and right arm sling and was informed to not take it off until he is seen by MD at [facility]   "
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
## 2024-05-31 NOTE — ED Notes (Signed)
"  Doctor informed patient that he would be getting an ct scan and patient said  he wanted to leave and go to [facility] ( [facility] ).   Patient was informed of the medical condition that can happen if he signed out ama.   Patient verbalized understanding and stated he was calling his sister to take him to [facility]   "
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
## 2024-05-31 NOTE — ED Notes (Addendum)
 IV access attempted X2 unsuccessful

## 2024-06-12 NOTE — H&P (Signed)
 "TRAUMA SURGERY HISTORY AND PHYSICAL   __________________________________  Dr Sammi PARAS. Trenda - Attending Attestation: I personally saw and evaluated the patient on the date of admission at 17:39.     I repeated the critical/key portions of the service and discussed with the resident.  I have reviewed the resident's documentation and edited as appropriate.      On the date of the encounter, I spent 31 minutes in patient care with > 50% in counseling/ coordination of care as described in the note including initial response to trauma team alert, critical care assessment and management per ATLS primary and secondary survey protocol for polytrauma patient with high energy mechanism and high risks of injuries, with noted back pain.     I reviewed and interpreted patient's  chest xray, as well as CT scan of head to r/o any traumatic brain injury, neck CTA to r/o blunt cerebrovascular injury for which the patient is at risk, chest CT to evaluate for any aortic injury as well as other thoracic and pulmonary trauma (Ribs fx or pulmonary contusions) , abdominal and pelvic CT to r/o internal solid or visceral organ injuries, and cervical/thoracic/lumbar CT to r/o any spinal trauma.       Sammi PARAS Trenda MD FACS   Professor of Surgery   Division of Acute Care Surgical Services   _____________________________________               Date/Time: 06/12/2024, 5:40 PM     Patient Name: Evan Woods   MRN: [id number]   DOB: 15-Nov-1985     Attending Physician: Dr. Trenda     Primary Care Physician: None Pcp     Date of Admission:   06/12/2024  5:23 PM     Assessment/Plan:   Assessment   38 yo M ECHO TTA s/p 20 foot fall on blood thinners at risk for occult injuries and bleeding  -CT scan head, neck, chest, abdomen, pelvis  -Labs ordered- CBC, BMP, Mg, Phos, Lactate, UDS  -Analgesia prn- fentanyl /acetaminophen  -C collar placed- maintain C spine until able to clear safely  -Maintain logroll until able to clear safely with imaging  -Two large  bore IVs  -Cardiac Monitor  -Tertiary survey pending  -Dispo to follow for consultation and final placement  -Orthopedic surgery consult to evaluate for possible L shoulder dislocation    Problem List:  Principal Problem:    Fall from height of greater than 3 feet       Chief Complaint:  Fall from 20 feet    History of Present Illness:  Evan Woods is a 38 y.o. male who presents to the hospital as an ECHO TTA after a fall from 20 feet. Per report, he was working on a ladder at the height of a roof when he fell onto his left side. Landed on grass. Denies LOC, but endorses head strike. He is on Eliquis  for a history of atrial fibrillation. Per report, he was ambulatory after the incident and presented to a local fire station for assessment. They present via EMS with a cervical collar in place on a backboard.     The incident occurred at approximately 16:00 today 11/12.    On primary survey patient was found to have an intact airway, they had clear bilateral breath sounds and strong femoral/radial pulses bilaterally. Patient had R sided chest wall tenderness without crepitus, they had tenderness to the upper abdomen. Patient denied pelvic pain and had a stable pelvis. Patient was complaining  of L shoulder pain. Patient had palpable pulses in the feet dp/pt and motor was intact in all four extremities. There was decreased sensation to light touch in left lower extremity, however sensation was intact in all other extremities. There was a deformity to the L shoulder with significant pain with movement. There was bruising to bilateral thighs and lumbar spine tenderness to palpation. There were no lacerations and abrasions. Ultrasound FAST exam was negative.     Allergies:   Allergies[1]   Ketamine  Penicillins    Medications:   Eliquis   Atorvastatin   Buspar   Lisinopril   Seroquel     I have reviewed the patient's medications.    Current Medications[2]    Past Medical History:   Medical History[3]   Atrial  fibrillation  TIA  Depression  HTN  HLD  Lymphoma, currently in remission    Past Surgical History:   Surgical History[4]   Hernia repair  Appendectomy    Family History:   Family History[5]   Denies family history of bleeding disorders    Social History:   Social History     Socioeconomic History    Marital status: Divorced   Tobacco Use    Smoking status: Never     Social Drivers of Psychologist, [employer] Strain: Medium Risk (09/09/2023)    Received from [facility]    Overall Financial Resource Strain (CARDIA)     Difficulty of Paying Living Expenses: Somewhat hard   Food Insecurity: High Risk (02/12/2024)    Received from [facility]    Food Insecurity     Within the past 12 months, did the food you bought just not last and you didn't have money to get more?: Yes     Within the past 12 months, did you worry that your food would run out before you got money to buy more?: No   Transportation Needs: Low Risk (02/12/2024)    Received from [facility]    Transportation Needs     Within the past 12 months, has a lack of transportation kept you from medical appointments or from doing things needed for daily living?: No   Social Connections: Low Risk  (08/18/2022)    Received from [employer] ([place], [place], [place], [place], [place])    Family and [employer]     Help with Day to Day Activities: Not on file     Feeling Lonely or Isolated: Not on file   Intimate Partner Violence: Not At Risk (12/05/2023)    Received from [facility]    HITS     Over the last 12 months how often did your partner physically hurt you?: Never     Over the last 12 months how often did your partner insult you or talk down to you?: Never     Over the last 12 months how often did your partner threaten you with physical harm?: Never     Over the last 12 months how often did your partner scream or curse at you?: Never   Housing Stability: Low Risk (02/12/2024)    Received from [facility]    Housing Stability     Within the past 12 months, have you ever stayed: outside, in a car, in a tent, in an overnight shelter, or temporarily in someone else's home (i.e. couch-surfing)?: No     Are you worried about losing your housing? : No  Denies smoking, alcohol, or drug use    Tetanus:   Td vaccination indicated and given today    Review of Systems:   Review of Systems   Constitutional:  Negative for chills and fever.   HENT:  Positive for congestion. Negative for facial swelling, hearing loss and trouble swallowing.    Eyes: Negative.    Respiratory:  Positive for cough. Negative for chest tightness, shortness of breath and stridor.    Cardiovascular:  Positive for chest pain. Negative for palpitations.   Gastrointestinal:  Positive for abdominal pain. Negative for abdominal distention, nausea and vomiting.   Genitourinary: Negative.    Musculoskeletal:  Positive for back pain. Negative for joint swelling and neck pain.   Skin:  Negative for pallor and wound.   Neurological:  Negative for dizziness, syncope, weakness, light-headedness and numbness.   Psychiatric/Behavioral:  Negative for agitation.      Physical Exam:   Visit Vitals  BP 128/74   Pulse 78   Temp 36.8 C (98.3 F) (Oral)   Resp 17   Wt 103.5 kg   SpO2 97%   BMI 33.70 kg/m   Smoking Status Never   BSA 2.24 m     Last Recorded Vitals:  Blood pressure 128/74, pulse 78, temperature 36.8 C (98.3 F), temperature source Oral, resp. rate 17, weight 103.5 kg, SpO2 97%.  Range of Vitals:  Temp:  [36.7 C (98 F)-36.8 C (98.3 F)] 36.8 C (98.3 F)  Heart Rate:  [70-103] 78  Resp:  [8-22] 17  BP: (105-153)/(63-125) 128/74    Physical Exam:  GENERAL: Airway patent, lying on stretcher, C collar in place  HEENT: Head atraumatic, normocephalic, EOMI  NECK: No JVD, trachea midline, no c-spine tenderness  CHEST: R sided chest wall tenderness, no crepitus  CV: Regular rate and rhythm, normotensive  RESP: Breath sounds equal bilaterally,  Sats in upper 90's on RA  ABD: Abdomen soft, tender to palpation in upper quadrants, nondistended, pelvis stable, 2+ femoral pulses  GU: No blood in perineum   BACK: No injuries to the back, lumbar spinal tenderness, no step-offs  EXTREMITIES: L shoulder deformity and tenderness to palpation, radial pulses 2+ bilaterally, Pedal pulses 2+ bilaterally   NEURO: GCS 15, A&Ox4, slightly decreased sensation in left lower extremity, bilateral upper and lower motor and sensory grossly intact otherwise    Labs:   Labs Reviewed   RESPIRATORY PATHOGEN PCR TESTING - Abnormal       Result Value    Adenovirus Not Detected      SARS-CoV-2 Not Detected      Influenza B Not Detected      Human Metapneumovirus Not Detected      Respiratory Syncytial Virus Not Detected      Rhinovirus/Enterovirus Not Detected      Bordetella pertussis Not Detected      Chlamydophila pneumoniae Not Detected      Mycoplasma pneumoniae Not Detected      Coronavirus (other than COVID-19) Not Detected      Influenza A Not Detected      Parainfluenza Detected (*)     Bordetella parapertussis Not Detected      Narrative:     The FDA has authorized this PCR-based testing. The [facility] laboratory has verified the method's performance characteristics as claimed by manufacturers' instructions for use, as well as the performance characteristics of the [facility] laboratory-developed test. Fact sheets for patients and providers are available on the [facility] Catalog located at [email or web address]://[email or web address].[facility]/#!/home.  CBC - Abnormal    WBC 4.8      RBC 4.10 (*)     HGB 13.0 (*)     HCT 38.8 (*)     MCV 94.6 (*)     MCH 31.7      MCHC 33.5      RDW 13.6      Platelets 182      MPV 9.9      % NRBC 0.0     BASIC METABOLIC PANEL - Abnormal    Sodium 137      Potassium 3.7      Potassium Comment Not Hemolyzed      Chloride 113 (*)     Carbon Dioxide 20 (*)     Anion Gap 4      Glucose 94      BUN 9      Creatinine 0.95      GFRcr (Estimated) 104      Calcium  7.8  (*)    DRUGS OF ABUSE SCREEN, URINE, STAT - Abnormal    Amphetamine Screen, Urine Negative      Barbiturate Screen, Urine Negative      Benzodiazepine Screen, Urine Negative      Cannabinoid Screen, Urine Negative      Cocaine Screen, Urine Negative      Fentanyl  Screen, Urine Presumptive Positive (*)     Opiate Screen, Urine Negative      PCP Screen, Urine Negative      Oxycodone  Screen, Urine Negative      Creatinine, Urine 202.2      Narrative:     Drug Screen results are for screening and treatment purposes only. Drug screens have limitations which may result in false-negative or false-positive results. These erroneous results may be due to an interfering substance (i.e. other drugs or metabolites) or a lack of sensitivity to certain members of a particular drug class. Confirmation of unexpected screening results using a definitive method (ex: mass spectrometry) is recommended. Unconfirmed screening results must not be used for non-medical purposes.   CBC WITH AUTO DIFFERENTIAL - Abnormal    WBC 3.1 (*)     RBC 3.75 (*)     HGB 12.0 (*)     HCT 35.4 (*)     MCV 94.4 (*)     MCH 32.0      MCHC 33.9      RDW 13.4      Platelets 173 (*)     MPV 9.7      % NRBC 0.0      % Neu 47.1      % Lym 34.2      % Mono 12.9      % Eos 5.2      % Baso 0.6      Neutrophils Absolute 1.5 (*)     LYM 1.1      MONO 0.4      EOS 0.2      BASO <0.1      Morph/Comment        Value: Slight anisocytosis.,Moderate burr cells.,Results confirmed by smear review.    Narrative:     This is an appended report.  These results have been appended to a previously verified report.   BASIC METABOLIC PANEL - Abnormal    Sodium 136      Potassium 3.1 (*)     Potassium Comment Not Hemolyzed      Chloride 110      Carbon  Dioxide 23      Anion Gap 3      Glucose 103 (*)     BUN 10      Creatinine 0.98      GFRcr (Estimated) 101      Calcium  7.5 (*)    HEPATIC FUNCTION PANEL - Abnormal    AST 27      ALT 31      Alk Phos 81      Bilirubin, Total 0.4       Bilirubin, Conjugated 0.2      Protein, Total 6.1 (*)     Albumin 3.6 (*)     Globulin 2.5     LACTATE, PLASMA - Normal    Lactate 1.1     DRUG SCREEN, SERUM STAT - Normal    Acetaminophen <3.0      Barbiturate Screen Negative      Benzodiazepine Screen Negative      Cocaine Screen Negative      Opiate Screen Negative      Salicylate  <5      Ethanol Level <10      Narrative:     Drug Screen results are for screening and treatment purposes only. Drug screens have limitations which may result in false-negative or false-positive results. These erroneous results may be due to an interfering substance (i.e. other drugs or metabolites) or a lack of sensitivity to certain members of a particular drug class. Confirmation of unexpected screening results using a definitive method (ex: mass spectrometry) is recommended. Unconfirmed screening results must not be used for non-medical purposes.                                     This test was developed and its performance characteristics determined by [facility]. It has not been cleared or approved by the [place]  Food and Drug Administration. Test methodology and performance characteristics are available upon request. [facility] are certified under the Clinical Laboratory Improvement Amendments (CLIA) as qualified to perform high complexity clinical laboratory testing. The test is used for clinical purposes and should not be regarded as investigational or for research.                     MAGNESIUM  - Normal    Magnesium  1.8     TROPONIN I - Normal    Troponin I <0.03     MAGNESIUM  - Normal    Magnesium  1.9     PHOSPHORUS - Normal    Phosphorus 3.1     APTT    AntiCoag Therapy No Anticoagulant      APTT 26      Narrative:     The range established by [facility] for heparin levels (anti-Xa method) of 0.3-0.7 IU/mL corresponds with APTT results of 70-110 seconds.   PROTIME-INR    AntiCoag Therapy No Anticoagulant      PT 13.2      INR 1.0      Narrative:      The INR is for use only in the monitoring of patients on stabilized warfarin therapy.                   Therapeutic Ranges (American Heart Association):                   2.5-3.5: Mechanical prosthetic heart valves.  2.0-3.0: Prophylaxis of venous thrombosis (high risk surgery); treatment of venous thrombosis; treatment of pulmonary embolism; prevention of systemic embolism; tissue heart valves; acute myocardial infarction; valvular heart disease; atrial fibrillation; recurrent systemic embolism.   BLOOD GAS, VENOUS    pH (Ven) 7.37      pCO2 (Ven) 38      pO2 (Ven) 45      Base Excess Calculation (Ven) -3      HCO3 Calculation (Ven) 22      O2 Sat (Ven) 77     TYPE AND SCREEN    ABO A      Rh Positive      Antibody Screen Negative      Specimen Expiration [id number]     URINALYSIS WITH REFLEX CULTURE    Color,U Yellow      Transparency,U Clear      Glucose,U Negative      Bilirubin,U Negative      Ketone,U Negative      SG-Urn 1.035      Blood,U Negative      pH,U 5.5      Protein, Urine Negative      Urobilinogen,U Negative      Nitrite,U Negative      Leu,U Negative      Microscopic Exam,U Not Performed         Rads:   XR femur left 2+ views   Final Result   No acute findings.       THIS DOCUMENT HAS BEEN ELECTRONICALLY VERIFIED BY JESSICA SEMAN DO ON 06/12/2024 21:25:21      XR knee 1 or 2 views left   Final Result   No acute findings.       THIS DOCUMENT HAS BEEN ELECTRONICALLY VERIFIED BY JESSICA SEMAN DO ON 06/12/2024 21:25:43      XR shoulder 2+ views left   Final Result   No acute findings.       THIS DOCUMENT HAS BEEN ELECTRONICALLY VERIFIED BY JESSICA SEMAN DO ON 06/12/2024 21:26:37      CT head wo IV contrast   Final Result   No acute intracranial abnormality.       THIS DOCUMENT HAS BEEN ELECTRONICALLY VERIFIED BY JESSICA SEMAN DO ON 06/12/2024 20:11:56      CT cervical spine wo IV contrast   Final Result   No acute cervical spine fracture.       THIS DOCUMENT HAS BEEN  ELECTRONICALLY VERIFIED BY SHEPPARD SHOE MD ON 06/12/2024 19:59:08      CT abdomen pelvis w IV contrast   Final Result   1.   No acute intra-abdominopelvic trauma.    2.   Cholelithiasis.    3.   Fatty liver, followup LFTs.    4.   Appendectomy.       THIS DOCUMENT HAS BEEN ELECTRONICALLY VERIFIED BY SHEPPARD SHOE MD ON 06/12/2024 20:10:41      CT angiography chest for trauma with contrast   Final Result   Addendum (preliminary) 2 of 2      ---- ADDENDED REPORT ----   THIS REPORT CONTAINS FINDINGS THAT MAY BE CRITICAL TO PATIENT CARE. The    findings were verbally communicated via telephone conference with Dr.    Luvenia at    9:40 PM EST on 06/12/2024. The findings were acknowledged and understood.      THIS DOCUMENT HAS BEEN ELECTRONICALLY VERIFIED BY SHEPPARD SHOE MD ON    06/12/2024 21:41:16   ---ORIGINAL REPORT---   PROCEDURE INFORMATION:  Exam: CTA Chest With Contrast    Exam date and time: 06/12/2024 7:20 PM    Age: 38 years old    Clinical indication: Other: Trauma       TECHNIQUE:    Imaging protocol: Computed tomographic angiography of the chest with    contrast.    Exam focused on the arteries.    3D rendering (Not supervised by radiologist): MIP and/or 3D reconstructed    images were created by the technologist.    Radiation optimization: All CT scans at this facility use at least one of    these    dose optimization techniques: automated exposure control; mA and/or kV    adjustment per patient size (includes targeted exams where dose is matched    to    clinical indication); or iterative reconstruction.    Contrast material: OMNIPAQUE 350; Contrast volume: 100 ml; Contrast route:       INTRAVENOUS (IV);        COMPARISON:    CT CHEST ANGIO W AND WO IV CONTRAST 04/23/2023 3:17 AM       FINDINGS:    Limitations: Motion and streak artifacts.       Pulmonary arteries: No proximal pulmonary artery filling defects.    Aorta: Unremarkable. No aortic aneurysm. No aortic dissection.       Veins: Dense contrast right  subclavian vein and superior vena cava with    beam    hardening artifacts.       Lungs: Moderate hypoinflation. Posterior atelectasis. Mild pulmonary    vascular    crowding. Symmetric lung volumes. No segmental consolidations.    Pleural spaces: Unremarkable. No pneumothorax. No pleural effusion.    Heart: Unremarkable. No cardiomegaly. No pericardial effusion.    Lymph nodes: Unremarkable. No enlarged lymph nodes.       Bones/joints: Mild spine degenerative changes. No acute fractures. No    dislocation.    Soft tissues: Unremarkable.       IMPRESSION:   1.   No acute intrathoracic trauma.    2.   Moderate hypoinflation. Posterior atelectasis. Mild pulmonary    vascular    crowding.       THIS DOCUMENT HAS BEEN ELECTRONICALLY VERIFIED BY SHEPPARD SHOE MD ON    06/12/2024 20:08:04            Final   1.   No acute intrathoracic trauma.    2.   Moderate hypoinflation. Posterior atelectasis. Mild pulmonary vascular    crowding.       THIS DOCUMENT HAS BEEN ELECTRONICALLY VERIFIED BY SHEPPARD SHOE MD ON 06/12/2024 20:08:04      CT neck angio with contrast   Final Result   No acute vascular injury.       REFERENCES:    NASCET CRITERIA. The degree of stenosis in the cervical segment of the internal    carotid artery is based on NASCET criteria. Normal is no stenosis. Mild is less    than 50% stenosis. Moderate is 50-69% stenosis. Severe is 70% to 99% stenosis.    Total occlusion is no detectable patent lumen.       THIS DOCUMENT HAS BEEN ELECTRONICALLY VERIFIED BY JESSICA SEMAN DO ON 06/12/2024 20:17:40      XR shoulder 1 view left   Final Result   No acute fracture or dislocation.       THIS DOCUMENT HAS BEEN ELECTRONICALLY VERIFIED BY SHEPPARD SHOE MD ON 06/12/2024 19:54:11      XR pelvis  1 or 2 views   Final Result   No acute fracture or dislocation.       THIS DOCUMENT HAS BEEN ELECTRONICALLY VERIFIED BY SHEPPARD SHOE MD ON 06/12/2024 19:53:52      XR humerus left   Final Result   No acute fracture or dislocation.       THIS  DOCUMENT HAS BEEN ELECTRONICALLY VERIFIED BY SHEPPARD SHOE MD ON 06/12/2024 19:54:44      XR chest 1 view   Final Result   Mild hypoaeration. Pulmonary vascular crowding and minimal airspace disease.       THIS DOCUMENT HAS BEEN ELECTRONICALLY VERIFIED BY SHEPPARD SHOE MD ON 06/12/2024 18:25:50      US  limited bedside    (Results Pending)      Anastasia Fantasia, MD  General Surgery, PGY-1  Pager [phone]  06/13/24             [1]  Allergies  Allergen Reactions    Ketamine Anaphylaxis    Penicillins Anaphylaxis and Unknown     Has patient had a PCN reaction causing immediate rash, facial/tongue/throat swelling, SOB or lightheadedness with hypotension: Yes   Has patient had a PCN reaction causing severe rash involving mucus membranes or skin necrosis: No   Has patient had a PCN reaction that required hospitalization No   Has patient had a PCN reaction occurring within the last 10 years: Yes   If all of the above answers are NO, then may proceed with Cephalosporin use.    Other reaction(s): anaphylaxis/angioedema    Ketamine Other    Penicillin V Other   [2]  Current Facility-Administered Medications   Medication Dose Route Frequency Provider Last Rate Last Admin    acetaminophen (Tylenol) tablet 975 mg  975 mg Oral q6h Nicolette Pember, MD   975 mg at 06/12/24 2351    enoxaparin  (Lovenox ) syringe 50 mg  0.5 mg/kg Subcutaneous q12h [facility] Nicolette Pember, MD   50 mg at 06/12/24 2351    naLOXone (Narcan) injection 0.04 mg  0.04 mg Intravenous PRN Nicolette Pember, MD        ondansetron  (Zofran ) tablet 4 mg  4 mg Oral q6h PRN Nicolette Pember, MD        Or    ondansetron  (Zofran ) injection 4 mg  4 mg Intravenous q6h PRN Nicolette Pember, MD        oxyCODONE  (Roxicodone ) immediate release tablet 10 mg  10 mg Oral q4h PRN Nicolette Pember, MD        Or    oxyCODONE  (Roxicodone ) solution 10 mg  10 mg Oral q4h PRN Nicolette Pember, MD        oxyCODONE  (Roxicodone ) immediate release tablet 5 mg  5 mg Oral q4h PRN Nicolette  Pember, MD   5 mg at 06/12/24 2352    Or    oxyCODONE  (Roxicodone ) solution 5 mg  5 mg Oral q4h PRN Nicolette Pember, MD        potassium chloride  CR (Klor-Con ) ER tablet 40 mEq  40 mEq Oral Once [place], [place]        sodium chloride  flush 0.9 % 1-10 mL  1-10 mL Intravenous PRN Marit Rouse, MD        Tdap (Boostrix) vaccine (syringe) 0.5 mL  0.5 mL Intramuscular Once Marit Rouse, MD         Current Outpatient Medications   Medication Sig Dispense Refill    acetaminophen (Tylenol) 325 MG tablet Take 3 tablets by mouth every 6  hours for 7 days. 84 tablet 0    methocarbamol (Robaxin) 500 MG tablet Take 1 tablet by mouth 3 times a day as needed for muscle spasms. 15 tablet 0   [3]     Diagnosis Date    Atrial fibrillation (CMS/HCC)     Depression     TIA (transient ischemic attack)    [4]  Past Surgical History:  Procedure Laterality Date    HERNIA REPAIR     [5]  No family history on file.  "

## 2024-06-13 NOTE — Discharge Summary (Signed)
 "-------------------------------------------------------------------------------  Attestation signed by Lorrene Cagey, MD at 06/14/2024 10:55 AM (Updated)  Attending Attestation: This was a Physician/APP shared visit, of which I provided the substantive portion of the visit's history, physical exam, documentation, or medical decision making. I personally saw and evaluated the patient. I repeated the critical/key portions of the service and discussed with the APP. I personally reviewed the patient's imaging and laboratory results. 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  Notably, he is parainfluenza positive  He is ambulatory   Ok for discharge home    Lorrene Cagey, MD, FACS  Assistant Professor of Surgery  [facility]    -------------------------------------------------------------------------------    Inpatient Discharge Summary    BRIEF OVERVIEW  Admitting Provider: Sammi Lulas, MD  Discharge Provider: Omega Sic, [place]  Primary Care Physician at Discharge: None Pcp      Admission Date: 06/12/2024     Discharge Date: 06/13/2024       Medication List        START taking these medications      acetaminophen 325 MG tablet  Commonly known as: Tylenol  Take 3 tablets by mouth every 6 hours for 7 days.     methocarbamol 500 MG tablet  Commonly known as: Robaxin  Take 1 tablet by mouth 3 times a day as needed for muscle spasms.            CONTINUE taking these medications      apixaban  5 MG tablet  Commonly known as: Eliquis   Take 5 mg by mouth 2 times daily.     atorvastatin  40 mg tablet  Commonly known as: Lipitor   Take 40 mg by mouth daily.     busPIRone  10 MG tablet  Commonly known as: Buspar   Take 10 mg by mouth 2 times daily.     cloNIDine  0.1 MG  tablet  Commonly known as: Catapres   Take 0.1 mg by mouth 2 times daily.     gabapentin 300 MG capsule  Commonly known as: Neurontin  Take 600 mg by mouth 3 times a day.     lisinopril  10 MG tablet  Commonly known as: Prinivil , Zestril   Take 10 mg by mouth daily.     QUEtiapine  50 MG tablet  Commonly known as: SEROquel   Take 100 mg by mouth at bedtime.               Where to Get Your Medications        These medications were sent to [facility]  [address] [place], [place] [address]      Hours: 8AM-7PM [facility] Sat-Sun Phone: [phone]   acetaminophen 325 MG tablet  methocarbamol 500 MG tablet         Primary Discharge Diagnosis:  Fall from height of greater than 3 feet    Outpatient Follow-Up:  Future Appointments   Date Time Provider Department Center   06/20/2024  9:00 AM Gerard Bunker, MD [facility]         Test Results Pending at Discharge:  None    Problem List:  Digestive  Cholelithiasis  Noted on CTAP  LFTs encouraging  Pt denies any abd pain, nausea, vomiting  Discussed result with patient on 11/13    Musculoskeletal  Sternal pain  No obvious fx on imaging  Bay team discussed with radiology possible artifact over region that pt demonstrates ttp  EKG and troponin negative  Telemetry overnight  Pt reports pain controlled and demonstrates ability to perform independent movements without difficulty    Injury of left shoulder  C/f L shoulder dislocation.   S/p reduction in bay by orthopedics team with improved pain, and able to tolerate ROM.   Pt placed into a sling for comfort.   Will arrange outpatient follow up.   Ok to discharge from orthopaedic perspective.   Pt reports history of similar and reports pain is controlled prior to discharge.    Infectious/Inflammatory  Parainfluenza  + parainfluenza virus  Discussed supportive treatments with patient and recommendations of hand washing, distancing, etc. Given diagnosis  On RA    Other  * Fall from height of greater than 3  feet- (present on admission)  Trauma scans  Denies LOC        DETAILS OF HOSPITAL STAY    Reason for Hospitalization:  Fall from height of greater than 3 feet [facility]    Hospital Course:  HPI: Evan Woods is a 38 y.o. male with PMHx significant for A-fib (on Eliquis ), Leukemia (in remission), schizoaffective disorder, and CVA (per chart review) who presents to the ED via EMS as an ECHO TTA due to fall. Per [facility], the patient was on the roof of his house today (06/12/2024) when he fell 20 ft at 1600. EMS reports the patient did not have LOC and ambulated to the fire station across the street for assistance after his fall.     Injuries/Problem:  - L shoulder dislocation  - + parainfluenza  - sternal ttp    Procedures:  - L shoulder reduction in bay by orthopedics team      Incidental Findings: (reviewed till 11/13)  - mucosal thickening throughout the bilateral ethmoid air cells with large mucous retention cysts in the maxillary sinuses   - C-spine: minimal dextroscoliosis.  Minimal anterior osteophytes.   - Tiny calcified gallstone. Partially distended gallbladder: liver function tests encouraging, no complaints of abdominal pain and no tenderness to palpation - please follow up with your PCP  - fatty liver  - Moderate hypoinflation. Posterior atelectasis. Mild pulmonary vascular crowding. Symmetric lung volumes. No segmental consolidations   - + parainfluenza virus    Hospital Course:  11/12 - admitted overnight for telemetery and pain control. Med history consult today. Ambulatory independently without difficulty. FU with orthopedics. Pain controlled. Replete K. Denies abd pain.      Discharge Condition:  Improved    Discharge Disposition:  Home    Discharge Exam:  Physical Exam  Vitals reviewed.   Constitutional:       General: He is not in acute distress.     Appearance: Normal appearance. He is not ill-appearing.   Cardiovascular:      Rate and Rhythm: Normal rate and regular rhythm.   Pulmonary:       Effort: Pulmonary effort is normal. No respiratory distress.      Breath sounds: Normal breath sounds.      Comments: On RA  Chest:      Chest wall: Tenderness (midsternal) present.   Abdominal:      General: There is no distension.      Palpations: Abdomen is soft.      Tenderness: There is no abdominal  tenderness. There is no guarding or rebound.   Musculoskeletal:      Comments: Decreased ROM L shoulder as noted to be in sling. NVI distally with good strength. Otherwise ROM extremities spontaneous and without apparent deficit.   Skin:     General: Skin is warm and dry.   Neurological:      Mental Status: He is alert and oriented to person, place, and time.   Psychiatric:         Mood and Affect: Mood normal.         Behavior: Behavior normal.           Time Spent in Coordination of Discharge:  60 min    Information given to patient/family/representative:  Information regarding appts, diagnoses, post-hospital care instructions and education materials is provided to the patient/family/rep in writing on the After Visit Summary at the time of Discharge. A copy for the 'Provider' (PCP or Referring) is also given.    Discharge Instructions to Patient  Discharge  You are medically stable to be discharge to home from the hospital. You will follow-up with multiple specialists as directed below. If you have any further medical issues resulting from the trauma, you may call the Trauma Surgery Service at [phone] or [phone] for after normal business hours.        Injuries:    Sternal Pain  You were noted to have sternal pain. There was some artifact on your CT around that area of pain, but no clear fracture. You will be given medication for pain control to be used as needed. An EKG and a cardiac enzyme were tested, which were both encouraging.    You were also found to be positive for parainfluenza virus - a type of respiratory illness. This is managed with supportive treatments. Please utilize contact precautions  when around other people to avoid transmitting this virus to others. Please wash your hands frequently.    Deep breathing exercises will prevent increased pain associated with the trauma and underlying lung injury, as well as prevent pneumonia from developing. Take home your breathing machines with you. You will need to continue to do these when you are awake. At least 10  times every hour. These machines will exercise your lungs & help prevent you from developing pneumonia.       Left shoulder dislocation:  You were evaluated by our orthopedics team with reduction of this extremity. You were placed into a sling for comfort. Will arrange outpatient follow up on 06/20/24.      Below are ongoing problems that need to be addressed:     Pain Control:   It is normal to have pain and this will change with activity levels. This should decrease over time. You may have crampy abdominal pain and bloating as well. This, too, should improve over time. Here are some tips for controlling your pain.  - Acetaminophen- Tylenol is also an excellent pain reliever for minor pain.  - Do not take Acetaminophen while taking Percocet or Norco  or other medications that contain Acetaminophen. Taking more than 4,000mg  of Acetaminophen in 24 hours can cause severe liver damage. - please avoid taking high doses of this if you have a history of abnormal liver tests.  - Please call the office if you have any questions.  - If you are taking pain medication and your pain becomes progressively worse, call your surgeons office.  - If you have severe or increasing abdominal pain with nausea and vomiting,  call your surgeons office.  - Check with your surgeons office before taking additional pain medications because this can lead to serious side effects.    You will be discharged with a prescription for some pain medication. You should take these as directed on the bottle. You may use an over-the-counter laxative or stool softener as needed until  your bowel function returns to normal. Pain medications and immobility can cause constipation. You need to follow up with your Primary Care Provider and/or your pain management provider as soon as possible to discuss pain management strategies and, if needed, additional refills of you pain med prescription.     Add to your pain management regimen, non-narcotic pain medications, such as acetaminophen (Tylenol), unless contraindicated.     * Please also note that this reduction regimen is applicable to your other pain medications as well. Please use this weaning process to discontinue your pain medications focusing on discontinuation of all controlled substances (narcotics such as oxycodone , dilaudid, gabapentin, etc.) first, then your other multimodal medications (muscle relaxer, lidocaine patches, tylenol, etc.)      Other Medications:  Your internist or family physician should handle other prescription medications, including those that you were on prior to the injury. It is recommended that you follow up with your family physician within a few weeks of discharge from your hospital stay to make sure all your pre-existing health problems were not affected by the trauma. You should resume your home medications as directed. Be careful when combining you previous home medications with the new medications for pain control. The combination can make you lethargic, sleepy, and may lead to further injury if not mindfully administered. Please review your medications with your primary care provider as soon as possible.  You should resume taking all of your home medications.    You were noted to be on Eliquis  for your history of afib. Please continue to follow up with your PCP regularly regarding this medication. You were noted to be in sinus rhythm during our evaluation here.       Incidental Findings:  You had some incidental findings noted on the CT scans and X-rays associated with your hospital stay. There is no immediate  follow-up that is needed, however you should review these findings with you Primary Care Provider (PCP) during your next visit. Please take this discharge report with you to your next PCP appointment for review.     Follow up with your Primary Care Provider for these incidental Finding:   - mucosal thickening throughout the bilateral ethmoid air cells with large mucous retention cysts in the maxillary sinuses   - C-spine: minimal dextroscoliosis.  Minimal anterior osteophytes.   - Tiny calcified gallstone. Partially distended gallbladder: liver function tests encouraging, no complaints of abdominal pain and no tenderness to palpation - please follow up with your PCP  - fatty liver  - Moderate hypoinflation. Posterior atelectasis. Mild pulmonary vascular crowding. Symmetric lung volumes. No segmental consolidations   - + parainfluenza virus       Activity: (unless otherwise instructed depending on your injuries)  - DO NOT DRIVE OR OPERATE HEAVY MACHINERY WHILE TAKING NARCOTIC PAIN MEDICATIONS. DO NOT COMBINE WITH HIGH DOSES OF OTHER SEDATING MEDICATION OR ALCOHOL.  - If you have had surgery on your chest/belly during your admission, no lifting greater than 10 pounds for 6 weeks to avoid developing a hernia at the incision.  - Take naps/rest periods if you feel the need. It is normal to feel tired;  you will need to plan your activities at a slower pace.  - You may walk flights of stairs, perform non-strenuous activities, ride in a car and shower.  - If you have questions about activities and what you can and cant do, please call the office.       **CALL 911 IF YOU DEVELOP**  - CHEST PAIN  - SUDDEN SHORTNESS OF BREATH  - FAINTING AND/OR LOSS OF CONSCIOUSNESS      To reach providers in the [facility]:  Call Telepage at [phone] and page the discharge service or call [phone]. [facility] provider numbers can also be accessed via the web at: [email or web address]  "

## 2024-07-12 ENCOUNTER — Inpatient Hospital Stay
Admit: 2024-07-12 | Discharge: 2024-07-12 | Disposition: A | Discharge status: Home / Self Care | Payer: MEDICAID | Arrived: WI | Attending: Emergency Medicine

## 2024-07-12 DIAGNOSIS — I10 Essential (primary) hypertension: Principal | ICD-10-CM

## 2024-07-12 MED ORDER — NIFEDIPINE ER OSMOTIC RELEASE 30 MG PO TB24
30 | ORAL | Status: AC
Start: 2024-07-12 — End: 2024-07-12
  Administered 2024-07-12: 23:00:00 30 mg via ORAL

## 2024-07-12 MED FILL — NIFEDIPINE ER OSMOTIC RELEASE 30 MG PO TB24: 30 mg | ORAL | Qty: 1 | Fill #0

## 2024-07-12 NOTE — Virtual Health (Signed)
 "Evan Woods  1986/05/05     EMERGENCY DEPARTMENT TELEPSYCHIATRY EVALUATION    07/12/24    Chief Complaint:  I got severe PTSD and the past week the flash backs have been getting worse  HPI: Patient is a 38 y.o. male who presents for psychiatric evaluation. Patient presented to the ED on 07/12/24 from home. Per ED   Pt arrives under voluntary status for PTSD exacerbation x1 week. Pt denies si/hi. Pt requests psychiatric evaluation for medication assistance.           Presents for psychiatric evaluation for exacerbation of PTSD stating that he has nightmares and flashbacks of gotten worse since moving from [place]  to [place] .  He is currently not set up with any psychiatry or therapy in the area but he does have a follow-up at [facility] in [place]  in 1 week.At time of assessment patient did not appear to have any difficulty concentrating. Patient denies having any difficulty with their appetite. Patient currently denying any suicidal ideations or thoughts of self-harm. Patient also denying any homicidal ideations.     Patient denies currently experiencing any auditory or visual hallucinations. Patient denies ideas of reference. At time of assessment patient not noted to experience thought insertion/blocking and did not have disorganized speech and/or behavior.     Patient denies any substance use.    Past Psychiatric History:  Previous Diagnoses/symptoms: PTSD  Previous suicide attempts/self-harm: Denies  Inpatient psychiatric hospitalizations: no  Current outpatient psychiatric provider: At [facility]  Current therapist: 2 times a week  Previous psychiatric medication trials: See MAR  Current psychiatric medications: Trazadone, Seroquel , Prazosin , Buspar   History of ECT: unknown  Family Psychiatric History: Denies    Substance Abuse History:  Tobacco: Denies  Alcohol: Denies  Marijuana: Denies  Stimulant: Denies  Opiates: Denies  Benzodiazepine: Denies  Other illicit drug usage: Denies  History of  substance/alcohol abuse treatment: Denies    Social History:  Education: Furniture Conservator/restorer  Living Situation/Interest: alone  Marital/Committed relationship and parenting hx: single  Occupation: Engineer, 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 History/Hx of Violence: Denies  Spiritual History: Denies  Psychological trauma, neglect, or abuse: denies hx of trauma/abuse   Access to guns or other weapons: denies having access to state farm     Past Medical History:  Active Ambulatory Problems     Diagnosis Date Noted    No Active Ambulatory Problems     Resolved Ambulatory Problems     Diagnosis Date Noted    No Resolved Ambulatory Problems     Past Medical History:   Diagnosis Date    Acute MI (HCC)     Atrial fibrillation (HCC)     Hypercholesteremia     Hypertension     Leukemia (HCC)     Pulmonary embolism (HCC) 2018       Past Surgical History:        Procedure Laterality Date    APPENDECTOMY      BONE MARROW TRANSPLANT  2017    CORONARY ANGIOPLASTY WITH STENT PLACEMENT      2019, 2015 x 2    ORTHOPEDIC SURGERY      shoulder surgery      Allergies:  Allergies   Allergen Reactions    Ketamine And Related     Penicillins       Medications:  No current facility-administered medications for this encounter.    Current Outpatient Medications:     QUEtiapine  (SEROQUEL ) 50 MG tablet,  Take 2 tablets by mouth nightly, Disp: , Rfl:     traZODone (DESYREL) 50 MG tablet, Take by mouth nightly, Disp: , Rfl:     prazosin  (MINIPRESS ) 2 MG capsule, Take 1 capsule by mouth nightly, Disp: , Rfl:   Not in a hospital admission.  Prior to Admission medications   Medication Sig Start Date End Date Taking? Authorizing Provider   QUEtiapine  (SEROQUEL ) 50 MG tablet Take 2 tablets by mouth nightly 01/31/24  Yes [provider]   traZODone (DESYREL) 50 MG tablet Take by mouth nightly   Yes [provider]   prazosin  (MINIPRESS ) 2 MG capsule Take 1 capsule by mouth nightly    [provider]      Problem List:    Active Problems:    * No active hospital problems. *  Resolved Problems:    * No resolved hospital problems. *       OBJECTIVE  Vital Signs:  Vitals:    07/12/24 1703   BP: (!) 160/108   Pulse: (!) 121   Resp: 18   Temp: 97.1 F (36.2 C)   SpO2: 98%     Labs:  Reviewed. UDS: Pending  ETOH: Pending    No results found for this or any previous visit (from the past 48 hours).    Imaging:   No orders to display     Radiology:  XR SHOULDER LEFT (MIN 2 VIEWS)  Result Date: 06/12/2024  PROCEDURE INFORMATION: Exam: XR Left Shoulder Exam date and time: 06/12/2024 8:36 PM Age: 38 years old Clinical indication: Other: Post reduction TECHNIQUE: Imaging protocol: Radiologic exam of the left shoulder. Views: 2 or more views.   COMPARISON: DX XR SHOULDER 1 VIEW LEFT 06/12/2024 6:48 PM FINDINGS: Bones/joints: Normal. Soft tissues: No focal abnormality is appreciated.     No acute findings. THIS DOCUMENT HAS BEEN ELECTRONICALLY VERIFIED BY JESSICA SEMAN DO ON 06/12/2024 21:26:37    XR KNEE LEFT (1-2 VIEWS)  Result Date: 06/12/2024  PROCEDURE INFORMATION: Exam: XR Left Knee Exam date and time: 06/12/2024 8:32 PM Age: 38 years old Clinical indication: Other: Trauma TECHNIQUE: Imaging protocol: Radiologic exam of the left knee. Views: 1 or 2 views. COMPARISON: DX XR FEMUR 2+ VW LEFT 06/12/2024 7:10 PM FINDINGS: Bones/joints: Normal. Soft tissues: No focal abnormality is appreciated.     No acute findings. THIS DOCUMENT HAS BEEN ELECTRONICALLY VERIFIED BY JESSICA SEMAN DO ON 06/12/2024 21:25:43    XR FEMUR LEFT (MIN 2 VIEWS)  Result Date: 06/12/2024  PROCEDURE INFORMATION: Exam: XR Left Femur Exam date and time: 06/12/2024 7:10 PM Age: 38 years old Clinical indication: Other: Trauma TECHNIQUE: Imaging protocol: Radiologic exam of the left femur. Views: 2 views. COMPARISON: DX XR PELVIS 1-2 VIEWS 06/12/2024 7:08 PM FINDINGS: Bones/joints: Normal. Soft tissues: No focal abnormality is appreciated.     No acute findings. THIS DOCUMENT  HAS BEEN ELECTRONICALLY VERIFIED BY JESSICA SEMAN DO ON 06/12/2024 21:25:21    CT neck angio with contrast  Result Date: 06/12/2024  PROCEDURE INFORMATION: Exam: CTA Neck With Contrast Exam date and time: 06/12/2024 7:20 PM Age: 37 years old Clinical indication: Other: Trauma TECHNIQUE: Imaging protocol: Computed tomographic angiography of the neck with contrast. Exam focused on the cervical segments of the vasculature. 3D rendering (Not supervised by radiologist): MIP and/or 3D reconstructed images were created by the technologist. Radiation optimization: All CT scans at this facility use at least one of these dose optimization techniques: automated exposure control; mA and/or kV adjustment  per patient size (includes targeted exams where dose is matched to clinical indication); or iterative reconstruction. Contrast material: OMNIPAQUE 350; Contrast volume: 100 ml; Contrast route: INTRAVENOUS (IV);   COMPARISON: CT CHEST ANGIO W AND WO IV CONTRAST 04/23/2023 3:17 AM FINDINGS: Right common carotid artery: No stenosis. No dissection or occlusion. Right internal carotid artery: No stenosis of the extracranial segment. No dissection or occlusion. Right external carotid artery: No occlusion or stenosis of the origin.   Left common carotid artery: No stenosis. No dissection or occlusion. Left internal carotid artery: No stenosis of the extracranial segment. No dissection or occlusion. Left external carotid artery: No occlusion or stenosis of the origin.   Right vertebral artery: No stenosis. No dissection or occlusion. Left vertebral artery: No stenosis. No dissection or occlusion. Soft tissues: No acute finding. Bones/joints: No acute fracture.     No acute vascular injury. REFERENCES: NASCET CRITERIA. The degree of stenosis in the cervical segment of the internal carotid artery is based on NASCET criteria. Normal is no stenosis. Mild is less than 50% stenosis. Moderate is 50-69% stenosis. Severe is 70% to 99% stenosis.  Total occlusion is no detectable patent lumen. THIS DOCUMENT HAS BEEN ELECTRONICALLY VERIFIED BY JESSICA SEMAN DO ON 06/12/2024 20:17:40    CT HEAD WO CONTRAST  Result Date: 06/12/2024  PROCEDURE INFORMATION: Exam: CT Head Without Contrast Exam date and time: 06/12/2024 7:20 PM Age: 38 years old Clinical indication: Other: Trauma TECHNIQUE: Imaging protocol: Computed tomography of the head without contrast. Radiation optimization: All CT scans at this facility use at least one of these dose optimization techniques: automated exposure control; mA and/or kV adjustment per patient size (includes targeted exams where dose is matched to clinical indication); or iterative reconstruction. COMPARISON: CT CERVICAL SPINE WO CONTRAST 06/12/2024 7:20 PM FINDINGS: Brain: No acute hemorrhage. No extra-axial fluid collection. No mass effect. Gray-white matter differentiation is maintained. Cerebral ventricles: No ventriculomegaly. Paranasal sinuses: There is mucosal thickening throughout the bilateral ethmoid air cells with large mucous retention cysts in the maxillary sinuses. No fluid levels. Mastoid air cells: Visualized mastoid air cells are well aerated. Bones: Unremarkable. No acute fracture. Soft tissues: No acute finding.     No acute intracranial abnormality. THIS DOCUMENT HAS BEEN ELECTRONICALLY VERIFIED BY JESSICA SEMAN DO ON 06/12/2024 20:11:56    CT abdomen pelvis w IV contrast  Result Date: 06/12/2024  PROCEDURE INFORMATION: Exam: CT Abdomen And Pelvis With Contrast Exam date and time: 06/12/2024 7:20 PM Age: 38 years old Clinical indication: Other: Trauma TECHNIQUE: Imaging protocol: Computed tomography of the abdomen and pelvis with contrast. Radiation optimization: All CT scans at this facility use at least one of these dose optimization techniques: automated exposure control; mA and/or kV adjustment per patient size (includes targeted exams where dose is matched to clinical indication); or iterative  reconstruction. Contrast material: OMNIPAQUE 350; Contrast volume: 100 ml; Contrast route: INTRAVENOUS (IV);   COMPARISON: CT ANGIOGRAM ABDOMEN PELVIS W CONTRAST 04/23/2023 3:17 AM FINDINGS: Limitations: Streak artifacts. Liver: Hypodense liver compatible with fatty infiltration. No focal hepatic lesions or abnormal enhancement. Gallbladder and biliary ducts: Tiny calcified gallstone. Partially distended gallbladder. No wall thickening or pericholecystic fluid. No biliary ductal dilatation. Pancreas: Unremarkable. No ductal dilation. Spleen: Unremarkable. No splenomegaly. Adrenal glands: Unremarkable. No mass. Kidneys and ureters: Unremarkable. No hydronephrosis. No calcified renal stones. No ureteral dilation. Stomach and bowel: Unremarkable. No obstruction. No mucosal thickening. No diverticulosis. Appendix: Status post appendectomy. Intraperitoneal space: Unremarkable. No free air. No significant fluid collection. Vasculature: Unremarkable.  No abdominal aortic aneurysm. Lymph nodes: Unremarkable. No enlarged lymph nodes. Urinary bladder: Distended bladder. No wall thickening or calcified stones. Reproductive: Unremarkable as visualized. Bones/joints: Unremarkable. No acute fracture. Soft tissues: Unremarkable.     1.   No acute intra-abdominopelvic trauma. 2.   Cholelithiasis. 3.   Fatty liver, followup LFTs. 4.   Appendectomy. THIS DOCUMENT HAS BEEN ELECTRONICALLY VERIFIED BY SHEPPARD SHOE MD ON 06/12/2024 20:10:41    CT angiography chest for trauma with contrast  Result Date: 06/12/2024  PROCEDURE INFORMATION: Exam: CTA Chest With Contrast Exam date and time: 06/12/2024 7:20 PM Age: 38 years old Clinical indication: Other: Trauma TECHNIQUE: Imaging protocol: Computed tomographic angiography of the chest with contrast. Exam focused on the arteries. 3D rendering (Not supervised by radiologist): MIP and/or 3D reconstructed images were created by the technologist. Radiation optimization: All CT scans at this facility  use at least one of these dose optimization techniques: automated exposure control; mA and/or kV adjustment per patient size (includes targeted exams where dose is matched to clinical indication); or iterative reconstruction. Contrast material: OMNIPAQUE 350; Contrast volume: 100 ml; Contrast route: INTRAVENOUS (IV);   COMPARISON: CT CHEST ANGIO W AND WO IV CONTRAST 04/23/2023 3:17 AM FINDINGS: Limitations: Motion and streak artifacts. Pulmonary arteries: No proximal pulmonary artery filling defects. Aorta: Unremarkable. No aortic aneurysm. No aortic dissection. Veins: Dense contrast right subclavian vein and superior vena cava with beam hardening artifacts. Lungs: Moderate hypoinflation. Posterior atelectasis. Mild pulmonary vascular crowding. Symmetric lung volumes. No segmental consolidations. Pleural spaces: Unremarkable. No pneumothorax. No pleural effusion. Heart: Unremarkable. No cardiomegaly. No pericardial effusion. Lymph nodes: Unremarkable. No enlarged lymph nodes. Bones/joints: Mild spine degenerative changes. No acute fractures. No dislocation. Soft tissues: Unremarkable.     1.   No acute intrathoracic trauma. 2.   Moderate hypoinflation. Posterior atelectasis. Mild pulmonary vascular crowding. THIS DOCUMENT HAS BEEN ELECTRONICALLY VERIFIED BY SHEPPARD SHOE MD ON 06/12/2024 20:08:04    CT cervical spine wo IV contrast  Result Date: 06/12/2024  PROCEDURE INFORMATION: Exam: CT Cervical Spine Without Contrast Exam date and time: 06/12/2024 7:20 PM Age: 38 years old Clinical indication: Other: Trauma TECHNIQUE: Imaging protocol: Computed tomography of the cervical spine without contrast. Radiation optimization: All CT scans at this facility use at least one of these dose optimization techniques: automated exposure control; mA and/or kV adjustment per patient size (includes targeted exams where dose is matched to clinical indication); or iterative reconstruction. COMPARISON: CT NECK ANGIO W IV CONTRAST  06/12/2024 7:20 PM FINDINGS: Limitations: Slightly rotated left. Bones: No acute fracture. Normal alignment. No significant disc bulge or herniation. No severe spinal canal stenosis. No significant neural foraminal narrowing.  Minimal dextroscoliosis.  Minimal anterior osteophytes. Paranasal sinuses: Mostly opacified included lower posterior left maxillary sinus. Dependent mucosal thickening included right maxillary sinus. Lungs: Lung apices are unremarkable. Soft tissues: Unremarkable.     No acute cervical spine fracture. THIS DOCUMENT HAS BEEN ELECTRONICALLY VERIFIED BY SHEPPARD SHOE MD ON 06/12/2024 19:59:08    XR humerus left  Result Date: 06/12/2024  PROCEDURE INFORMATION: Exam: XR Left Humerus Exam date and time: 06/12/2024 6:52 PM Age: 38 years old Clinical indication: Other: Trauma TECHNIQUE: Imaging protocol: Radiologic exam of the left humerus. Views: 2 or more views. COMPARISON: DX XR HUMERUS LEFT 04/22/2023 5:37 PM FINDINGS: Bones/joints: Unremarkable. Soft tissues: Unremarkable.     No acute fracture or dislocation. THIS DOCUMENT HAS BEEN ELECTRONICALLY VERIFIED BY SHEPPARD SHOE MD ON 06/12/2024 19:54:44    XR shoulder 1 view left  Result Date: 06/12/2024  PROCEDURE INFORMATION: Exam: XR Left Shoulder Exam date and time: 06/12/2024 6:48 PM Age: 38 years old Clinical indication: Other: Rule out dislocation TECHNIQUE: Imaging protocol: Radiologic exam of the left shoulder. Views: 1 view. COMPARISON: DX XR HUMERUS LEFT 04/22/2023 5:37 PM FINDINGS: Tubes, catheters and devices: EKG leads project over chest. Bones/joints: Unremarkable. Soft tissues: Unremarkable.     No acute fracture or dislocation. THIS DOCUMENT HAS BEEN ELECTRONICALLY VERIFIED BY SHEPPARD SHOE MD ON 06/12/2024 19:54:11    XR PELVIS (1-2 VIEWS)  Result Date: 06/12/2024  PROCEDURE INFORMATION: Exam: XR Pelvis Exam date and time: 06/12/2024 7:08 PM Age: 38 years old Clinical indication: Other: Trauma TECHNIQUE: Imaging protocol: Radiologic exam of the  pelvis. Views: 1 or 2 view. COMPARISON: CT ANGIOGRAM ABDOMEN PELVIS W CONTRAST 04/23/2023 3:17 AM FINDINGS: Limitations: Slightly rotated right. Bones/joints: Unremarkable. No acute fracture. Soft tissues: Unremarkable.     No acute fracture or dislocation. THIS DOCUMENT HAS BEEN ELECTRONICALLY VERIFIED BY SHEPPARD SHOE MD ON 06/12/2024 19:53:52    XR CHEST 1 VIEW  Result Date: 06/12/2024  PROCEDURE INFORMATION: Exam: XR Chest Exam date and time: 06/12/2024 5:33 PM Age: 38 years old Clinical indication: Other: Trauma TECHNIQUE: Imaging protocol: Radiologic exam of the chest. Views: 1 view. COMPARISON: CT CHEST ANGIO W AND WO IV CONTRAST 04/23/2023 3:17 AM FINDINGS: Limitations: Lordotic frontal view with clavicular heads projecting above the lung apex. Tilted right. Tubes, catheters and devices: EKG leads project over chest. Lungs: Mild hypoaeration. Pulmonary vascular crowding and minimal airspace disease. Symmetric lung volumes. No segmental consolidations. Pleural spaces: No significant pleural effusion. No pneumothorax. Heart/Mediastinum: No cardiomegaly. Prominent mediastinum. Bones/joints: Unremarkable.     Mild hypoaeration. Pulmonary vascular crowding and minimal airspace disease. THIS DOCUMENT HAS BEEN ELECTRONICALLY VERIFIED BY SHEPPARD SHOE MD ON 06/12/2024 18:25:50      Review of Systems:  Reports no current cardiovascular, respiratory, gastrointestinal, genitourinary, integumentary, neurological, musculoskeletal, or immunological symptoms today.   PSYCHIATRIC: See HPI above.    PSYCHIATRIC EXAMINATION / MENTAL STATUS EXAM    Level of consciousness:  within normal limits   Appearance:  well-appearing.  Does appear stated age. No acute distress.  Behavior/Motor: no psychomotor agitation, retardation, or abnormal movements noted  Attitude toward examiner:  cooperative and attentive  SI/HI:Denies SI/HI  Sleep: Decreased  Speech:  spontaneous, normal rate, and normal volume  Mood: within normal limits  Affect: mood  congruent  Thought Processes:  linear.  No tangentiality or circumstantiality. No flight of ideas or loosening of associations.  Thought Content:  Homocidal ideation Denies  Suicidal Ideation:  denies suicidal ideation  Delusions:  no evidence of delusions  Perceptual Disturbance:  denies any perceptual disturbance. No delusions or other perceptual abnormalities.  Hallucinations: Denies AVOT-H  Cognition:  oriented to person, place, and time   Concentration: intact  Memory: intact, though not formally tested.  Insight: fair   Judgement: fair   Fund of Knowledge: adequate      Risk Assessment:  C-SSRS Score       07/12/2024     5:01 PM   C-SSRS Suicide Screening   1) Within the past month, have you wished you were dead or wished you could go to sleep and not wake up?  No   2) Have you actually had any thoughts of killing yourself?  No   6) Have you ever done anything, started to do anything, or prepared to do anything to end your life? No    :  Protective Factors:  Protective: Age >25 and <55, Denies depression, Denies suicidal ideation, Does not have lethal plan, Does not have access to guns, Patient is verbally contracting for safety, No prior suicide attempts, Patient has social or family support, No active psychosis or cognitive dysfunction, Has outpatient services in place, and Compliant with recommended medications  Risk Factors:  Risk Factors: No collateral information to support safety      Overall Level Suicide Risk: TelePsych CSSRS Risk Level: Low Risk    Assessment:   Patient is a 38 y.o.  male who presents for Psychiatric Evaluation.  Presents for psychiatric evaluation for exacerbation of PTSD stating that he has nightmares and flashbacks of gotten worse since moving from [place]  to [place] .  He is currently not set up with any psychiatry or therapy in the area but he does have a follow-up at [facility] in [place]  in 1 week.At time of assessment patient did not appear to have any difficulty  concentrating. Patient denies having any difficulty with their appetite. Patient currently denying any suicidal ideations or thoughts of self-harm. Patient also denying any homicidal ideations.     Patient denies currently experiencing any auditory or visual hallucinations. Patient denies ideas of reference. At time of assessment patient not noted to experience thought insertion/blocking and did not have disorganized speech and/or behavior.     Patient denies any substance use.    Recommend increasing prazosin  from 2 mg at night to 3 mg at night to address patient's increased incidence of nightmares associated PTSD.  Patient to follow-up with local psychiatrist in 1 week.    Dx:   PTSD    Plan:  The patient is cleared to be discharged from a psychiatric point of view, when medically appropriate.  Patient does not meet criteria for a psychiatric hold at this time  Legal Status: VOLUNTARY.  Patient is currently not suicidal homicidal or experiencing any psychosis.  OK to d/c suicide and elopement precautions.  Medical co-morbidities: Management per medical providers, appreciate assistance.  Medication Recommendations: See note  Reviewed treatment plan with patient including risks, benefits, alternatives, and side effects of medications, and any/all black box warnings. Patient verbalized understanding.  Patient had an opportunity to ask questions and address concerns. Obtained informed consent for treatment.  Medical records, labs, and diagnostic tests reviewed.   Re-consult for any new changes or concerns. Thank you for this consult.  Discussed recommendations with MD Odis at time of consult completion.    TelePsych recommendations:Discharge    Legal hold: No Involuntary Hold    Telepsychiatry will sign off. Thank you for allowing us  to participate in the care of this patient. Please send message or call via [employer] if anything more is required.     Electronically signed by Duwaine JAYSON Maywood, APRN - CNP on  07/12/2024 at 5:09 PM.    END OF NOTE  -------------------------                            Dene ONEIDA Lesches, was evaluated through a synchronous (real-time) audio-video encounter. The patient (and/or guardian if applicable) is aware that this is a billable service, which includes applicable co-pays. This virtual visit was conducted with patient's (and/or legal guardian's) consent. Patient identification was verified, and a caregiver was present when appropriate.  The patient was located at [employer] (Appt Department): [facility]  [facility]  EM[facility] DEPART[facility]  [address]  [place]  [place] [address]  Loc: [phone]  The provider was located at Home (City/State): [place]  Confirm you are appropriately licensed, registered, or certified to deliver care in the state where the patient is located as indicated above. If you are not or unsure, please re-schedule the visit: Yes, I confirm.   [facility] Consult to [facility] Provider  Consult performed by: Melvie Paglia C, APRN - CNP  Consult ordered by: Earles, Christel, LCSW  Reason for consult: psychiatric evaluation           Total time spent on this encounter: Not billed by time    --Duwaine JAYSON Maywood, APRN - CNP on 07/12/2024 at 5:09 PM    An electronic signature was used to authenticate this note.    "

## 2024-07-12 NOTE — ED Provider Notes (Signed)
 "[facility] EMERGENCY DEPT  EMERGENCY DEPARTMENT HISTORY AND PHYSICAL EXAM      Date: 07/12/2024  Patient Name: Evan Woods  MRN: [id number]  Birthdate: 1986-01-08  Date of evaluation: 07/12/2024  Provider: Shaunna Gibes, MD   Note Started: 5:04 PM EST 07/12/24    HISTORY OF PRESENT ILLNESS     Chief Complaint   Patient presents with    Mental Health Problem       History Provided By: Patient    HPI: Evan Woods is a 38 y.o. male Pt arrives under voluntary status for PTSD exacerbation x1 week. Pt denies si/hi. Pt requests psychiatric evaluation for medication assistanc     PAST MEDICAL HISTORY   Past Medical History:  Past Medical History:   Diagnosis Date    Acute MI [facility])     Atrial fibrillation (HCC)     Hypercholesteremia     Hypertension     Leukemia (HCC)     Pulmonary embolism (HCC) 2018       Past Surgical History:  Past Surgical History:   Procedure Laterality Date    APPENDECTOMY      BONE MARROW TRANSPLANT  2017    CORONARY ANGIOPLASTY WITH STENT PLACEMENT      2019, 2015 x 2    ORTHOPEDIC SURGERY      shoulder surgery       Family History:  No family history on file.    Social History:  Social History     Tobacco Use    Smoking status: Every Day     Current packs/day: 0.25     Types: Cigarettes    Smokeless tobacco: Never   Substance Use Topics    Alcohol use: Never    Drug use: No       Allergies:  Allergies   Allergen Reactions    Ketamine And Related     Penicillins        PCP: Myra Locus, MD    Current Meds:   No current facility-administered medications for this encounter.     Current Outpatient Medications   Medication Sig Dispense Refill    QUEtiapine  (SEROQUEL ) 50 MG tablet Take 2 tablets by mouth nightly      traZODone (DESYREL) 50 MG tablet Take by mouth nightly      prazosin  (MINIPRESS ) 2 MG capsule Take 1 capsule by mouth nightly         Social Determinants of Health:   Social Drivers of Health     Tobacco Use: High Risk (05/31/2024)    Patient History     Smoking Tobacco Use: Every  Day     Smokeless Tobacco Use: Never     Passive Exposure: Not on file   Alcohol Use: Not At Risk (02/12/2024)    Received from [facility]    AUDIT-C     Q1: How often do you have a drink containing alcohol?: Never     Q2: How many drinks containing alcohol do you have on a typical day when you are drinking?: Patient does not drink     Q3: How often do you have six or more drinks on one occasion?: Never   Financial Resource Strain: Medium Risk (09/09/2023)    Received from [facility]    Overall Financial Resource Strain (CARDIA)     Difficulty of Paying Living Expenses: Somewhat hard   Food Insecurity: High Risk (02/12/2024)    Received from [facility]  Food Insecurity     Within the past 12 months, did the food you bought just not last and you didn't have money to get more?: Yes     Within the past 12 months, did you worry that your food would run out before you got money to buy more?: No   Transportation Needs: Low Risk (02/12/2024)    Received from [facility]    Transportation Needs     Within the past 12 months, has a lack of transportation kept you from medical appointments or from doing things needed for daily living?: No   Physical Activity: Not on file   Stress: Not on file   Social Connections: Low Risk  (08/18/2022)    Received from [employer] ([place], [place], [place], [place], [place])    Family and [employer]     Help with Day to Day Activities: Not on file     Feeling Lonely or Isolated: Not on file   Intimate Partner Violence: Not At Risk (12/05/2023)    Received from [facility]    HITS     Over the last 12 months how often did your partner physically hurt you?: Never     Over the last 12 months how often did your partner insult you or talk down to you?: Never     Over the last 12 months how often did your partner threaten you with physical harm?: Never     Over the last 12 months how often did your partner scream or curse at you?: Never    Depression: Not at risk (09/11/2023)    Received from [facility]    PHQ-2     Patient Health Questionnaire-2 Score: 0   Housing Stability: Low Risk (02/12/2024)    Received from [facility]    Housing Stability     Within the past 12 months, have you ever stayed: outside, in a car, in a tent, in an overnight shelter, or temporarily in someone else's home (i.e. couch-surfing)?: No     Are you worried about losing your housing? : No   Interpersonal Safety: Not At Risk (07/12/2024)    Interpersonal Safety Domain Source: IP Abuse Screening     Physical abuse: Denies     Verbal abuse: Denies     Emotional abuse: Denies     Financial abuse: Denies     Sexual abuse: Denies   Utilities: High Risk (02/12/2024)    Received from [facility]    Utilities     Within the past 12 months, have you been unable to get utilities (heat, electricity) when it was really needed?: Yes       PHYSICAL EXAM   Physical Exam  Vitals and nursing note reviewed.   Constitutional:       General: He is not in acute distress.     Appearance: Normal appearance. He is normal weight. He is not ill-appearing, toxic-appearing or diaphoretic.   HENT:      Head: Normocephalic and atraumatic.      Nose: Nose normal.      Mouth/Throat:      Mouth: Mucous membranes are moist.      Pharynx: No oropharyngeal exudate.   Eyes:      Extraocular Movements: Extraocular movements intact.      Conjunctiva/sclera: Conjunctivae normal.      Pupils: Pupils are equal, round, and reactive to light.   Cardiovascular:  Rate and Rhythm: Regular rhythm. Tachycardia present.      Pulses: Normal pulses.      Heart sounds: Normal heart sounds.   Pulmonary:      Effort: Pulmonary effort is normal.      Breath sounds: Normal breath sounds.   Abdominal:      General: Bowel sounds are normal.      Palpations: Abdomen is soft.      Tenderness: There is no abdominal tenderness.   Musculoskeletal:         General: Normal range of  motion.      Cervical back: Normal range of motion and neck supple.   Skin:     General: Skin is warm and dry.      Capillary Refill: Capillary refill takes less than 2 seconds.   Neurological:      General: No focal deficit present.      Mental Status: He is alert and oriented to person, place, and time.      GCS: GCS eye subscore is 4. GCS verbal subscore is 5. GCS motor subscore is 6.      Cranial Nerves: Cranial nerves 2-12 are intact.      Sensory: Sensation is intact.      Motor: Motor function is intact.      Coordination: Coordination is intact.   Psychiatric:         Attention and Perception: Attention and perception normal.         Mood and Affect: Mood normal.         Speech: Speech normal.         Behavior: Behavior normal. Behavior is not agitated or hyperactive. Behavior is cooperative.         Thought Content: Thought content is not paranoid or delusional. Thought content does not include homicidal ideation. Thought content does not include homicidal or suicidal plan.         Cognition and Memory: Cognition and memory normal.         Judgment: Judgment normal.         SCREENINGS              LAB, EKG AND DIAGNOSTIC RESULTS   Labs:  No results found for this or any previous visit (from the past 12 hours).    EKG: Not Applicable     Radiologic Studies:  Non-plain film images such as CT, Ultrasound and MRI are read by the radiologist. Plain radiographic images are visualized and preliminarily interpreted by the ED Physician with the following findings: Not Applicable.    Interpretation per the Radiologist below, if available at the time of this note:  No orders to display        ED COURSE and DIFFERENTIAL DIAGNOSIS/MDM   CC/HPI Summary, DDx, ED Course, and Reassessment: 38 year old male presents with complaint of 4 days of increased flashbacks from [employer], battle images, patient denies homicidal/suicidal ideations,    Records Reviewed (source and summary of external notes): Prior medical  records and Nursing notes    Vitals:    Vitals:    07/12/24 1703   BP: (!) 160/108   Pulse: (!) 121   Resp: 18   Temp: 97.1 F (36.2 C)   SpO2: 98%   Weight: 108.9 kg (240 lb)   Height: 1.829 m (6')        ED COURSE    Telepsych consult  Nifedipine  for blood pressure management    ED Course as of 07/18/24  2232   Fri Jul 12, 2024   1716 Receiving telepsych consult at this time [SB]   1723 Pulse(!): 121 [SB]   1723 BP(!): 160/108 [SB]   1743 Telepsych consult recommends increasing prazosin  to 3 mg nightly [SB]      ED Course User Index  [SB] Benn-Thompson, Baldwin Racicot, MD     Disposition Considerations (Tests not done, Shared Decision Making, Pt Expectation of Test or Treatment.): 38 year old male presenting with complaint of exacerbation of PE TSD from [employer] service, patient complaining of having flashbacks and nightmares, patient denies suicidal/homicidal ED Asians, patient vital signs reveal tachycardia and hypertension with patient a bit anxious, patient received telepsych consult with recommendations for follow-up with local psychiatrist and patient may be discharged    Patient was given the following medications:  Medications - No data to display    CONSULTS: (Who and What was discussed)  IP CONSULT TO TELE-PSYCH (SOCIAL WORK ONLY)     Social Determinants affecting Dx or Tx: None    Smoking Cessation: Not Applicable    PROCEDURES   Unless otherwise noted above, none.  Procedures      CRITICAL CARE TIME   Patient does not meet Critical Care Time, 0 minutes    FINAL IMPRESSION   No diagnosis found.      DISPOSITION/PLAN   DISPOSITION               Discharge Note: The patient is stable for discharge. The signs, symptoms, diagnosis, and discharge instructions have been discussed, understanding conveyed, and agreed upon. The patient is to follow up as recommended or return to ER should their symptoms worsen.   FOLLOWUP: No follow-up provider specified. DISCHARGE MEDS:      Medication List        ASK your doctor about  these medications      prazosin  2 MG capsule  Commonly known as: MINIPRESS      QUEtiapine  50 MG tablet  Commonly known as: SEROQUEL      traZODone 50 MG tablet  Commonly known as: DESYREL                PATIENT REFERRED TO:  No follow-up provider specified.      DISCHARGE MEDICATIONS:     Medication List        ASK your doctor about these medications      prazosin  2 MG capsule  Commonly known as: MINIPRESS      QUEtiapine  50 MG tablet  Commonly known as: SEROQUEL      traZODone 50 MG tablet  Commonly known as: DESYREL                DISCONTINUED MEDICATIONS:  Current Discharge Medication List          I am the Primary Clinician of Record: Shaunna Gibes, MD (electronically signed)    (Please note that parts of this dictation were completed with voice recognition software. Quite often unanticipated grammatical, syntax, homophones, and other interpretive errors are inadvertently transcribed by the computer software. Please disregards these errors. Please excuse any errors that have escaped final proofreading.)     Gibes Shaunna, MD  07/18/24 2242    "

## 2024-07-12 NOTE — ED Notes (Signed)
"  Discharged home to self.  Ambulatory out of ED.  VS WDL.  0 s/s acute distress.  Respirations even and unlabored.  Discharge instructions and follow up care reviewed.  Patient receptive and demonstrated knowledge of instruction via teach-back method.    "

## 2024-07-12 NOTE — Virtual Health (Signed)
"  Received Telepsych Consult order.  Chart and other available information reviewed.  Related to case complexity, the patient is being escalated directly to Psychiatric NP on shift.      Reason for direct escalation:  Severe agitation, medication management     Order placed for escalation to NP.  Patient will be seen by next available NP-       Please reach out to Nurse Practitioner on-call for any acute needs related to this patient in the interim.        Dene ONEIDA Lesches, was evaluated through a synchronous (real-time) audio-video encounter. The patient (and/or guardian if applicable) is aware that this is a billable service, which includes applicable co-pays. This virtual visit was conducted with patient's (and/or legal guardian's) consent. Patient identification was verified, and a caregiver was present when appropriate.  The patient was located at Facility (Appt Department): [facility]  [facility] DEPART[facility]  [address] [place] [address]  Loc: [phone]  The provider was located at Facility [facility] Dept): [facility] TELEPSYCHIATRY PROGRAM  [address]  [address]  C/O [facility]  [place] [address]  [phone]  Confirm you are appropriately licensed, registered, or certified to deliver care in the state where the patient is located as indicated above. If you are not or unsure, please re-schedule the visit: Yes, I confirm.   [place] Consult to [employer] performed by: Thos Matsumoto, LCSW  Consult ordered by: Callie Sous, MD  Reason for consult: Medication Assistance           Total time spent on this encounter: Not billed by time    --Brycen Bean, LCSW on 07/12/2024 at 5:08 PM    An electronic signature was used to authenticate this note.    "

## 2024-07-12 NOTE — Discharge Instructions (Addendum)
"  Increase prazosin  to 3 mg at night  "

## 2024-07-12 NOTE — ED Triage Notes (Signed)
"  Pt arrives under voluntary status for PTSD exacerbation x1 week. Pt denies si/hi. Pt requests psychiatric evaluation for medication assistance.   "

## 2024-07-24 ENCOUNTER — Inpatient Hospital Stay
Admission: EM | Admit: 2024-07-24 | Discharge: 2024-07-26 | Disposition: A | Discharge status: Home / Self Care | Payer: MEDICAID

## 2024-07-24 ENCOUNTER — Emergency Department: Admit: 2024-07-24 | Payer: MEDICAID

## 2024-07-24 ENCOUNTER — Inpatient Hospital Stay
Admit: 2024-07-24 | Discharge: 2024-07-24 | Disposition: A | Discharge status: Still In Hospital | Payer: MEDICAID | Arrived: WI | Attending: Emergency Medicine

## 2024-07-24 DIAGNOSIS — I6330 Cerebral infarction due to thrombosis of unspecified cerebral artery: Principal | ICD-10-CM

## 2024-07-24 DIAGNOSIS — I639 Cerebral infarction, unspecified: Secondary | ICD-10-CM

## 2024-07-24 LAB — CBC WITH AUTO DIFFERENTIAL
Basophils %: 0.6 % (ref 0.0–1.0)
Basophils Absolute: 0.04 K/UL (ref 0.00–0.10)
Eosinophils %: 2.1 % (ref 0.0–7.0)
Eosinophils Absolute: 0.15 K/UL (ref 0.00–0.40)
Hematocrit: 42.8 % (ref 36.6–50.3)
Hemoglobin: 14.8 g/dL (ref 12.1–17.0)
Immature Granulocytes %: 0.3 % (ref 0–0.5)
Immature Granulocytes Absolute: 0.02 K/UL (ref 0.00–0.04)
Lymphocytes %: 24.8 % (ref 12.0–49.0)
Lymphocytes Absolute: 1.8 K/UL (ref 0.80–3.50)
MCH: 31.9 pg (ref 26.0–34.0)
MCHC: 34.6 g/dL (ref 30.0–36.5)
MCV: 92.2 FL (ref 80.0–99.0)
MPV: 9.6 FL (ref 8.9–12.9)
Monocytes %: 8.1 % (ref 5.0–13.0)
Monocytes Absolute: 0.59 K/UL (ref 0.00–1.00)
Neutrophils %: 64.1 % (ref 32.0–75.0)
Neutrophils Absolute: 4.65 K/UL (ref 1.80–8.00)
Nucleated RBCs: 0 /100{WBCs}
Platelets: 271 K/uL (ref 150–400)
RBC: 4.64 M/uL (ref 4.10–5.70)
RDW: 13.4 % (ref 11.5–14.5)
WBC: 7.3 K/uL (ref 4.1–11.1)
nRBC: 0 K/uL (ref 0.00–0.01)

## 2024-07-24 LAB — EKG 12-LEAD
Atrial Rate: 126 {beats}/min
P Axis: 55 degrees
P-R Interval: 142 ms
Q-T Interval: 299 ms
QRS Duration: 104 ms
QTc Calculation (Bazett): 433 ms
R Axis: 72 degrees
T Axis: 40 degrees
Ventricular Rate: 126 {beats}/min

## 2024-07-24 LAB — COMPREHENSIVE METABOLIC PANEL
ALT: 36 U/L (ref 10–50)
AST: 28 U/L (ref 10–50)
Albumin/Globulin Ratio: 1.2 (ref 1.1–2.2)
Albumin: 4.4 g/dL (ref 3.5–5.2)
Alk Phosphatase: 101 U/L (ref 40–129)
Anion Gap: 13 mmol/L (ref 2–14)
BUN/Creatinine Ratio: 14 (ref 12–20)
BUN: 15 mg/dL (ref 6–20)
CO2: 21 mmol/L (ref 20–29)
Calcium: 10.4 mg/dL — ABNORMAL HIGH (ref 8.6–10.0)
Chloride: 107 mmol/L (ref 98–107)
Creatinine: 1.09 mg/dL (ref 0.70–1.20)
Est, Glom Filt Rate: 89 ml/min/1.73m2 (ref >59)
Globulin: 3.8 g/dL (ref 2.0–4.0)
Glucose: 95 mg/dL (ref 65–100)
Potassium: 4 mmol/L (ref 3.5–5.1)
Sodium: 141 mmol/L (ref 136–145)
Total Bilirubin: 0.6 mg/dL (ref 0.0–1.2)
Total Protein: 8.2 g/dL (ref 6.4–8.3)

## 2024-07-24 LAB — URINALYSIS WITH REFLEX TO CULTURE
BACTERIA, URINE: NEGATIVE /HPF
Bilirubin, Urine: NEGATIVE
Blood, Urine: NEGATIVE
Glucose, Ur: NEGATIVE mg/dL
Ketones, Urine: NEGATIVE mg/dL
Leukocyte Esterase, Urine: NEGATIVE
Nitrite, Urine: NEGATIVE
Protein, UA: NEGATIVE mg/dL
Specific Gravity, UA: 1.02 (ref 1.003–1.030)
Urobilinogen, Urine: 0.2 EU/dL (ref 0.2–1.0)
pH, Urine: 5.5 (ref 5.0–8.0)

## 2024-07-24 LAB — TROPONIN: Troponin T: <6 ng/L (ref 0–22)

## 2024-07-24 LAB — POCT GLUCOSE: POC Glucose: 102 mg/dL — ABNORMAL HIGH (ref 65–100)

## 2024-07-24 LAB — MAGNESIUM: Magnesium: 2 mg/dL (ref 1.6–2.6)

## 2024-07-24 LAB — PROTIME-INR
INR: 1 (ref 0.9–1.1)
Protime: 12.9 s (ref 11.9–14.1)

## 2024-07-24 MED ORDER — MAGNESIUM SULFATE 2000 MG/50 ML IVPB PREMIX
2 | INTRAVENOUS | Status: DC | PRN
Start: 2024-07-24 — End: 2024-07-26

## 2024-07-24 MED ORDER — ENOXAPARIN SODIUM 40 MG/0.4ML IJ SOSY
40 | Freq: Every day | INTRAMUSCULAR | Status: DC
Start: 2024-07-24 — End: 2024-07-25
  Administered 2024-07-25: 22:00:00 40 mg via SUBCUTANEOUS

## 2024-07-24 MED ORDER — LABETALOL HCL 5 MG/ML IV SOLN
5 | INTRAVENOUS | Status: DC | PRN
Start: 2024-07-24 — End: 2024-07-26

## 2024-07-24 MED ORDER — ASPIRIN 300 MG RE SUPP
300 | Freq: Every day | RECTAL | Status: DC
Start: 2024-07-24 — End: 2024-07-26

## 2024-07-24 MED ORDER — APIXABAN 5 MG PO TABS
5 | Freq: Two times a day (BID) | ORAL | Status: DC
Start: 2024-07-24 — End: 2024-07-26
  Administered 2024-07-26: 14:00:00 5 mg via ORAL

## 2024-07-24 MED ORDER — QUETIAPINE FUMARATE 25 MG PO TABS
25 | Freq: Two times a day (BID) | ORAL | Status: DC
Start: 2024-07-24 — End: 2024-07-26
  Administered 2024-07-25 – 2024-07-26 (×3): 50 mg via ORAL

## 2024-07-24 MED ORDER — ASPIRIN 81 MG PO CHEW
81 | Freq: Every day | ORAL | Status: DC
Start: 2024-07-24 — End: 2024-07-26
  Administered 2024-07-25 – 2024-07-26 (×2): 81 mg via ORAL

## 2024-07-24 MED ORDER — TENECTEPLASE 50 MG IV KIT
50 | Freq: Once | INTRAVENOUS | Status: AC
Start: 2024-07-24 — End: 2024-07-24
  Administered 2024-07-24: 18:00:00 25 mg via INTRAVENOUS

## 2024-07-24 MED ORDER — NORMAL SALINE FLUSH 0.9 % IV SOLN
0.9 | Freq: Once | INTRAVENOUS | Status: DC
Start: 2024-07-24 — End: 2024-07-24

## 2024-07-24 MED ORDER — POTASSIUM BICARB-CITRIC ACID 20 MEQ PO TBEF
20 | ORAL | Status: DC | PRN
Start: 2024-07-24 — End: 2024-07-26

## 2024-07-24 MED ORDER — POTASSIUM CHLORIDE CRYS ER 20 MEQ PO TBCR
20 | ORAL | Status: DC | PRN
Start: 2024-07-24 — End: 2024-07-26

## 2024-07-24 MED ORDER — LABETALOL HCL 5 MG/ML IV SOLN
5 | INTRAVENOUS | Status: AC
Start: 2024-07-24 — End: 2024-07-24
  Administered 2024-07-24: 18:00:00 5 mg via INTRAVENOUS

## 2024-07-24 MED ORDER — POTASSIUM CHLORIDE 10 MEQ/100ML IV SOLN
10 | INTRAVENOUS | Status: DC | PRN
Start: 2024-07-24 — End: 2024-07-26

## 2024-07-24 MED ORDER — IOPAMIDOL 76 % IV SOLN
76 | Freq: Once | INTRAVENOUS | Status: AC | PRN
Start: 2024-07-24 — End: 2024-07-24
  Administered 2024-07-24: 18:00:00 100 mL via INTRAVENOUS

## 2024-07-24 MED ORDER — FENTANYL CITRATE (PF) 100 MCG/2ML IJ SOLN
100 | INTRAMUSCULAR | Status: DC | PRN
Start: 2024-07-24 — End: 2024-07-24
  Administered 2024-07-24 – 2024-07-25 (×2): 50 ug via INTRAVENOUS

## 2024-07-24 MED ORDER — KETOROLAC TROMETHAMINE 30 MG/ML IJ SOLN
30 | INTRAMUSCULAR | Status: AC
Start: 2024-07-24 — End: 2024-07-24
  Administered 2024-07-24: 18:00:00 30 mg via INTRAVENOUS

## 2024-07-24 MED ORDER — NORMAL SALINE FLUSH 0.9 % IV SOLN
0.9 | Freq: Two times a day (BID) | INTRAVENOUS | Status: DC
Start: 2024-07-24 — End: 2024-07-26
  Administered 2024-07-25 – 2024-07-26 (×3): 10 mL via INTRAVENOUS

## 2024-07-24 MED ORDER — NORMAL SALINE FLUSH 0.9 % IV SOLN
0.9 | Freq: Once | INTRAVENOUS | Status: AC
Start: 2024-07-24 — End: 2024-07-24
  Administered 2024-07-24: 18:00:00 10 mL via INTRAVENOUS

## 2024-07-24 MED ORDER — LISINOPRIL 10 MG PO TABS
10 | Freq: Every day | ORAL | Status: DC
Start: 2024-07-24 — End: 2024-07-26

## 2024-07-24 MED ORDER — ONDANSETRON HCL 4 MG/2ML IJ SOLN
4 | Freq: Four times a day (QID) | INTRAMUSCULAR | Status: DC | PRN
Start: 2024-07-24 — End: 2024-07-26

## 2024-07-24 MED ORDER — CLONIDINE HCL 0.1 MG PO TABS
0.1 | Freq: Two times a day (BID) | ORAL | Status: DC
Start: 2024-07-24 — End: 2024-07-26
  Administered 2024-07-25 – 2024-07-26 (×4): 0.1 mg via ORAL

## 2024-07-24 MED ORDER — NORMAL SALINE FLUSH 0.9 % IV SOLN
0.9 | INTRAVENOUS | Status: DC | PRN
Start: 2024-07-24 — End: 2024-07-26

## 2024-07-24 MED ORDER — POLYETHYLENE GLYCOL 3350 17 G PO PACK
17 | Freq: Every day | ORAL | Status: DC | PRN
Start: 2024-07-24 — End: 2024-07-26

## 2024-07-24 MED ORDER — PRAZOSIN HCL 1 MG PO CAPS
1 | Freq: Every evening | ORAL | Status: DC
Start: 2024-07-24 — End: 2024-07-26
  Administered 2024-07-25 – 2024-07-26 (×2): 2 mg via ORAL

## 2024-07-24 MED ORDER — BUSPIRONE HCL 5 MG PO TABS
5 | Freq: Two times a day (BID) | ORAL | Status: DC
Start: 2024-07-24 — End: 2024-07-26
  Administered 2024-07-25 – 2024-07-26 (×4): 5 mg via ORAL

## 2024-07-24 MED ORDER — ONDANSETRON 4 MG PO TBDP
4 | Freq: Three times a day (TID) | ORAL | Status: DC | PRN
Start: 2024-07-24 — End: 2024-07-26

## 2024-07-24 MED ORDER — FAMOTIDINE (PF) 20 MG/2 ML IV SOLN
20 | Freq: Every day | INTRAVENOUS | Status: DC
Start: 2024-07-24 — End: 2024-07-26
  Administered 2024-07-24 – 2024-07-26 (×3): 20 mg via INTRAVENOUS

## 2024-07-24 MED ORDER — HYDRALAZINE HCL 20 MG/ML IJ SOLN
20 | INTRAMUSCULAR | Status: DC | PRN
Start: 2024-07-24 — End: 2024-07-26

## 2024-07-24 MED ORDER — SODIUM CHLORIDE 0.9 % IV BOLUS
0.9 | Freq: Once | INTRAVENOUS | Status: AC
Start: 2024-07-24 — End: 2024-07-24
  Administered 2024-07-24: 18:00:00 1000 mL via INTRAVENOUS

## 2024-07-24 MED ORDER — MORPHINE SULFATE (PF) 4 MG/ML IJ SOLN
4 | INTRAMUSCULAR | Status: AC
Start: 2024-07-24 — End: 2024-07-24
  Administered 2024-07-24: 19:00:00 4 mg via INTRAVENOUS

## 2024-07-24 MED ORDER — ATORVASTATIN CALCIUM 40 MG PO TABS
40 | Freq: Every evening | ORAL | Status: DC
Start: 2024-07-24 — End: 2024-07-26
  Administered 2024-07-25 – 2024-07-26 (×2): 80 mg via ORAL

## 2024-07-24 MED ORDER — SODIUM CHLORIDE 0.9 % IV SOLN
0.9 | INTRAVENOUS | Status: DC | PRN
Start: 2024-07-24 — End: 2024-07-26

## 2024-07-24 MED FILL — NORMAL SALINE FLUSH 0.9 % IV SOLN: 0.9 % | INTRAVENOUS | Qty: 10 | Fill #0

## 2024-07-24 MED FILL — LABETALOL HCL 5 MG/ML IV SOLN: 5 mg/mL | INTRAVENOUS | Qty: 4 | Fill #0

## 2024-07-24 MED FILL — SODIUM CHLORIDE FLUSH 0.9 % IV SOLN: 0.9 % | INTRAVENOUS | Qty: 40 | Fill #0

## 2024-07-24 MED FILL — SODIUM CHLORIDE 0.9 % IV SOLN: 0.9 % | INTRAVENOUS | Qty: 1000 | Fill #0

## 2024-07-24 MED FILL — LISINOPRIL 10 MG PO TABS: 10 mg | ORAL | Qty: 1 | Fill #0

## 2024-07-24 MED FILL — KETOROLAC TROMETHAMINE 30 MG/ML IJ SOLN: 30 mg/mL | INTRAMUSCULAR | Qty: 1 | Fill #0

## 2024-07-24 MED FILL — FENTANYL CITRATE (PF) 100 MCG/2ML IJ SOLN: 100 MCG/2ML | INTRAMUSCULAR | Qty: 2 | Fill #0

## 2024-07-24 MED FILL — MORPHINE SULFATE 4 MG/ML IJ SOLN: 4 mg/mL | INTRAMUSCULAR | Qty: 1 | Fill #0

## 2024-07-24 MED FILL — FAMOTIDINE (PF) 20 MG/2 ML IV SOLN: 20 MG/2ML | INTRAVENOUS | Qty: 2 | Fill #0

## 2024-07-24 MED FILL — ISOVUE-370 76 % IV SOLN: 76 % | INTRAVENOUS | Qty: 100 | Fill #0

## 2024-07-24 NOTE — ED Notes (Signed)
 Tele Neuro consulted.

## 2024-07-24 NOTE — ED Triage Notes (Signed)
"  Code stroke called. PT noted to have left sided facial droop with weakness to left upper and lower extremity. MD to bedside. Pt reports hx of afib and states he has been off of his eliquis  x 1 week. Reports symptoms began suddenly 30 mins PTA after having a sudden sharp pain is head    "

## 2024-07-24 NOTE — Plan of Care (Signed)
 "  Problem: Discharge Planning  Goal: Discharge to home or other facility with appropriate resources  Outcome: Progressing  Flowsheets (Taken 07/24/2024 1830)  Discharge to home or other facility with appropriate resources: Identify barriers to discharge with patient and caregiver     Problem: Chronic Conditions and Co-morbidities  Goal: Patient's chronic conditions and co-morbidity symptoms are monitored and maintained or improved  Outcome: Progressing  Flowsheets (Taken 07/24/2024 1830)  Care Plan - Patient's Chronic Conditions and Co-Morbidity Symptoms are Monitored and Maintained or Improved:   Monitor and assess patient's chronic conditions and comorbid symptoms for stability, deterioration, or improvement   Collaborate with multidisciplinary team to address chronic and comorbid conditions and prevent exacerbation or deterioration     Problem: ABCDS Injury Assessment  Goal: Absence of physical injury  Outcome: Progressing  Flowsheets (Taken 07/24/2024 1830)  Absence of Physical Injury: Implement safety measures based on patient assessment     Problem: Safety - Adult  Goal: Free from fall injury  Outcome: Progressing  Flowsheets (Taken 07/24/2024 1830)  Free From Fall Injury: Instruct family/caregiver on patient safety     Problem: Pain  Goal: Verbalizes/displays adequate comfort level or baseline comfort level  Outcome: Progressing  Flowsheets (Taken 07/24/2024 1830)  Verbalizes/displays adequate comfort level or baseline comfort level:   Encourage patient to monitor pain and request assistance   Assess pain using appropriate pain scale   Implement non-pharmacological measures as appropriate and evaluate response   Administer analgesics based on type and severity of pain and evaluate response     Problem: Neurosensory - Adult  Goal: Achieves stable or improved neurological status  Outcome: Progressing  Flowsheets (Taken 07/24/2024 1830)  Achieves stable or improved neurological status: Assess for and report  changes in neurological status     Problem: Cardiovascular - Adult  Goal: Maintains optimal cardiac output and hemodynamic stability  Outcome: Progressing  Flowsheets (Taken 07/24/2024 1830)  Maintains optimal cardiac output and hemodynamic stability:   Monitor blood pressure and heart rate   Monitor urine output and notify Licensed Independent Practitioner for values outside of normal range     Problem: Skin/Tissue Integrity - Adult  Goal: Skin integrity remains intact  Outcome: Progressing  Flowsheets (Taken 07/24/2024 1830)  Skin Integrity Remains Intact:   Monitor for areas of redness and/or skin breakdown   Assess vascular access sites hourly   Turn and reposition as indicated   Monitor skin under medical devices   Check visual cues for pain     Problem: Musculoskeletal - Adult  Goal: Return mobility to safest level of function  Outcome: Progressing  Flowsheets (Taken 07/24/2024 1830)  Return Mobility to Safest Level of Function:   Assess patient stability and activity tolerance for standing, transferring and ambulating with or without assistive devices   Assist with transfers and ambulation using safe patient handling equipment as needed     Problem: Gastrointestinal - Adult  Goal: Minimal or absence of nausea and vomiting  Outcome: Progressing  Flowsheets (Taken 07/24/2024 1830)  Minimal or absence of nausea and vomiting: Advance diet as tolerated, if ordered     Problem: Infection - Adult  Goal: Absence of infection at discharge  Outcome: Progressing  Flowsheets (Taken 07/24/2024 1830)  Absence of infection at discharge:   Assess and monitor for signs and symptoms of infection   Monitor lab/diagnostic results   Monitor all insertion sites i.e., indwelling lines, tubes and drains     Problem: Hematologic - Adult  Goal: Maintains hematologic stability  Outcome: Progressing  Flowsheets (Taken 07/24/2024 1830)  Maintains hematologic stability:   Assess for signs and symptoms of bleeding or hemorrhage   Monitor  labs for bleeding or clotting disorders     "

## 2024-07-24 NOTE — ED Notes (Signed)
"  In [place] - Environmental Education Officer with pt  "

## 2024-07-24 NOTE — H&P (Signed)
 "CRITICAL CARE ADMISSION NOTE    Name: Evan Woods   DOB: [birth year]   MRN: [id number]   Date: 07/24/2024      Diagnoses/problem list:   Ischemic stroke  Hypertension  Atrial fibrillation, paroxysmal    HPI   Evan Woods is a 38 year old male who presented to the ED with acute onset severe headache on the right side and progressive weakness and numbness on the left and dysarthria. He has a history of a TIA previously and knew to report to the ED as soon as possible. He also reports history of paroxysmal atrial fibrillation but stopped taking his eliquis  last week when he ran out of his prescription. He has additional past medical history of leukemia s/p bone marrow transplant, PTSD, hypertension, and MI in 2017. He reports his blood pressure has also been elevated the past several days.     ROS negative except as otherwise documented.    Assessment and plan:     NEUROLOGICAL:    Analgesia / sedation  Ischemic stroke s/p TNK (1305 administered)  PTSD  -Goal RASS -1  -CTH negative for acute findings and so TNK was administered with improvement in dysarthria and left sided weakness/numbness  -does take seroquel  and minipress  at home for PTSD  -received TNK at 1306  -repeat CTH and MRI ordered  -continue neurochecks per protocol    PULMONOLOGY:   No acute pulmonary concerns  - maintain SpO2>=92, pH>=7.30  - follow ABG and CXR    CARDIOVASCULAR:   Hypertension  -Goal MAP>=65  -Trend troponin / lactate PRN  -Check TTE  -EKG PRN  -labetalol  and hydralazine  PRN for BP control  -reports he takes lisinopril  and clonidine  for BP control    GASTROINTESTINAL:   Nutrition: CLD  -advance diet as tolerated  -SLP consulted  Bowel regimen  -Colace 100mg  po daily  -bisacodyl PRN  GI Px  -Pepcid  20mg       RENAL/ELECTROLYTE:   No acute renal concerns  - goal K>=4, Mg>=2, Phos >3  - daily BMP  - strict I/O's; daily weights  - avoid nephrotoxic medications    ENDOCRINE:   No known metabolic conditions  -glucose POCT  PRN    HEMATOLOGY/ONCOLOGY:   History of leukemia s/p BMT  VTE Px  -Goal Hb>=7  -Trend CBC,PTT/INR  -Enoxaparin  on hold, AC/AP post interval CTH  -SCDs    ID/MICRO:   No acute infectious concerns  -Trend fevers, WBC    ICU DAILY CHECKLIST     Code Status:full  DVT Prophylaxis:on hold  T/L/D: PIV  Diet: CLD  Activity Level: bedrest  ABCDEF Bundle/Checklist Completed: Yes  Disposition: Stay in ICU  Multidisciplinary Rounds Completed:  Pending    Past Medical History:      has a past medical history of Acute MI (HCC), Atrial fibrillation (HCC), Hypercholesteremia, Hypertension, Leukemia (HCC), and Pulmonary embolism (HCC).    Past Surgical History:      has a past surgical history that includes Appendectomy; Coronary angioplasty with stent; orthopedic surgery; and bone marrow transplant (2017).    Home Medications:     Prior to Admission medications   Medication Sig Start Date End Date Taking? Authorizing Provider   prazosin  (MINIPRESS ) 2 MG capsule Take 1 capsule by mouth nightly    [provider]   QUEtiapine  (SEROQUEL ) 50 MG tablet Take 2 tablets by mouth nightly 01/31/24   [provider]   traZODone (DESYREL) 50 MG tablet Take by mouth nightly  [provider]       Allergies/Social/Family History:     Allergies   Allergen Reactions    Ketamine     Penicillins       Social History     Tobacco Use    Smoking status: Every Day     Current packs/day: 0.25     Types: Cigarettes    Smokeless tobacco: Never   Substance Use Topics    Alcohol use: Never      History reviewed. No pertinent family history.      OBJECTIVE:     There were no vitals taken for this visit.  Physical Exam  Gen: Not in distress  HEENT: NC/AT, supple  Cardiac: Regular rate and rhythm, no murmurs  Pulm: Clear to auscultation bilaterally  ABD: Soft, non distended, non tender, normal bowel sounds  Extremities: no significant edema  Neuro: A/O X 3, left sided weakness and diminished sensation. Slight right sided facial  droop    Labs and Data: Reviewed 07/24/24  Medications: Reviewed 07/24/24  Imaging: Reviewed 07/24/24    Intake/Output:   No intake or output data in the 24 hours ending 07/24/24 1652    CRITICAL CARE DOCUMENTATION  I had a face to face encounter with the patient, reviewed and interpreted patient data including clinical events, labs, images, vital signs, I/O's, and examined patient.  I have discussed the case and the plan and management of the patient's care with the consulting services, the bedside nurses and the respiratory therapist.      NOTE OF PERSONAL INVOLVEMENT IN CARE   This patient has a high probability of imminent, clinically significant deterioration, which requires the highest level of preparedness to intervene urgently. I participated in the decision-making and personally managed or directed the management of the following life and organ supporting interventions that required my frequent assessment to treat or prevent imminent deterioration.    I personally spent 45 minutes of critical care time.  This is time spent at this critically ill patient's bedside actively involved in patient care as well as the coordination of care.  This does not include any procedural time which has been billed separately.    Mallie Sage, MD  Critical Care Medicine  [facility]    "

## 2024-07-24 NOTE — ED Provider Notes (Signed)
 "[facility]Y DEPT  EMERGENCY DEPARTMENT HISTORY AND PHYSICAL EXAM      Date: 07/24/2024  Patient Name: Evan Woods  MRN: [id number]  Birthdate: 05-Dec-1985  Date of evaluation: 07/24/2024  Provider: Shaunna Gibes, MD   Note Started: 12:32 PM EST 07/24/24    HISTORY OF PRESENT ILLNESS     Chief Complaint   Patient presents with    Stroke like symptoms       History Provided By: Patient    HPI: Evan Woods is a 38 y.o. male 07/24/2024 12:34 PM  Signed  Code stroke called. PT noted to have left sided facial droop with weakness to left upper and lower extremity. MD to bedside. Pt reports hx of afib and states he has been off of his eliquis  x 1 week. Reports symptoms began suddenly 30 mins PTA after having a sudden sharp pain is head            PAST MEDICAL HISTORY   Past Medical History:  Past Medical History:   Diagnosis Date    Acute MI (HCC)     Atrial fibrillation (HCC)     Hypercholesteremia     Hypertension     Leukemia (HCC)     Pulmonary embolism (HCC) 2018       Past Surgical History:  Past Surgical History:   Procedure Laterality Date    APPENDECTOMY      BONE MARROW TRANSPLANT  2017    CORONARY ANGIOPLASTY WITH STENT PLACEMENT      2019, 2015 x 2    ORTHOPEDIC SURGERY      shoulder surgery       Family History:  No family history on file.    Social History:  Social History     Tobacco Use    Smoking status: Every Day     Current packs/day: 0.25     Types: Cigarettes    Smokeless tobacco: Never   Substance Use Topics    Alcohol use: Never    Drug use: No       Allergies:  Allergies   Allergen Reactions    Ketamine     Penicillins        PCP: Myra Locus, MD    Current Meds:   No current facility-administered medications for this encounter.     Current Outpatient Medications   Medication Sig Dispense Refill    prazosin  (MINIPRESS ) 2 MG capsule Take 1 capsule by mouth nightly      QUEtiapine  (SEROQUEL ) 50 MG tablet Take 2 tablets by mouth nightly      traZODone (DESYREL) 50 MG tablet Take by  mouth nightly         Social Determinants of Health:   Social Drivers of Health     Tobacco Use: High Risk (05/31/2024)    Patient History     Smoking Tobacco Use: Every Day     Smokeless Tobacco Use: Never     Passive Exposure: Not on file   Alcohol Use: Not At Risk (07/12/2024)    AUDIT-C     Frequency of Alcohol Consumption: Never     Average Number of Drinks: Patient does not drink     Frequency of Binge Drinking: Never   Financial Resource Strain: Medium Risk (09/09/2023)    Received from [facility]    Overall Financial Resource Strain (CARDIA)     Difficulty of Paying Living Expenses: Somewhat hard   Food Insecurity: High Risk (02/12/2024)  Received from [facility]    Food Insecurity     Within the past 12 months, did the food you bought just not last and you didn't have money to get more?: Yes     Within the past 12 months, did you worry that your food would run out before you got money to buy more?: No   Transportation Needs: Low Risk (02/12/2024)    Received from [facility]    Transportation Needs     Within the past 12 months, has a lack of transportation kept you from medical appointments or from doing things needed for daily living?: No   Physical Activity: Not on file   Stress: Not on file   Social Connections: Low Risk  (08/18/2022)    Received from [employer] [facility] ([place], [place], [place], [place], [place])    Family and [employer]     Help with Day to Day Activities: Not on file     Feeling Lonely or Isolated: Not on file   Intimate Partner Violence: Not At Risk (12/05/2023)    Received from [facility]    HITS     Over the last 12 months how often did your partner physically hurt you?: Never     Over the last 12 months how often did your partner insult you or talk down to you?: Never     Over the last 12 months how often did your partner threaten you with physical harm?: Never     Over the last 12 months how often did your partner scream or curse at you?: Never    Depression: Not at risk (09/11/2023)    Received from [facility]    PHQ-2     Patient Health Questionnaire-2 Score: 0   Housing Stability: Low Risk (02/12/2024)    Received from [facility]    Housing Stability     Within the past 12 months, have you ever stayed: outside, in a car, in a tent, in an overnight shelter, or temporarily in someone else's home (i.e. couch-surfing)?: No     Are you worried about losing your housing? : No   Interpersonal Safety: Not At Risk (07/12/2024)    Interpersonal Safety Domain Source: IP Abuse Screening     Physical abuse: Denies     Verbal abuse: Denies     Emotional abuse: Denies     Financial abuse: Denies     Sexual abuse: Denies   Utilities: High Risk (02/12/2024)    Received from [facility]    Utilities     Within the past 12 months, have you been unable to get utilities (heat, electricity) when it was really needed?: Yes       PHYSICAL EXAM   Physical Exam  Vitals and nursing note reviewed.   Constitutional:       General: He is not in acute distress.     Appearance: Normal appearance. He is normal weight. He is not ill-appearing, toxic-appearing or diaphoretic.   HENT:      Head: Normocephalic and atraumatic.      Nose: Nose normal.      Mouth/Throat:      Mouth: Mucous membranes are moist.      Pharynx: No oropharyngeal exudate.      Comments: Left facial droop, tongue midline  Eyes:      General: No visual field deficit.     Extraocular Movements: Extraocular movements intact.  Conjunctiva/sclera: Conjunctivae normal.      Pupils: Pupils are equal, round, and reactive to light.   Cardiovascular:      Rate and Rhythm: Normal rate and regular rhythm.      Pulses: Normal pulses.      Heart sounds: Normal heart sounds.   Pulmonary:      Effort: Pulmonary effort is normal.      Breath sounds: Normal breath sounds.   Abdominal:      General: Bowel sounds are normal.      Palpations: Abdomen is soft.      Tenderness: There is no  abdominal tenderness.   Musculoskeletal:         General: Normal range of motion.      Cervical back: Normal range of motion and neck supple.   Skin:     General: Skin is warm and dry.      Capillary Refill: Capillary refill takes less than 2 seconds.   Neurological:      General: No focal deficit present.      Mental Status: He is alert and oriented to person, place, and time.      Cranial Nerves: Cranial nerve deficit, dysarthria and facial asymmetry present.      Sensory: Sensory deficit present.      Motor: Weakness present.   Psychiatric:         Attention and Perception: Attention and perception normal.         Mood and Affect: Mood normal.         Behavior: Behavior normal.           SCREENINGS     NIH Stroke Scale  Interval: Baseline  Level of Consciousness (1a): Alert  LOC Questions (1b): Answers both correctly  LOC Commands (1c): Performs both tasks correctly  Best Gaze (2): Normal  Visual (3): No visual loss  Facial Palsy (4): (!) Complete paralysis of one or both sides  Motor Arm, Left (5a): No movement  Motor Arm, Right (5b): No drift  Motor Leg, Left (6a): Some effort against gravity        LAB, EKG AND DIAGNOSTIC RESULTS   Labs:  No results found for this or any previous visit (from the past 12 hours).    EKG: EKG interpreted by me. Shows Sinus Tachycardia with a HR of 124. No STEMI.    Radiologic Studies:  Non-plain film images such as CT, Ultrasound and MRI are read by the radiologist. Plain radiographic images are visualized and preliminarily interpreted by the ED Physician with the following findings: CT Interpreted by me.  Shows CT head negative for any acute infarcts no hemorrhage, no LVO    Interpretation per the Radiologist below, if available at the time of this note:  No orders to display        ED COURSE and DIFFERENTIAL DIAGNOSIS/MDM   CC/HPI Summary, DDx, ED Course, and Reassessment: 38 year old male reports 30 minutes prior to arrival he experiencing weakness in his left arm and  difficulty speaking, patient brought to the hospital by his sister, patient reports he has not taken his Eliquis  for 1 week, past medical history significant for atrial fibrillation    Records Reviewed (source and summary of external notes): Prior medical records and Nursing notes    Vitals:    Vitals:    07/24/24 1228   Weight: 106.6 kg (235 lb)   Height: 1.829 m (6')        ED COURSE  Code stroke activated  Teleneurology consult  TNK  Toradol  for headache  Morphine  for headache  Labetalol  for blood pressure management      ED Course as of 07/24/24 1413   Wed Jul 24, 2024   1231 1256 last known well [SB]   1308 Patient requesting something for pain, Toradol  administered, patient did not pass swallowing screen, [SB]   1403 Patient accepted to [facility] ICU by Dr. Aveen [SB]      ED Course User Index  [SB] Callie Sous, MD     Disposition Considerations (Tests not done, Shared Decision Making, Pt Expectation of Test or Treatment.): 38 year old male last known well 1156 while shopping in [place] began to experience right sided head ache and then had left-sided arm and leg weakness and found the difficult to speak, patient brought to the hospital by his sister, patient presented left facial droop difficulty speaking, decreased sensation and weakness to left upper and lower extremities, patient received teleneurology consult, CT head and CTA head and neck both studies negative for any acute infarct no LVO, Dr. Kimberly recommended TNK, patient received maximum dose of 25 mg TNK IV push, patient experiencing improvement in symptoms speech improved, lessening of left facial droop, increase strength to upper and lower extremities, findings reassuring, patient to be transferred to a facility for higher level of care and ICU monitoring    Patient was given the following medications:  Medications - No data to display    CONSULTS: (Who and What was discussed)  IP CONSULT TO TELE-NEUROLOGY     Social  Determinants affecting Dx or Tx: None    Smoking Cessation: Not Applicable    PROCEDURES   Unless otherwise noted above, none.  Procedures      CRITICAL CARE TIME   Critical Care Time: There was a high probability of life-threatening clinical deterioration in the patient's condition requiring my urgent intervention.  I personally saw the patient and independently provided 68 minutes of non-concurrent critical care out of the total shared critical care time provided, excluding separately reportable procedures.     FINAL IMPRESSION   No diagnosis found.      DISPOSITION/PLAN   DISPOSITION               Transfer: The patient is being transferred to [facility] ICU. The results of their tests and reasons for their transfer have been discussed with the patient and/or available family. The patient/family has conveyed agreement and understanding for the need to be transferred and for their diagnosis. Consultation has been made with Dr. Aveen, who agrees to accept the transfer.      PATIENT REFERRED TO:  No follow-up provider specified.      DISCHARGE MEDICATIONS:     Medication List        ASK your doctor about these medications      prazosin  2 MG capsule  Commonly known as: MINIPRESS      QUEtiapine  50 MG tablet  Commonly known as: SEROQUEL      traZODone 50 MG tablet  Commonly known as: DESYREL                DISCONTINUED MEDICATIONS:  Current Discharge Medication List          I am the Primary Clinician of Record: Sous Callie, MD (electronically signed)    (Please note that parts of this dictation were completed with voice recognition software. Quite often unanticipated grammatical, syntax, homophones, and other interpretive errors are inadvertently  transcribed by the computer software. Please disregards these errors. Please excuse any errors that have escaped final proofreading.)     Callie Sous, MD  07/29/24 1041    "

## 2024-07-24 NOTE — ED Notes (Signed)
"  TRANSFER - OUT REPORT:    Verbal report given to Brianna Tomko RN(name) on Evan Woods  being transferred to [facility]) for routine progression of patient care       Report consisted of patients Situation, Background, Assessment and   Recommendations(SBAR).     Information from the following report(s) Nurse Handoff Report, ED Encounter Summary, ED SBAR, Adult Overview, Intake/Output, MAR, and Recent Results was reviewed with the receiving nurse.    Opportunity for questions and clarification was provided.      Patient transported with:   Monitor    Lines: #20 R AC Help Text      This SmartLink retrieves the last documented value for LDA assessment data, retrieved by either LDA type or by LDA group ID.     This SmartLink should be used in a SmartText or SmartPhrase. If one is not available, please designer, multimedia.         "

## 2024-07-24 NOTE — ED Notes (Signed)
"  Report to [facility] with [facility].  [facility] to bedside to assume care of patient.   "

## 2024-07-25 ENCOUNTER — Inpatient Hospital Stay: Admit: 2024-07-25 | Payer: MEDICAID

## 2024-07-25 LAB — BASIC METABOLIC PANEL
Anion Gap: 10 mmol/L (ref 2–14)
BUN/Creatinine Ratio: 17 (ref 12–20)
BUN: 17 mg/dL (ref 6–20)
CO2: 23 mmol/L (ref 20–29)
Calcium: 8.4 mg/dL — ABNORMAL LOW (ref 8.6–10.0)
Chloride: 106 mmol/L (ref 98–107)
Creatinine: 1 mg/dL (ref 0.70–1.20)
Est, Glom Filt Rate: >90 ml/min/1.73m2 (ref >59)
Glucose: 94 mg/dL (ref 65–100)
Potassium: 3.8 mmol/L (ref 3.5–5.1)
Sodium: 139 mmol/L (ref 136–145)

## 2024-07-25 LAB — CBC
Hematocrit: 37.6 % (ref 36.6–50.3)
Hemoglobin: 12.9 g/dL (ref 12.1–17.0)
MCH: 31.9 pg (ref 26.0–34.0)
MCHC: 34.3 g/dL (ref 30.0–36.5)
MCV: 93.1 FL (ref 80.0–99.0)
MPV: 9.6 FL (ref 8.9–12.9)
Nucleated RBCs: 0 /100{WBCs}
Platelets: 204 K/uL (ref 150–400)
RBC: 4.04 M/uL — ABNORMAL LOW (ref 4.10–5.70)
RDW: 13.6 % (ref 11.5–14.5)
WBC: 3.6 K/uL — ABNORMAL LOW (ref 4.1–11.1)
nRBC: 0 K/uL (ref 0.00–0.01)

## 2024-07-25 LAB — MAGNESIUM: Magnesium: 2.1 mg/dL (ref 1.6–2.6)

## 2024-07-25 LAB — HEMOGLOBIN A1C
Estimated Avg Glucose: 120 mg/dL
Hemoglobin A1C: 5.8 % — ABNORMAL HIGH (ref 4.0–5.6)

## 2024-07-25 LAB — LIPID PANEL
Chol/HDL Ratio: 4.8 (ref 0.0–5.0)
Cholesterol, Total: 137 mg/dL (ref 0–200)
HDL: 29 mg/dL — ABNORMAL LOW (ref 40–60)
LDL Cholesterol: 92 mg/dL (ref 0–100)
Triglycerides: 84 mg/dL (ref 0–150)
VLDL Cholesterol Calculated: 17 mg/dL

## 2024-07-25 LAB — PHOSPHORUS: Phosphorus: 3.8 mg/dL (ref 2.5–4.5)

## 2024-07-25 MED ORDER — SUMATRIPTAN SUCCINATE 6 MG/0.5ML SC SOLN
6 | Freq: Once | SUBCUTANEOUS | Status: AC
Start: 2024-07-25 — End: 2024-07-25
  Administered 2024-07-25: 18:00:00 6 mg via SUBCUTANEOUS

## 2024-07-25 MED ORDER — OXYCODONE HCL 5 MG PO TABS
5 | Freq: Four times a day (QID) | ORAL | Status: DC | PRN
Start: 2024-07-25 — End: 2024-07-26
  Administered 2024-07-25: 19:00:00 5 mg via ORAL

## 2024-07-25 MED ORDER — FENTANYL CITRATE (PF) 100 MCG/2ML IJ SOLN
100 | INTRAMUSCULAR | Status: DC | PRN
Start: 2024-07-25 — End: 2024-07-26
  Administered 2024-07-25 – 2024-07-26 (×3): 50 ug via INTRAVENOUS

## 2024-07-25 MED ORDER — OXYCODONE HCL 10 MG PO TABS
10 | Freq: Four times a day (QID) | ORAL | Status: DC | PRN
Start: 2024-07-25 — End: 2024-07-26

## 2024-07-25 MED FILL — SUMATRIPTAN SUCCINATE 6 MG/0.5ML SC SOLN: 6 MG/0.5ML | SUBCUTANEOUS | Qty: 0.5 | Fill #0

## 2024-07-25 MED FILL — LISINOPRIL 10 MG PO TABS: 10 mg | ORAL | Qty: 1 | Fill #0

## 2024-07-25 MED FILL — CLONIDINE HCL 0.1 MG PO TABS: 0.1 mg | ORAL | Qty: 1 | Fill #0

## 2024-07-25 MED FILL — PRAZOSIN HCL 1 MG PO CAPS: 1 mg | ORAL | Qty: 2 | Fill #0

## 2024-07-25 MED FILL — ATORVASTATIN CALCIUM 40 MG PO TABS: 40 mg | ORAL | Qty: 2 | Fill #0

## 2024-07-25 MED FILL — OXYCODONE HCL 5 MG PO TABS: 5 mg | ORAL | Qty: 1 | Fill #0

## 2024-07-25 MED FILL — FAMOTIDINE (PF) 20 MG/2 ML IV SOLN: 20 MG/2ML | INTRAVENOUS | Qty: 2 | Fill #0

## 2024-07-25 MED FILL — ASPIRIN LOW STRENGTH 81 MG PO CHEW: 81 mg | ORAL | Qty: 1 | Fill #0

## 2024-07-25 MED FILL — ENOXAPARIN SODIUM 40 MG/0.4ML IJ SOSY: 40 MG/0.4ML | INTRAMUSCULAR | Qty: 0.4 | Fill #0

## 2024-07-25 MED FILL — FENTANYL CITRATE (PF) 100 MCG/2ML IJ SOLN: 100 MCG/2ML | INTRAMUSCULAR | Qty: 2 | Fill #0

## 2024-07-25 MED FILL — BUSPIRONE HCL 5 MG PO TABS: 5 mg | ORAL | Qty: 1 | Fill #0

## 2024-07-25 MED FILL — QUETIAPINE FUMARATE 25 MG PO TABS: 25 mg | ORAL | Qty: 2 | Fill #0

## 2024-07-25 NOTE — Consults (Signed)
 "     [facility]          Chief Complaint/Admission Diagnosis: CVA (cerebral vascular accident) (HCC) [I63.9]  Ischemic stroke (HCC) [I63.9]     I have been asked to see this 38 y.o. male in neurological consultation by Dr. Fernande, Comer HERO, MD  to render advice and opinion regarding potential ischemic stroke     ?     Impression/Recommendations:   Possible acute small ischemia    He has many risk factors for stroke:  Atrial Fibrillation  Off anticoagulation  Hx MI  Hx TIA  HTN  HLD    Etiology: likely Cardioembolic    Plan:  Resume Eliquis  tonight at 21:00  Continue Atorvastatin  80 mg daily   Echo pending report but EF 55%  Telemonitor  Pending MRI  Permissive HTN (BP < 180/110)  S. Glu 140 - 180  PT/OT  NPO until he passes bedside swallow evaluation    Offered to give him headache cocktail but he r decline because it includes Toradol   Give Imitrex for headache without good effect - Please avoid any Imitrex in the future as it can cause vasoconstriction     Mancel HERO Drought, MD  Teleneurologist    HPI:   History was obtained from a review of the electronic record and from the patient.??   38 year old male with PMH leukemia s/p bone marrow transfer (not compliant on immune suppression), atrial fibrillation (supposed to be on Eliquis  but he ran out of it a week ago), HTN, HLD, MI, and TIA!! who presented to the ED on 12/24 afternoon with acute onset severe headache on the right side and numbness on the left along with dysarthria. He reports his blood pressure has also been elevated the past several days. The patient received TNK. His arm nymbness resolved immediately after receiving the medicine but his headache remains.  CTH was unremarkable for acute pathology. 24 hr CTH is unremarkable as well.  CTA shows no LVO or significant stenosis.  ?     Review of Symptoms:     A ten system review of constitutional, cardiovascular, respiratory, musculoskeletal, endocrine, skin, HEENT,  genitourinary, neurological, and psychiatric systems was obtained and is negative except as noted above in HPI.     ?    Neurological Examination:   BP (!) 91/41   Pulse 67   Temp 97.4 F (36.3 C) (Oral)   Resp 16   Ht 1.829 m (6' 0.01)   Wt 104.6 kg (230 lb 9.6 oz)   SpO2 96%   BMI 31.27 kg/m    Assisted by Norris, RN   Mental status:  Patient was Alert.  Speech was fluent and articulate. Mental status exam was grossly within normal limits.   Cranial nerves:   Pupils were equally reactive. EOMI.  There was no facial  weakness.   There was a good shrug.   Tongue protruded on the midline.   Motor:   There was no pronator drift.   Legs could be lifted off the bed for 5 seconds.   No abnormal movements.  Sensory:   Intact Sensation to LT in BUE and BLE and bilateral face  Coordination:   There was no dysmetria.  Gait:   Not tested due to fall concerns       ?        PMH:   Past Medical History:   Diagnosis Date    Acute MI (HCC)  Atrial fibrillation (HCC)     Hypercholesteremia     Hypertension     Leukemia (HCC)     Pulmonary embolism (HCC) 2018      PSH:   Past Surgical History:   Procedure Laterality Date    APPENDECTOMY      BONE MARROW TRANSPLANT  2017    CORONARY ANGIOPLASTY WITH STENT PLACEMENT      2019, 2015 x 2    ORTHOPEDIC SURGERY      shoulder surgery      Allergies:   Allergies   Allergen Reactions    Ketamine     Penicillins       FH: History reviewed. No pertinent family history.   SH:   Social History     Tobacco Use    Smoking status: Every Day     Current packs/day: 0.25     Types: Cigarettes    Smokeless tobacco: Never   Substance Use Topics    Alcohol use: Never    Drug use: No      Meds:   Current Facility-Administered Medications   Medication Dose Route Frequency Provider Last Rate Last Admin    SUMAtriptan (IMITREX) injection 6 mg  6 mg SubCUTAneous Once Fernande Comer HERO, MD        sodium chloride  flush 0.9 % injection 5-40 mL  5-40 mL IntraVENous 2 times per day Fernande Comer HERO, MD   10 mL at 07/25/24 0751    sodium chloride  flush 0.9 % injection 5-40 mL  5-40 mL IntraVENous PRN Fernande Comer HERO, MD        0.9 % sodium chloride  infusion   IntraVENous PRN Fernande Comer HERO, MD        potassium chloride  (KLOR-CON  M) extended release tablet 40 mEq  40 mEq Oral PRN Fernande Comer HERO, MD        Or    potassium bicarb-citric acid  (EFFER-K) effervescent tablet 40 mEq  40 mEq Oral PRN Fernande Comer HERO, MD        Or    potassium chloride  10 mEq/100 mL IVPB (Peripheral Line)  10 mEq IntraVENous PRN Fernande Comer HERO, MD        magnesium  sulfate 2000 mg in 50 mL IVPB premix  2,000 mg IntraVENous PRN Fernande Comer HERO, MD        ondansetron  (ZOFRAN -ODT) disintegrating tablet 4 mg  4 mg Oral Q8H PRN Fernande Comer HERO, MD        Or    ondansetron  (ZOFRAN ) injection 4 mg  4 mg IntraVENous Q6H PRN Klein, Katherine M, MD        polyethylene glycol (GLYCOLAX ) packet 17 g  17 g Oral Daily PRN Fernande Comer HERO, MD        enoxaparin  (LOVENOX ) injection 40 mg  40 mg SubCUTAneous Daily Fernande Comer HERO, MD        aspirin  chewable tablet 81 mg  81 mg Oral Daily Fernande Comer HERO, MD        Or    aspirin  suppository 300 mg  300 mg Rectal Daily Fernande Comer HERO, MD        labetalol  (NORMODYNE ;TRANDATE ) injection 10 mg  10 mg IntraVENous Q10 Min PRN Fernande Comer HERO, MD        hydrALAZINE  (APRESOLINE ) injection 10 mg  10 mg IntraVENous Q30 Min PRN Fernande Comer HERO, MD        atorvastatin  (LIPITOR ) tablet 80 mg  80 mg Oral Nightly Fernande Comer  M, MD   80 mg at 07/24/24 2048    famotidine  (PEPCID ) 20 MG/2ML 20 mg in sodium chloride  (PF) 0.9 % 10 mL injection  20 mg IntraVENous Daily Fernande Comer HERO, MD   20 mg at 07/25/24 0751    [Held by provider] apixaban  (ELIQUIS ) tablet 5 mg  5 mg Oral BID Fernande Comer HERO, MD        busPIRone  (BUSPAR ) tablet 5 mg  5 mg Oral BID Fernande Comer HERO, MD   5 mg at 07/25/24 9249    cloNIDine  (CATAPRES ) tablet 0.1 mg  0.1 mg Oral BID Fernande Comer HERO,  MD   0.1 mg at 07/25/24 9248    [Held by provider] lisinopril  (PRINIVIL ;ZESTRIL ) tablet 10 mg  10 mg Oral Daily Fernande Comer HERO, MD        prazosin  (MINIPRESS ) capsule 2 mg  2 mg Oral Nightly Fernande Comer HERO, MD   2 mg at 07/24/24 2051    QUEtiapine  (SEROQUEL ) tablet 50 mg  50 mg Oral BID Fernande Comer HERO, MD   50 mg at 07/25/24 0750    oxyCODONE  (ROXICODONE ) immediate release tablet 5 mg  5 mg Oral Q6H PRN Marvis Lynwood HERO, APRN - CNP        oxyCODONE  HCl (OXY-IR) immediate release tablet 10 mg  10 mg Oral Q6H PRN Marvis Lynwood HERO, APRN - CNP        fentaNYL  (SUBLIMAZE ) injection 50 mcg  50 mcg IntraVENous Q2H PRN Marvis Lynwood HERO, APRN - CNP   50 mcg at 07/25/24 9248            Labs:     Recent Results (from the past 24 hours)   POCT Glucose    Collection Time: 07/24/24 12:54 PM   Result Value Ref Range    POC Glucose 102 (H) 65 - 100 mg/dL    Performed by: PERRI AMBER    Urinalysis with Reflex to Culture    Collection Time: 07/24/24  3:39 PM    Specimen: Urine   Result Value Ref Range    Color, UA Yellow/Straw      Appearance Clear Clear      Specific Gravity, UA 1.020 1.003 - 1.030      pH, Urine 5.5 5.0 - 8.0      Protein, UA Negative Negative mg/dL    Glucose, Ur Negative Negative mg/dL    Ketones, Urine Negative Negative mg/dL    Bilirubin, Urine Negative Negative      Blood, Urine Negative Negative      Urobilinogen, Urine 0.2 0.2 - 1.0 EU/dL    Nitrite, Urine Negative Negative      Leukocyte Esterase, Urine Negative Negative      WBC, UA 0-4 0 - 5 /hpf    RBC, UA 0-5 0 - 3 /hpf    BACTERIA, URINE Negative Negative /hpf    Urine Culture if Indicated Culture not indicated by UA result Culture not indicated by UA result     CBC    Collection Time: 07/25/24  2:55 AM   Result Value Ref Range    WBC 3.6 (L) 4.1 - 11.1 K/uL    RBC 4.04 (L) 4.10 - 5.70 M/uL    Hemoglobin 12.9 12.1 - 17.0 g/dL    Hematocrit 62.3 63.3 - 50.3 %    MCV 93.1 80.0 - 99.0 FL    MCH 31.9 26.0 - 34.0 PG    MCHC 34.3 30.0 - 36.5 g/dL  RDW  13.6 11.5 - 14.5 %    Platelets 204 150 - 400 K/uL    MPV 9.6 8.9 - 12.9 FL    Nucleated RBCs 0.0 0.0 PER 100 WBC    nRBC 0.00 0.00 - 0.01 K/uL   Basic Metabolic Panel    Collection Time: 07/25/24  2:55 AM   Result Value Ref Range    Sodium 139 136 - 145 mmol/L    Potassium 3.8 3.5 - 5.1 mmol/L    Chloride 106 98 - 107 mmol/L    CO2 23 20 - 29 mmol/L    Anion Gap 10 2 - 14 mmol/L    Glucose 94 65 - 100 mg/dL    BUN 17 6 - 20 mg/dL    Creatinine 8.99 9.29 - 1.20 mg/dL    BUN/Creatinine Ratio 17 12 - 20      Est, Glom Filt Rate >90 >59 ml/min/1.95m2    Calcium  8.4 (L) 8.6 - 10.0 mg/dL   Magnesium     Collection Time: 07/25/24  2:55 AM   Result Value Ref Range    Magnesium  2.1 1.6 - 2.6 mg/dL   Phosphorus    Collection Time: 07/25/24  2:55 AM   Result Value Ref Range    Phosphorus 3.8 2.5 - 4.5 mg/dL           Imaging:       CT head without contrast  Result Date: 07/25/2024  EXAM: CT HEAD WO CONTRAST INDICATION: 24 hours post thrombolytic agent COMPARISON: CTA head and neck 07/24/2024, CT head 07/24/2024. CONTRAST: None. TECHNIQUE: Unenhanced CT of the head was performed using 5 mm images. Brain and bone windows were generated. Coronal and sagittal reformats. CT dose reduction was achieved through use of a standardized protocol tailored for this examination and automatic exposure control for dose modulation.  FINDINGS: The ventricles and sulci are normal in size, shape and configuration. There is no significant white matter disease. There is no intracranial hemorrhage, extra-axial collection, or mass effect. The basilar cisterns are open. No CT evidence of acute infarct. The bone windows demonstrate no abnormalities. Left maxillary sinus mucous retention cyst. Mastoid air cells are clear.     No acute abnormality. Electronically signed by GAY SKIFF    CTA HEAD NECK W CONTRAST  Result Date: 07/24/2024  EXAM: CTA CODE NEURO HEAD AND NECK W CONT INDICATION: Left facial droop, left upper and lower extremity weakness  COMPARISON: None. CONTRAST: 100 mL of Isovue -370. TECHNIQUE:  Unenhanced scout images were obtained to localize the volume for acquisition.  Multislice helical axial CT angiography was performed from the aortic arch to the top of the head during uneventful rapid bolus intravenous contrast administration.   Coronal and sagittal reformations and 3D post processing was performed.  CT dose reduction was achieved through use of a standardized protocol tailored for this examination and automatic exposure control for dose modulation.  This study was analyzed by the [employer] algorithm. FINDINGS: DELAYED ENHANCEMENT HEAD CT No intra or extra-axial mass or collection. Ventricles are normal in size and configuration. The basal cisterns are patent. Dural venous sinuses are patent. No abnormal parenchymal or meningeal enhancement. Mucosal disease in the left maxillary sinus, with an air-fluid level. CTA NECK There is conventional three vessel arch anatomy. The bilateral subclavian, common carotid, and internal carotid arteries are patent with no flow-limiting stenosis. % of right carotid artery stenosis: 0 % of left carotid artery stenosis: 0 Measurements utilize NASCET criteria. NASCET method was utilized for calculating  stenosis. The vertebral arteries are codominant and patent.  The cervical soft tissues are unremarkable.  There are degenerative changes of the cervical spine. Subpleural emphysema. CTA HEAD Mild atherosclerotic disease of the bilateral intracranial internal carotid arteries, without hemodynamically significant stenosis. The bilateral middle and anterior cerebral arteries are patent, although the left A1 segment is hypoplastic. The anterior communicating artery is patent. Bilateral V4 segments are patent. The basilar artery is patent. The bilateral posterior communicating arteries are diminutive. The bilateral P1 segments are widely patent. The posterior cerebral arteries are patent. No intracranial aneurysm. No  intracranial hemodynamically significant stenosis.     1. No acute vascular abnormality, no large vessel occlusion. No hemodynamically definite stenosis. 2. Left maxillary sinus mucosal disease, with air-fluid level concerning for acute sinusitis. Electronically signed by Rankin Field    CT Head W/O Contrast  Result Date: 07/24/2024  EXAM: CT CODE NEURO HEAD WO CONTRAST INDICATION: Left facial droop, left upper extremity weakness, left lower extremity weakness COMPARISON: None. CONTRAST: None. TECHNIQUE: Unenhanced CT of the head was performed using 5 mm images. Brain and bone windows were generated. Coronal and sagittal reformats. CT dose reduction was achieved through use of a standardized protocol tailored for this examination and automatic exposure control for dose modulation.  FINDINGS: The ventricles and sulci are normal in size, shape and configuration. There is no significant white matter disease. There is no intracranial hemorrhage, extra-axial collection, or mass effect. The basilar cisterns are open. No CT evidence of acute infarct. The bone windows demonstrate no abnormalities. Near complete opacification of the left maxillary sinus. Mild mucosal thickening in the right maxillary sinus..     No acute intracranial abnormality. Electronically signed by Harden Cahill           Video Attestation:             ?I discussed the risks and benefits of a telehealth visit and I obtained the patient's or legally authorized representative's informed verbal consent to conduct this assessment using telehealth tools.? All of the patients or legally authorized representative's questions regarding the telehealth interaction were addressed. I provided my name and disclosed to the patient or legally authorized representative my licensure, certification, or registration. ?This consultation was remotely performed at the request of Fernande Comer HERO, MD with the assistance of a trained virtual health liaison working at the  originating site.? The consultation used real time licensed and encrypted telehealth equipment which allowed a live video connection between my location and the patient's location, [facility].? Aspects of the evaluation that could not be adequately evaluated by virtual assessment were shared with both the patient and the consulting provider.?          A total of 50 minutes of time was spent in direct care and coordination of care with the patient.      "

## 2024-07-25 NOTE — Plan of Care (Signed)
"    Problem: Discharge Planning  Goal: Discharge to home or other facility with appropriate resources  07/25/2024 2207 by Julieanne Oz, RN  Outcome: Progressing  Flowsheets (Taken 07/25/2024 2000)  Discharge to home or other facility with appropriate resources: Identify barriers to discharge with patient and caregiver  07/25/2024 1116 by Sophia Rattan, RN  Outcome: Progressing  Flowsheets (Taken 07/25/2024 1116)  Discharge to home or other facility with appropriate resources: Identify barriers to discharge with patient and caregiver     Problem: Chronic Conditions and Co-morbidities  Goal: Patient's chronic conditions and co-morbidity symptoms are monitored and maintained or improved  07/25/2024 2207 by Julieanne Oz, RN  Outcome: Progressing  Flowsheets (Taken 07/25/2024 2000)  Care Plan - Patient's Chronic Conditions and Co-Morbidity Symptoms are Monitored and Maintained or Improved: Monitor and assess patient's chronic conditions and comorbid symptoms for stability, deterioration, or improvement  07/25/2024 1116 by Sophia Rattan, RN  Outcome: Progressing  Flowsheets (Taken 07/25/2024 1116)  Care Plan - Patient's Chronic Conditions and Co-Morbidity Symptoms are Monitored and Maintained or Improved:   Monitor and assess patient's chronic conditions and comorbid symptoms for stability, deterioration, or improvement   Collaborate with multidisciplinary team to address chronic and comorbid conditions and prevent exacerbation or deterioration     Problem: ABCDS Injury Assessment  Goal: Absence of physical injury  07/25/2024 2207 by Julieanne Oz, RN  Outcome: Progressing  07/25/2024 1116 by Sophia Rattan, RN  Outcome: Progressing  Flowsheets (Taken 07/25/2024 1116)  Absence of Physical Injury: Implement safety measures based on patient assessment     "

## 2024-07-25 NOTE — Plan of Care (Signed)
 "  Problem: Discharge Planning  Goal: Discharge to home or other facility with appropriate resources  07/25/2024 0037 by Jerona Lot, RN  Outcome: Progressing  07/24/2024 1830 by Sophia Rattan, RN  Outcome: Progressing  Flowsheets (Taken 07/24/2024 1830)  Discharge to home or other facility with appropriate resources: Identify barriers to discharge with patient and caregiver     Problem: Chronic Conditions and Co-morbidities  Goal: Patient's chronic conditions and co-morbidity symptoms are monitored and maintained or improved  07/25/2024 0037 by Jerona Lot, RN  Outcome: Progressing  07/24/2024 1830 by Sophia Rattan, RN  Outcome: Progressing  Flowsheets (Taken 07/24/2024 1830)  Care Plan - Patient's Chronic Conditions and Co-Morbidity Symptoms are Monitored and Maintained or Improved:   Monitor and assess patient's chronic conditions and comorbid symptoms for stability, deterioration, or improvement   Collaborate with multidisciplinary team to address chronic and comorbid conditions and prevent exacerbation or deterioration     Problem: ABCDS Injury Assessment  Goal: Absence of physical injury  07/25/2024 0037 by Jerona Lot, RN  Outcome: Progressing  07/24/2024 1830 by Sophia Rattan, RN  Outcome: Progressing  Flowsheets (Taken 07/24/2024 1830)  Absence of Physical Injury: Implement safety measures based on patient assessment     Problem: Safety - Adult  Goal: Free from fall injury  07/25/2024 0037 by Jerona Lot, RN  Outcome: Progressing  07/24/2024 1830 by Sophia Rattan, RN  Outcome: Progressing  Flowsheets (Taken 07/24/2024 1830)  Free From Fall Injury: Instruct family/caregiver on patient safety     Problem: Pain  Goal: Verbalizes/displays adequate comfort level or baseline comfort level  07/25/2024 0037 by Jerona Lot, RN  Outcome: Progressing  07/24/2024 1830 by Sophia Rattan, RN  Outcome: Progressing  Flowsheets (Taken 07/24/2024 1830)  Verbalizes/displays adequate comfort level  or baseline comfort level:   Encourage patient to monitor pain and request assistance   Assess pain using appropriate pain scale   Implement non-pharmacological measures as appropriate and evaluate response   Administer analgesics based on type and severity of pain and evaluate response     Problem: Neurosensory - Adult  Goal: Achieves stable or improved neurological status  07/25/2024 0037 by Jerona Lot, RN  Outcome: Progressing  07/24/2024 1830 by Sophia Rattan, RN  Outcome: Progressing  Flowsheets (Taken 07/24/2024 1830)  Achieves stable or improved neurological status: Assess for and report changes in neurological status     Problem: Cardiovascular - Adult  Goal: Maintains optimal cardiac output and hemodynamic stability  07/25/2024 0037 by Jerona Lot, RN  Outcome: Progressing  07/24/2024 1830 by Sophia Rattan, RN  Outcome: Progressing  Flowsheets (Taken 07/24/2024 1830)  Maintains optimal cardiac output and hemodynamic stability:   Monitor blood pressure and heart rate   Monitor urine output and notify Licensed Independent Practitioner for values outside of normal range     Problem: Skin/Tissue Integrity - Adult  Goal: Skin integrity remains intact  07/25/2024 0037 by Jerona Lot, RN  Outcome: Progressing  07/24/2024 1830 by Sophia Rattan, RN  Outcome: Progressing  Flowsheets (Taken 07/24/2024 1830)  Skin Integrity Remains Intact:   Monitor for areas of redness and/or skin breakdown   Assess vascular access sites hourly   Turn and reposition as indicated   Monitor skin under medical devices   Check visual cues for pain     Problem: Musculoskeletal - Adult  Goal: Return mobility to safest level of function  07/25/2024 0037 by Jerona Lot, RN  Outcome: Progressing  07/24/2024 1830 by Sophia Rattan, RN  Outcome: Progressing  Flowsheets (Taken 07/24/2024 1830)  Return Mobility to Safest Level of Function:   Assess patient stability and activity tolerance for standing, transferring and  ambulating with or without assistive devices   Assist with transfers and ambulation using safe patient handling equipment as needed     Problem: Gastrointestinal - Adult  Goal: Minimal or absence of nausea and vomiting  07/25/2024 0037 by Jerona Lot, RN  Outcome: Progressing  07/24/2024 1830 by Sophia Rattan, RN  Outcome: Progressing  Flowsheets (Taken 07/24/2024 1830)  Minimal or absence of nausea and vomiting: Advance diet as tolerated, if ordered     Problem: Infection - Adult  Goal: Absence of infection at discharge  07/25/2024 0037 by Jerona Lot, RN  Outcome: Progressing  07/24/2024 1830 by Sophia Rattan, RN  Outcome: Progressing  Flowsheets (Taken 07/24/2024 1830)  Absence of infection at discharge:   Assess and monitor for signs and symptoms of infection   Monitor lab/diagnostic results   Monitor all insertion sites i.e., indwelling lines, tubes and drains     Problem: Hematologic - Adult  Goal: Maintains hematologic stability  07/25/2024 0037 by Jerona Lot, RN  Outcome: Progressing  07/24/2024 1830 by Sophia Rattan, RN  Outcome: Progressing  Flowsheets (Taken 07/24/2024 1830)  Maintains hematologic stability:   Assess for signs and symptoms of bleeding or hemorrhage   Monitor labs for bleeding or clotting disorders     "

## 2024-07-25 NOTE — Progress Notes (Signed)
"  Pt off the floor in CT for 1200 neuro-check. Will assess when pt returns to the floor.   "

## 2024-07-25 NOTE — Progress Notes (Signed)
"  Spiritual Health Attempted Visit Note  [place]      Room # 267/01    Name: Evan Woods           Age: 38 y.o.    Gender: male          MRN: [id number]  Religion: Christian       Preferred Language: English      Date: 07/25/24  Visit Time: Begin Time: (P) 1020 End Time : (P) 1025 Complexity of Encounter: (P) Low      Visit Summary: Chaplain attempted to meet with patient. Patient was asleep. Chaplain will continue to follow/ attempt to visit at another time. Chaplain available for follow-up as needed.      Patient was not available. Patient was working with other staff/was asleep/ indisposed. Chaplain will follow-up at a later time.        Electronically Signed By: Elia Resident Quinton Vicci Raddle. MDIV on 07/25/2024 at 11:50 AM  Spiritual Health  [facility]  Please call the Operator at [phone] to page the Chaplain       "

## 2024-07-25 NOTE — Plan of Care (Signed)
 "  Problem: Discharge Planning  Goal: Discharge to home or other facility with appropriate resources  07/25/2024 1116 by Sophia Rattan, RN  Outcome: Progressing  Flowsheets (Taken 07/25/2024 1116)  Discharge to home or other facility with appropriate resources: Identify barriers to discharge with patient and caregiver  07/25/2024 0037 by Jerona Lot, RN  Outcome: Progressing     Problem: Chronic Conditions and Co-morbidities  Goal: Patient's chronic conditions and co-morbidity symptoms are monitored and maintained or improved  07/25/2024 1116 by Sophia Rattan, RN  Outcome: Progressing  Flowsheets (Taken 07/25/2024 1116)  Care Plan - Patient's Chronic Conditions and Co-Morbidity Symptoms are Monitored and Maintained or Improved:   Monitor and assess patient's chronic conditions and comorbid symptoms for stability, deterioration, or improvement   Collaborate with multidisciplinary team to address chronic and comorbid conditions and prevent exacerbation or deterioration  07/25/2024 0037 by Jerona Lot, RN  Outcome: Progressing     Problem: ABCDS Injury Assessment  Goal: Absence of physical injury  07/25/2024 1116 by Sophia Rattan, RN  Outcome: Progressing  Flowsheets (Taken 07/25/2024 1116)  Absence of Physical Injury: Implement safety measures based on patient assessment  07/25/2024 0037 by Jerona Lot, RN  Outcome: Progressing     Problem: Safety - Adult  Goal: Free from fall injury  07/25/2024 1116 by Sophia Rattan, RN  Outcome: Progressing  Flowsheets (Taken 07/25/2024 1116)  Free From Fall Injury: Instruct family/caregiver on patient safety  07/25/2024 0037 by Jerona Lot, RN  Outcome: Progressing     Problem: Pain  Goal: Verbalizes/displays adequate comfort level or baseline comfort level  07/25/2024 1116 by Sophia Rattan, RN  Outcome: Progressing  Flowsheets (Taken 07/25/2024 1116)  Verbalizes/displays adequate comfort level or baseline comfort level:   Encourage patient to monitor pain  and request assistance   Assess pain using appropriate pain scale   Implement non-pharmacological measures as appropriate and evaluate response   Administer analgesics based on type and severity of pain and evaluate response  07/25/2024 0037 by Jerona Lot, RN  Outcome: Progressing     Problem: Neurosensory - Adult  Goal: Achieves stable or improved neurological status  07/25/2024 1116 by Sophia Rattan, RN  Outcome: Progressing  Flowsheets (Taken 07/25/2024 1116)  Achieves stable or improved neurological status:   Assess for and report changes in neurological status   Initiate measures to prevent increased intracranial pressure  07/25/2024 0037 by Jerona Lot, RN  Outcome: Progressing     Problem: Cardiovascular - Adult  Goal: Maintains optimal cardiac output and hemodynamic stability  07/25/2024 1116 by Sophia Rattan, RN  Outcome: Progressing  Flowsheets (Taken 07/25/2024 1116)  Maintains optimal cardiac output and hemodynamic stability:   Monitor blood pressure and heart rate   Monitor urine output and notify Licensed Independent Practitioner for values outside of normal range  07/25/2024 0037 by Jerona Lot, RN  Outcome: Progressing     Problem: Skin/Tissue Integrity - Adult  Goal: Skin integrity remains intact  07/25/2024 1116 by Sophia Rattan, RN  Outcome: Progressing  Flowsheets (Taken 07/25/2024 1116)  Skin Integrity Remains Intact:   Monitor for areas of redness and/or skin breakdown   Assess vascular access sites hourly   Monitor skin under medical devices   Check visual cues for pain  07/25/2024 0037 by Jerona Lot, RN  Outcome: Progressing     Problem: Musculoskeletal - Adult  Goal: Return mobility to safest level of function  07/25/2024 1116 by Sophia Rattan, RN  Outcome: Progressing  Flowsheets (Taken 07/25/2024 1116)  Return  Mobility to Safest Level of Function:   Assess patient stability and activity tolerance for standing, transferring and ambulating with or without assistive  devices   Assist with transfers and ambulation using safe patient handling equipment as needed   Obtain physical therapy/occupational therapy consults as needed  07/25/2024 0037 by Jerona Lot, RN  Outcome: Progressing     Problem: Infection - Adult  Goal: Absence of infection at discharge  07/25/2024 1116 by Sophia Rattan, RN  Outcome: Progressing  Flowsheets (Taken 07/25/2024 1116)  Absence of infection at discharge:   Assess and monitor for signs and symptoms of infection   Monitor lab/diagnostic results   Monitor all insertion sites i.e., indwelling lines, tubes and drains  07/25/2024 0037 by Jerona Lot, RN  Outcome: Progressing     Problem: Hematologic - Adult  Goal: Maintains hematologic stability  07/25/2024 1116 by Sophia Rattan, RN  Outcome: Progressing  Flowsheets (Taken 07/25/2024 1116)  Maintains hematologic stability:   Assess for signs and symptoms of bleeding or hemorrhage   Monitor labs for bleeding or clotting disorders  07/25/2024 0037 by Jerona Lot, RN  Outcome: Progressing     "

## 2024-07-25 NOTE — Progress Notes (Signed)
 "CRITICAL CARE PROGRESS NOTE    Name: XZAYVION VAETH   DOB: Sep 05, 1985   MRN: [id number]   Date: 07/25/2024      Diagnoses/problem list:   Ischemic stroke  Hypertension  Atrial fibrillation, paroxysmal    HPI   Mr. Kwasnik is a 38 year old male who presented to the ED with acute onset severe headache on the right side and progressive weakness and numbness on the left and dysarthria. He has a history of a TIA previously and knew to report to the ED as soon as possible. He also reports history of paroxysmal atrial fibrillation but stopped taking his eliquis  last week when he ran out of his prescription. He has additional past medical history of leukemia s/p bone marrow transplant, PTSD, hypertension, and MI in 2017. He reports his blood pressure has also been elevated the past several days.     12/25: still reports a headache this am. Speech and motor both improved since yesterday    ROS negative except as otherwise documented.    Assessment and plan:     NEUROLOGICAL:    Analgesia / sedation  Ischemic stroke s/p TNK (1305 administered)  PTSD  -CTH negative for acute findings and so TNK was administered with improvement in dysarthria and left sided weakness/numbness  -does take seroquel  and minipress  at home for PTSD  -received TNK at 1306  -repeat CTH and MRI ordered  -MRI unable to be completed today due to Holiday; will follow with CTH  -continue neurochecks per protocol  -tele-neurology consulted    PULMONOLOGY:   No acute pulmonary concerns  - maintain SpO2>=92, pH>=7.30  - follow ABG and CXR    CARDIOVASCULAR:   Hypertension  -Goal MAP>=65  -Trend troponin / lactate PRN  -Check TTE  -EKG PRN  -labetalol  and hydralazine  PRN for BP control  -reports he takes lisinopril  and clonidine  for BP control  -will resume clonidine  but hold lisinopril  in setting of normotension to avoid hypotension    GASTROINTESTINAL:   Nutrition: regular  -SLP consulted  Bowel regimen  -Colace 100mg  po daily  -bisacodyl PRN  GI Px  -Pepcid   20mg       RENAL/ELECTROLYTE:   No acute renal concerns  - goal K>=4, Mg>=2, Phos >3  - daily BMP  - strict I/O's; daily weights  - avoid nephrotoxic medications    ENDOCRINE:   No known metabolic conditions  -glucose POCT PRN    HEMATOLOGY/ONCOLOGY:   History of leukemia s/p BMT  VTE Px  -Goal Hb>=7  -Trend CBC,PTT/INR  -Enoxaparin  on hold,  post interval CTH  -SCDs    ID/MICRO:   No acute infectious concerns  -Trend fevers, WBC    ICU DAILY CHECKLIST     Code Status:full  DVT Prophylaxis:on hold  T/L/D: PIV  Diet: regular  Activity Level: as tolerated  ABCDEF Bundle/Checklist Completed: Yes  Disposition: Stay in ICU  Multidisciplinary Rounds Completed:  Yes    OBJECTIVE:     Blood pressure 133/84, pulse 75, temperature 97.7 F (36.5 C), temperature source Oral, resp. rate 14, height 1.829 m (6' 0.01), weight 104.6 kg (230 lb 9.6 oz), SpO2 100%.  Physical Exam  Gen: Not in distress  HEENT: NC/AT, supple  Cardiac: Regular rate and rhythm, no murmurs  Pulm: Clear to auscultation bilaterally  ABD: Soft, non distended, non tender, normal bowel sounds  Extremities: no significant edema  Neuro: A/O X 3, left sided weakness improved in upper and lower extremities. No evidence of  facial droop this am    Labs and Data: Reviewed 07/25/24  Medications: Reviewed 07/25/24  Imaging: Reviewed 07/25/24    Intake/Output:     Intake/Output Summary (Last 24 hours) at 07/25/2024 0946  Last data filed at 07/25/2024 0600  Gross per 24 hour   Intake --   Output 475 ml   Net -475 ml       CRITICAL CARE DOCUMENTATION  I had a face to face encounter with the patient, reviewed and interpreted patient data including clinical events, labs, images, vital signs, I/O's, and examined patient.  I have discussed the case and the plan and management of the patient's care with the consulting services, the bedside nurses and the respiratory therapist.      NOTE OF PERSONAL INVOLVEMENT IN CARE   This patient has a high probability of imminent,  clinically significant deterioration, which requires the highest level of preparedness to intervene urgently. I participated in the decision-making and personally managed or directed the management of the following life and organ supporting interventions that required my frequent assessment to treat or prevent imminent deterioration.    I personally spent 40 minutes of critical care time.  This is time spent at this critically ill patient's bedside actively involved in patient care as well as the coordination of care.  This does not include any procedural time which has been billed separately.    Mallie Sage, MD  Critical Care Medicine  [facility]    "

## 2024-07-26 ENCOUNTER — Inpatient Hospital Stay: Admit: 2024-07-26 | Payer: MEDICAID

## 2024-07-26 LAB — ECHO (TTE) LIMITED (PRN CONTRAST/BUBBLE/STRAIN/3D)
AV Area by Peak Velocity: 3.6 cm2
AV Area by VTI: 3.7 cm2
AV Mean Gradient: 4 mmHg
AV Mean Velocity: 0.9 m/s
AV Peak Gradient: 7 mmHg
AV Peak Velocity: 1.3 m/s
AV VTI: 25 cm
AV Velocity Ratio: 1
AVA/BSA Peak Velocity: 1.6 cm2/m2
AVA/BSA VTI: 1.6 cm2/m2
Ao Root Index: 1.46 cm/m2
Aortic Root: 3.3 cm
Ascending Aorta Index: 1.19 cm/m2
Ascending Aorta: 2.7 cm
Body Surface Area: 2.31 m2
EF BP: 55 % (ref 55–100)
EF Physician: 60 %
Fractional Shortening 2D: 26 % (ref 28–44)
IVSd: 0.9 cm (ref 0.6–1.0)
LA Diameter: 4.1 cm
LA Size Index: 1.81 cm/m2
LA/AO Root Ratio: 1.24
LV EDV A2C: 84 mL
LV EDV A4C: 107 mL
LV EDV Index A2C: 37 mL/m2
LV EDV Index A4C: 47 mL/m2
LV ESV A2C: 40 mL
LV ESV A4C: 41 mL
LV ESV Index A2C: 18 mL/m2
LV ESV Index A4C: 18 mL/m2
LV Ejection Fraction A2C: 53 %
LV Ejection Fraction A4C: 62 %
LV Mass 2D Index: 54.6 g/m2 (ref 49–115)
LV Mass 2D: 123.3 g (ref 88–224)
LV RWT Ratio: 0.42
LVIDd Index: 1.9 cm/m2
LVIDd: 4.3 cm (ref 4.2–5.9)
LVIDs Index: 1.42 cm/m2
LVIDs: 3.2 cm
LVOT Area: 3.8 cm2
LVOT Diameter: 2.2 cm
LVOT Mean Gradient: 3 mmHg
LVOT Peak Gradient: 7 mmHg
LVOT Peak Velocity: 1.3 m/s
LVOT SV: 91 mL
LVOT Stroke Volume Index: 40.3 mL/m2
LVOT VTI: 24 cm
LVOT:AV VTI Index: 0.96
LVPWd: 0.9 cm (ref 0.6–1.0)
MR Peak Gradient: 18 mmHg
MR Peak Velocity: 2.1 m/s
MV Area by VTI: 2.7 cm2
MV Max Velocity: 1.2 m/s
MV Mean Gradient: 3 mmHg
MV Mean Velocity: 0.8 m/s
MV Peak Gradient: 5 mmHg
MV VTI: 34 cm
MV:LVOT VTI Index: 1.42
PR Max Velocity: 1 m/s
PV AT: 102 ms
PV Max Velocity: 0.8 m/s
PV Mean Gradient: 1 mmHg
PV Mean Velocity: 0.6 m/s
PV Peak Gradient: 2 mmHg
PV VTI: 16.4 cm
Pulmonary Artery EDP: 4 mmHg

## 2024-07-26 LAB — BASIC METABOLIC PANEL
Anion Gap: 11 mmol/L (ref 2–14)
BUN/Creatinine Ratio: 15 (ref 12–20)
BUN: 15 mg/dL (ref 6–20)
CO2: 23 mmol/L (ref 20–29)
Calcium: 8.2 mg/dL — ABNORMAL LOW (ref 8.6–10.0)
Chloride: 107 mmol/L (ref 98–107)
Creatinine: 1.01 mg/dL (ref 0.70–1.20)
Est, Glom Filt Rate: >90 ml/min/1.73m2 (ref >59)
Glucose: 111 mg/dL — ABNORMAL HIGH (ref 65–100)
Potassium: 3.8 mmol/L (ref 3.5–5.1)
Sodium: 140 mmol/L (ref 136–145)

## 2024-07-26 LAB — CBC
Hematocrit: 37.2 % (ref 36.6–50.3)
Hemoglobin: 12.7 g/dL (ref 12.1–17.0)
MCH: 31.8 pg (ref 26.0–34.0)
MCHC: 34.1 g/dL (ref 30.0–36.5)
MCV: 93.2 FL (ref 80.0–99.0)
MPV: 9.8 FL (ref 8.9–12.9)
Nucleated RBCs: 0 /100{WBCs}
Platelets: 200 K/uL (ref 150–400)
RBC: 3.99 M/uL — ABNORMAL LOW (ref 4.10–5.70)
RDW: 13.5 % (ref 11.5–14.5)
WBC: 3.2 K/uL — ABNORMAL LOW (ref 4.1–11.1)
nRBC: 0 K/uL (ref 0.00–0.01)

## 2024-07-26 LAB — PHOSPHORUS: Phosphorus: 4 mg/dL (ref 2.5–4.5)

## 2024-07-26 LAB — MAGNESIUM: Magnesium: 2.2 mg/dL (ref 1.6–2.6)

## 2024-07-26 MED ORDER — ATORVASTATIN CALCIUM 80 MG PO TABS
80 | ORAL_TABLET | Freq: Every evening | ORAL | 3 refills | 90.00000 days | Status: AC
Start: 2024-07-26 — End: ?

## 2024-07-26 MED ORDER — APIXABAN 5 MG PO TABS
5 | ORAL_TABLET | Freq: Two times a day (BID) | ORAL | 3 refills | 30.00000 days | Status: AC
Start: 2024-07-26 — End: ?

## 2024-07-26 MED ORDER — OXYCODONE HCL 5 MG PO TABS
5 | Freq: Four times a day (QID) | ORAL | Status: DC | PRN
Start: 2024-07-26 — End: 2024-07-26
  Administered 2024-07-26: 16:00:00 5 mg via ORAL

## 2024-07-26 MED ORDER — ASPIRIN 81 MG PO CHEW
81 | ORAL_TABLET | Freq: Every day | ORAL | 3 refills | 30.00000 days | Status: AC
Start: 2024-07-26 — End: ?

## 2024-07-26 MED ORDER — BUSPIRONE HCL 10 MG PO TABS
10 | Freq: Two times a day (BID) | ORAL | Status: DC
Start: 2024-07-26 — End: 2024-07-26

## 2024-07-26 MED FILL — FAMOTIDINE (PF) 20 MG/2 ML IV SOLN: 20 MG/2ML | INTRAVENOUS | Qty: 2 | Fill #0

## 2024-07-26 MED FILL — BUSPIRONE HCL 5 MG PO TABS: 5 mg | ORAL | Qty: 1 | Fill #0

## 2024-07-26 MED FILL — ASPIRIN LOW STRENGTH 81 MG PO CHEW: 81 mg | ORAL | Qty: 1 | Fill #0

## 2024-07-26 MED FILL — QUETIAPINE FUMARATE 25 MG PO TABS: 25 mg | ORAL | Qty: 2 | Fill #0

## 2024-07-26 MED FILL — CLONIDINE HCL 0.1 MG PO TABS: 0.1 mg | ORAL | Qty: 1 | Fill #0

## 2024-07-26 MED FILL — PRAZOSIN HCL 1 MG PO CAPS: 1 mg | ORAL | Qty: 2 | Fill #0

## 2024-07-26 MED FILL — FENTANYL CITRATE (PF) 100 MCG/2ML IJ SOLN: 100 MCG/2ML | INTRAMUSCULAR | Qty: 2 | Fill #0

## 2024-07-26 MED FILL — ELIQUIS 5 MG PO TABS: 5 mg | ORAL | Qty: 1 | Fill #0

## 2024-07-26 MED FILL — ATORVASTATIN CALCIUM 40 MG PO TABS: 40 mg | ORAL | Qty: 2 | Fill #0

## 2024-07-26 MED FILL — OXYCODONE HCL 5 MG PO TABS: 5 mg | ORAL | Qty: 1 | Fill #0

## 2024-07-26 NOTE — Progress Notes (Signed)
"  Consult received for bedside swallow evaluation. Patient passed swallow screen by nsg and tolerating regular diet. Patient denies dysphagia and speech language deficits. MRI head neg for acute findings. Screen completed. No ST needs identified. Will d/c ST at this time. Please re-consult as medically indicated.    "

## 2024-07-26 NOTE — Care Coordination (Signed)
"     07/26/24 1110   Service Assessment   Information Provided By Patient   Patient Orientation Alert   Cognition No Apparent Deficit   Primary Caregiver Self   Support Systems Children;Family Members   Patient's Healthcare Decision Maker is: Legal Next of Kin   PCP Verified by CM Yes   Last Visit to PCP Within last 3 months   Prior Functional Level Independent in ADLs/IADLs   History of falls? 0   Current Functional Level at Time of Initial Assessment Independent in ADLs/IADLs   Can patient return to prior living arrangement Yes   Ability to make needs known: Good   Family able to assist with home care needs: No   Current Ship Broker At/After Discharge   Who will provide transportation at discharge? Other (see comment)  (TBD)     Met f/f with Pt, he stated that he lives at: [address] [place], [place]. Pt stated that he lives alone    No HH, no DME and independent with ADL, Pt stated that he works at [employer].     Send Rx [facility] in [place].     Transportation: TBD    CM dispo: TBD    Advance Care Planning     General Advance Care Planning (ACP) Conversation    Date of Conversation: 07/26/2024      Healthcare Decision Maker: No healthcare decision makers have been documented.       Content/Action Overview:    Reviewed DNR/DNI and patient elects Full Code (Attempt Resuscitation)                 "

## 2024-07-26 NOTE — Discharge Summary (Signed)
 "  HOSPITAL DISCHARGE SUMMARY    Patient: Evan Woods       MRN: [id number]       Date of birth: [birth year]       Age: 38 y.o.     Date of admission: 07/24/2024  Date of discharge:  07/26/2024    Primary care provider:  Myra Locus, MD   Admitting provider:  Comer CHRISTELLA Sage, MD    Discharging provider(s):  Tenita Cue - Staff Intensivist     Consultations  IP CONSULT TO CASE MANAGEMENT  IP CONSULT TO SPIRITUAL SERVICES  IP CONSULT TO TELE-NEUROLOGY    Procedures  None    Discharge Destination: Home.  The patient is stable for discharge.    Admission diagnosis  CVA (cerebral vascular accident) (HCC) [I63.9]  Ischemic stroke (HCC) [I63.9]    Please refer to the admission history and physical for details on the presenting problem.     Brief hospital course  38 year old male who presented to the ED with acute onset severe headache on the right side and progressive weakness and numbness on the left and dysarthria. He has a history of a TIA previously and knew to report to the ED as soon as possible. He also reports history of paroxysmal atrial fibrillation but stopped taking his eliquis  last week when he ran out of his prescription. He has additional past medical history of leukemia s/p bone marrow transplant, PTSD, hypertension, and MI in 2017. He reports his blood pressure has also been elevated the past several days.     He received TNK in the ED. Monitored in ICU for 48 hours. MR brain was done which did not show any acute stroke. He was seen by neurology and physical therapy. Going home today with a cane to ambulate with assistance.     Physical examination at discharge  Vitals:    07/26/24 0700   BP:    Pulse:    Resp:    Temp: 97.9 F (36.6 C)   SpO2:           Recent Labs     07/24/24  1235 07/25/24  0255 07/26/24  0310   WBC 7.3 3.6* 3.2*   HGB 14.8 12.9 12.7   HCT 42.8 37.6 37.2   PLT 271 204 200     Recent Labs     07/24/24  1235 07/25/24  0255 07/26/24  0310   NA 141 139 140   K 4.0 3.8 3.8   CL 107 106  107   CO2 21 23 23    BUN 15 17 15    MG 2.0 2.1 2.2   PHOS  --  3.8 4.0     Recent Labs     07/24/24  1235   GLOB 3.8     Recent Labs     07/24/24  1235   INR 1.0            Current Outpatient Medications   Medication Instructions    apixaban  (ELIQUIS ) 5 mg, Oral, 2 TIMES DAILY    [START ON 07/27/2024] aspirin  81 mg, Oral, DAILY    atorvastatin  (LIPITOR ) 80 mg, Oral, NIGHTLY    busPIRone  (BUSPAR ) 10 mg, Oral, 2 TIMES DAILY    cloNIDine  (CATAPRES ) 0.1 mg, 2 TIMES DAILY    prazosin  (MINIPRESS ) 2 mg, NIGHTLY    QUEtiapine  (SEROQUEL ) 50 mg, 2 TIMES DAILY          Time spent on discharge related activities today greater  than 30 minutes.        Dr. Ercilia Bettinger  Intensivist  "

## 2024-07-26 NOTE — Progress Notes (Addendum)
 "CRITICAL CARE PROGRESS NOTE    Name: Evan Woods   DOB: 02-Apr-1986   MRN: [id number]   Date: 07/26/2024      Diagnoses/problem list:   Ischemic stroke  Hypertension  Atrial fibrillation, paroxysmal    HPI   Evan Woods is a 38 year old male who presented to the ED with acute onset severe headache on the right side and progressive weakness and numbness on the left and dysarthria. He has a history of a TIA previously and knew to report to the ED as soon as possible. He also reports history of paroxysmal atrial fibrillation but stopped taking his eliquis  last week when he ran out of his prescription. He has additional past medical history of leukemia s/p bone marrow transplant, PTSD, hypertension, and MI in 2017. He reports his blood pressure has also been elevated the past several days.     12/25: still reports a headache this am. Speech and motor both improved since yesterday  12/26: bilateral hip pain post TNK. Plan for MRI today and potential discharge    ROS negative except as otherwise documented.    Assessment and plan:     NEUROLOGICAL:    Analgesia / sedation  Ischemic stroke s/p TNK (1305 administered)  PTSD  -CTH negative for acute findings and so TNK was administered with improvement in dysarthria and left sided weakness/numbness  -does take seroquel  and minipress  at home for PTSD  -received TNK at 1305  -repeat CTH and MRI ordered  -MRI unable to be completed due to Holiday; repeat CTH negative for acute findings  -plan for MRI today and potential discharge pending PT/OT  -continue neurochecks per protocol  -tele-neurology consulted  -headache resolved after single dose of imitrex. Risks of single dose of imitrex discussed with patient prior to administration  -patient requested a different neurologist for OP follow up    PULMONOLOGY:   No acute pulmonary concerns  - maintain SpO2>=92, pH>=7.30  - follow ABG and CXR    CARDIOVASCULAR:   Hypertension  Atrial fibrillation, paroxysmal  -Goal MAP>=65  -Trend  troponin / lactate PRN  -TTE LVEF 55%, formal read pending  -EKG PRN  -labetalol  and hydralazine  PRN for BP control  -reports he takes lisinopril  and clonidine  for BP control  -will resume clonidine  but hold lisinopril  in setting of normotension to avoid hypotension  -resumed eliquis  post negative CT head    GASTROINTESTINAL:   Nutrition: regular  Bowel regimen  -Colace 100mg  po daily  -bisacodyl PRN  GI Px  -Pepcid  20mg       RENAL/ELECTROLYTE:   No acute renal concerns  - goal K>=4, Mg>=2, Phos >3  - daily BMP  - strict I/O's; daily weights  - avoid nephrotoxic medications    ENDOCRINE:   No known metabolic conditions  -glucose POCT PRN    HEMATOLOGY/ONCOLOGY:   History of leukemia s/p BMT  VTE Px  -Goal Hb>=7  -Trend CBC,PTT/INR  -eliquis  resumed  -SCDs    ID/MICRO:   No acute infectious concerns  -Trend fevers, WBC    ICU DAILY CHECKLIST     Code Status:full  DVT Prophylaxis: eliquis   T/L/D: PIV  Diet: regular  Activity Level: as tolerated  ABCDEF Bundle/Checklist Completed: Yes  Disposition: discharge pending MRI  Multidisciplinary Rounds Completed:  Yes    OBJECTIVE:     Blood pressure 114/60, pulse 89, temperature 97.9 F (36.6 C), temperature source Oral, resp. rate 13, height 1.829 m (6' 0.01), weight 104.6 kg (230 lb  9.6 oz), SpO2 97%.  Physical Exam  Gen: Not in distress  HEENT: NC/AT, supple  Cardiac: Regular rate and rhythm, no murmurs  Pulm: Clear to auscultation bilaterally  ABD: Soft, non distended, non tender, normal bowel sounds  Extremities: no significant edema  Neuro: A/O X 3, left sided weakness improved in upper and lower extremities. No evidence of facial droop this am    Labs and Data: Reviewed 07/26/24  Medications: Reviewed 07/26/24  Imaging: Reviewed 07/26/24    Intake/Output:     Intake/Output Summary (Last 24 hours) at 07/26/2024 0927  Last data filed at 07/26/2024 0400  Gross per 24 hour   Intake --   Output 1800 ml   Net -1800 ml       CRITICAL CARE DOCUMENTATION  I had a face to  face encounter with the patient, reviewed and interpreted patient data including clinical events, labs, images, vital signs, I/O's, and examined patient.  I have discussed the case and the plan and management of the patient's care with the consulting services, the bedside nurses and the respiratory therapist.      NOTE OF PERSONAL INVOLVEMENT IN CARE   This patient has a high probability of imminent, clinically significant deterioration, which requires the highest level of preparedness to intervene urgently. I participated in the decision-making and personally managed or directed the management of the following life and organ supporting interventions that required my frequent assessment to treat or prevent imminent deterioration.    I personally spent n/a minutes of critical care time.  This is time spent at this critically ill patient's bedside actively involved in patient care as well as the coordination of care.  This does not include any procedural time which has been billed separately.    Evan Sage, MD  Critical Care Medicine  [facility]    "

## 2024-07-26 NOTE — Progress Notes (Signed)
 Physician Progress Note      PATIENTKESHON, MARKOVITZ  CSN #:                  [id number]  DOB:                       1985/12/04  ADMIT DATE:       07/24/2024 4:36 PM  DISCH DATE:        07/26/2024 12:51 PM  RESPONDING  PROVIDER #:        Rusty Blow MD          QUERY TEXT:    Please clarify in documentation the relationship, if any, between patient's   left-sided weakness and CVA.  Note: The terminology ?in the setting of? does   not indicate a cause-and-effect relationship.  Are the conditions:    The clinical indicators include:  H&P by Fernande Comer HERO, MD- [facility] negative for acute findings and so TNK was   administered with improvement in dysarthria and left sided weakness/numbness  does take seroquel  and minipress  at home for PTSD  received TNK at 1306  Neuro: A/O X 3, left sided weakness and diminished sensation. Slight right   sided facial droop  Options provided:  -- Related to or associated with each other  -- Unrelated to each other  -- Other - I will add my own diagnosis  -- Disagree - Not applicable / Not valid  -- Refer to Clinical Documentation Reviewer    PROVIDER RESPONSE TEXT:    The conditions noted are unrelated to each other.    Query created by: Koop, Katharine on 08/30/2024 8:25 AM      Electronically signed by:  Uyen Eichholz MD 09/01/2024 9:19 AM

## 2024-07-26 NOTE — Care Coordination (Addendum)
"  Received a referral for D/C.    Pt will D/C home, Pt needs a cab ride home to [place].     PT recommended HH, Pt has Medicaid [place], CM cannot set up [facility] since Pt lives in [place].    PT stated that she will get Pt a cane.     [address] [place].   Transition of Care Plan:    RUR: 10%  Prior Level of Functioning: IND  Disposition: Home  EDD: 12/26    Follow up appointments: Nursing  DME needed: No  Transportation at discharge: Cab  Ambulance transport?  Pt and family notified of potential financial responsibility for transport?          Who?  IM/IMM Medicare/Tricare letter given: No  Is patient a Veteran and connected with VA?    If yes, was [employer] transfer form completed and VA notified?   Caregiver Contact: No  Discharge Caregiver contacted prior to discharge? No  Care Conference needed? No  Barriers to discharge: None    "

## 2024-07-26 NOTE — Plan of Care (Signed)
 "PHYSICAL THERAPY EVALUATION  Patient: Evan Woods (38 y.o. male)  Date: 07/26/2024  Primary Diagnosis: CVA (cerebral vascular accident) [facility]) [I63.9]  Ischemic stroke (HCC) [I63.9]       Precautions: Fall Risk, Bed Alarm, General Precautions                      Recommendations for nursing mobility: Out of bed to chair for meals, Encourage HEP in prep for ADLs/mobility; see handout for details, Frequent repositioning to prevent skin breakdown, Use of bed/chair alarm for safety, LE elevation for management of edema, AD and gt belt for bed to chair , Amb to bathroom with AD and gait belt, Amb in hallway, and Assist x1    In place during session: EKG/telemetry  and Pulse ox    ASSESSMENT  Pt is a 38 y.o. male admitted on 07/24/2024 for acute onset of severe headache on the right side and progressive weakness and numbness on the left with dysarthria ; pt currently being treated for ischemic stroke, hypertension and A-fib . Pt semi-supine upon PT arrival, agreeable to evaluation. Pt A&O x 4.  Brain MRI on 07/26/2024 indicates no significant abnormality or acute process.     Based on the objective data described below, the patient currently presents with impaired functional mobility, decreased independence in ADLs, impaired ability to perform high-level IADLs, impaired strength, decreased coordination, impaired balance, impaired vision/visual deficit, and increased pain levels. (See below for objective details and assist levels).     Overall pt tolerated session good today with report of 6/10 B/L hip pain. Able to perform supine quad sets and  bed mobility Mod I toward EOB. Noted to report blurriness during visual acuity testing, however with intact saccades, smooth pursuits and convergence. No facial asymmetry noted. LUE MMT grossly 4/5 and LLE MMT grossly 4/5. RUE and RLE MMT grossly 5/5. Intact BUE/LE sensation. Decreased L hand grip strength. Intact coordination. Stood SBA-CGA using cane in RUE and ambulated 35  ft within room CGA. Noted to have narrowed BOS, shorted step length on the LLE, slow gait and intermittent trunk sway. Once in chair, patient educated on continued OOB mobility with nsg and continued independent performance of therex throughout the day. Pt will benefit from continued skilled PT to address above deficits and return to PLOF. Current PT DC recommendation Intermittent physical therapy up to 2-3x/week in previous living setting once medically appropriate.     Start of Session   SPO2 (%) 95   Heart Rate (BPM) 109     GOALS:  Problem: Physical Therapy - Adult  Goal: By Discharge: Performs mobility at highest level of function for planned discharge setting.  See evaluation for individualized goals.  Description: FUNCTIONAL STATUS PRIOR TO ADMISSION: The patient was independent with functional mobility, transfers and ADLs    HOME SUPPORT PRIOR TO ADMISSION: The patient lives alone and is independent with ADLs    Physical Therapy Goals  Initiated 07/26/2024  Pt stated goal: I want to go home  Pt will be I with LE HEP in 7 days.  Pt will perform bed mobility with Independence in 7 days.  Pt will perform transfers with Modified Independence in 7 days.   Pt will amb 20-30 feet with LRAD safely with Modified Independence in 7 days.  Pt will ascend/descend 4 steps with B/L handrail(s) and Modified Independence in 7 days to safely enter/navigate home.   Pt will demonstrate improvement in dynamic standing balance from CGA to Mod I  in 7 days.    Outcome: Progressing        PLAN :  Recommendations and Planned Interventions: bed mobility training, transfer training, gait training, therapeutic exercises, patient and family training/education, and therapeutic activities    Recommend next PT session: EOB transfers, seated static and dynamic balance, standing static and dynamic balance, ambulation with AD , LE therex, reinforce BEFAST, and stair education/completion     Frequency/Duration: Patient will be followed by  physical therapy:  2-3x/week to address goals.    Recommendation for discharge: (in order for the patient to meet his/her long term goals)  Intermittent physical therapy up to 2-3x/week in previous living setting     Potential barriers for safe discharge: pt lives alone and pt is a high fall risk.    IF patient discharges home will need the following DME: quad cane and straight cane       SUBJECTIVE:   Patient stated I used to be in the [employer] .    OBJECTIVE DATA SUMMARY:   HISTORY:    Past Medical History:   Diagnosis Date    Acute MI (HCC)     Atrial fibrillation (HCC)     Hypercholesteremia     Hypertension     Leukemia (HCC)     Pulmonary embolism (HCC) 2018     Past Surgical History:   Procedure Laterality Date    APPENDECTOMY      BONE MARROW TRANSPLANT  2017    CORONARY ANGIOPLASTY WITH STENT PLACEMENT      2019, 2015 x 2    ORTHOPEDIC SURGERY      shoulder surgery       Home Situation:  Social/Functional History  Lives With: Alone  Type of Home: House  Home Layout: One level  Home Access: Stairs to enter with rails  Entrance Stairs - Number of Steps: 4  Entrance Stairs - Rails: Both  Bathroom Shower/Tub: Electrical Engineer: None  Has the patient had two or more falls in the past year or any fall with injury in the past year?: No  Receives Help From: Family  Prior Level of Assist for ADLs: Independent  Prior Level of Assist for Homemaking: Independent  Homemaking Responsibilities: Yes  Prior Level of Assist for Ambulation: Teacher, adult education, with or without device  Prior Level of Assist for Transfers: Independent  Active Driver: Yes    Cognitive/Behavioral Status:  Orientation  Overall Orientation Status: Within Normal Limits  Orientation Level: Oriented X4  Cognition  Overall Cognitive Status: WNL    Strength:    Strength: Generally decreased, functional (LUE/LLE)    Range Of Motion:  AROM: Within functional limits  PROM: Within functional  limits    Coordination:  Coordination: Within functional limits     Tone & Sensation:      Sensation: Intact      Functional Mobility:  Bed Mobility:     Bed Mobility Training  Bed Mobility Training: Yes  Interventions: Verbal cues;Safety awareness training  Rolling: Modified independent  Supine to Sit: Modified independent  Scooting: Independent  Transfers:     Art Therapist: Yes  Interventions: Verbal cues;Safety awareness training;Manual cues  Sit to Stand: Stand by assistance;Contact guard assistance  Stand to Sit: Stand by assistance;Contact guard assistance  Balance:     Balance  Sitting: Intact  Standing: Impaired;With support  Standing - Static: Good;Constant support  Standing - Dynamic: Good;Constant support  Wheelchair Mobility:  Ambulation/Gait Training:  Gait  Gait Training: Yes  Overall Level of Assistance: Contact guard assistance  Distance (ft): 35 Feet  Assistive Device: Gait belt;Cane, quad  Interventions: Verbal cues;Safety awareness training;Manual cues  Base of Support: Narrowed  Speed/Cadence: Slow  Step Length: Left shortened  Gait Abnormalities: Decreased step clearance  Therapeutic Intervention provided:   bed mobility , EOB transfers, OOB transfers, amb with AD, LE therapeutic exercises, seated static and dynamic balance, and standing static and dynamic balance    Treatment Rendered:   Yes. Therapeutic Activities interventions rendered to provide treatment for loss of mobility, strength, balance, and coordination to improve functional mobility performance.     Functional Measure:  [facility] AM-PAC 6 Clicks         Basic Mobility Inpatient Short Form  How much difficulty does the patient currently have... Unable A Lot A Little None   1.  Turning over in bed (including adjusting bedclothes, sheets and blankets)?   []  1   []  2   []  3   [x]  4   2.  Sitting down on and standing up from a chair with arms ( e.g., wheelchair, bedside commode, etc.)   []  1   []   2   [x]  3   []  4   3.  Moving from lying on back to sitting on the side of the bed?   []  1   []  2   []  3   [x]  4          How much help from another person does the patient currently need... Total A Lot A Little None   4.  Moving to and from a bed to a chair (including a wheelchair)?   []  1   []  2   [x]  3   []  4   5.  Need to walk in hospital room?   []  1   []  2   [x]  3   []  4   6.  Climbing 3-5 steps with a railing?   []  1   []  2   [x]  3   []  4    2007, Trustees of [address], under license to [place], [place]. All rights reserved     Score:  Initial: 20/24 Most Recent: X (Date: 07/26/2024 )   Interpretation of Tool:  Represents activities that are increasingly more difficult (i.e. Bed mobility, Transfers, Gait).  Score 24 23 22-20 19-15 14-10 9-7 6   Modifier CH CI CJ CK CL CM CN         Physical Therapy Evaluation Charge Determination   History Examination Presentation Decision-Making   HIGH Complexity :3+ comorbidities / personal factors will impact the outcome/ POC  HIGH Complexity : 4+ Standardized tests and measures addressing body structure, function, activity limitation and / or participation in recreation  LOW Complexity : Stable, uncomplicated  Other outcome measures ampac 6  MEDIUM      Based on the above components, the patient evaluation is determined to be of the following complexity level: MEDIUM    Pain Rating:  6/10 B/L hip pain  Pain Intervention(s):   nursing notified, rest, elevation, and repositioning    Activity Tolerance:   Good and SpO2 stable on room air    After treatment patient left in no apparent distress:   Bed locked and in lowest position Patient left in no apparent distress sitting up in chair, Call bell within reach, Heels elevated for pressure relief, and Updated patient's board on functional status  and mobility recommendations and nsg updated.    COMMUNICATION/EDUCATION:   The patients plan of care was discussed with: Registered nurse and Case management    Patient  Education  Education Given To: Patient  Education Provided: Plan of Care;Role of Therapy;Home Exercise Program;Transfer Training;Mobility Training;Equipment;Fall Prevention Strategies  Education Provided Comments: Educated on sequencing for bed mobility and transfers, cane usage and safety, therex and discharge recommendations  Education Method: Demonstration;Verbal  Barriers to Learning: None  Education Outcome: Verbalized understanding;Demonstrated understanding;Continued education needed       Thank you for this referral.  Lesta Reedy, PT  Minutes: 20       "

## 2024-07-27 LAB — EKG 12-LEAD
Atrial Rate: 75 {beats}/min
Diagnosis: NORMAL
P Axis: 39 degrees
P-R Interval: 176 ms
Q-T Interval: 364 ms
QRS Duration: 104 ms
QTc Calculation (Bazett): 406 ms
R Axis: 69 degrees
T Axis: 46 degrees
Ventricular Rate: 75 {beats}/min
